# Patient Record
Sex: Female | Born: 1935 | Race: White | Hispanic: No | Smoking: Former smoker
Health system: Southern US, Community
[De-identification: ages and names within clinical notes are randomized; demographics above are authoritative.]

## PROBLEM LIST (undated history)

## (undated) DIAGNOSIS — I82402 Acute embolism and thrombosis of unspecified deep veins of left lower extremity: Secondary | ICD-10-CM

## (undated) DIAGNOSIS — R590 Localized enlarged lymph nodes: Secondary | ICD-10-CM

## (undated) DIAGNOSIS — K219 Gastro-esophageal reflux disease without esophagitis: Secondary | ICD-10-CM

## (undated) DIAGNOSIS — H9319 Tinnitus, unspecified ear: Secondary | ICD-10-CM

## (undated) DIAGNOSIS — M8580 Other specified disorders of bone density and structure, unspecified site: Secondary | ICD-10-CM

## (undated) DIAGNOSIS — E785 Hyperlipidemia, unspecified: Secondary | ICD-10-CM

## (undated) DIAGNOSIS — F329 Major depressive disorder, single episode, unspecified: Secondary | ICD-10-CM

## (undated) DIAGNOSIS — Z0000 Encounter for general adult medical examination without abnormal findings: Secondary | ICD-10-CM

## (undated) DIAGNOSIS — R32 Unspecified urinary incontinence: Secondary | ICD-10-CM

## (undated) DIAGNOSIS — R030 Elevated blood-pressure reading, without diagnosis of hypertension: Secondary | ICD-10-CM

## (undated) DIAGNOSIS — I10 Essential (primary) hypertension: Secondary | ICD-10-CM

## (undated) DIAGNOSIS — G5600 Carpal tunnel syndrome, unspecified upper limb: Secondary | ICD-10-CM

## (undated) DIAGNOSIS — L929 Granulomatous disorder of the skin and subcutaneous tissue, unspecified: Secondary | ICD-10-CM

## (undated) DIAGNOSIS — K5790 Diverticulosis of intestine, part unspecified, without perforation or abscess without bleeding: Secondary | ICD-10-CM

## (undated) DIAGNOSIS — M5126 Other intervertebral disc displacement, lumbar region: Secondary | ICD-10-CM

## (undated) DIAGNOSIS — I2699 Other pulmonary embolism without acute cor pulmonale: Secondary | ICD-10-CM

## (undated) DIAGNOSIS — R351 Nocturia: Secondary | ICD-10-CM

## (undated) DIAGNOSIS — M1990 Unspecified osteoarthritis, unspecified site: Secondary | ICD-10-CM

## (undated) DIAGNOSIS — K5730 Diverticulosis of large intestine without perforation or abscess without bleeding: Secondary | ICD-10-CM

## (undated) DIAGNOSIS — Z8619 Personal history of other infectious and parasitic diseases: Secondary | ICD-10-CM

## (undated) DIAGNOSIS — I700 Atherosclerosis of aorta: Secondary | ICD-10-CM

## (undated) DIAGNOSIS — E039 Hypothyroidism, unspecified: Secondary | ICD-10-CM

## (undated) HISTORY — PX: BREAST SURGERY: SHX581

## (undated) HISTORY — DX: Other specified disorders of bone density and structure, unspecified site: M85.80

## (undated) HISTORY — DX: Major depressive disorder, single episode, unspecified: F32.9

## (undated) HISTORY — PX: APPENDECTOMY: SHX54

## (undated) HISTORY — DX: Essential (primary) hypertension: I10

## (undated) HISTORY — DX: Nocturia: R35.1

## (undated) HISTORY — DX: Personal history of other infectious and parasitic diseases: Z86.19

## (undated) HISTORY — DX: Carpal tunnel syndrome, unspecified upper limb: G56.00

## (undated) HISTORY — DX: Diverticulosis of large intestine without perforation or abscess without bleeding: K57.30

## (undated) HISTORY — DX: Unspecified osteoarthritis, unspecified site: M19.90

## (undated) HISTORY — DX: Gastro-esophageal reflux disease without esophagitis: K21.9

## (undated) HISTORY — DX: Diverticulosis of intestine, part unspecified, without perforation or abscess without bleeding: K57.90

## (undated) HISTORY — DX: Hypothyroidism, unspecified: E03.9

## (undated) HISTORY — DX: Tinnitus, unspecified ear: H93.19

## (undated) HISTORY — DX: Other intervertebral disc displacement, lumbar region: M51.26

## (undated) HISTORY — DX: Hyperlipidemia, unspecified: E78.5

## (undated) HISTORY — DX: Unspecified urinary incontinence: R32

## (undated) HISTORY — DX: Granulomatous disorder of the skin and subcutaneous tissue, unspecified: L92.9

## (undated) HISTORY — PX: DILATION AND CURETTAGE OF UTERUS: SHX78

## (undated) HISTORY — DX: Encounter for general adult medical examination without abnormal findings: Z00.00

## (undated) HISTORY — DX: Elevated blood-pressure reading, without diagnosis of hypertension: R03.0

---
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
## 1978-04-22 HISTORY — PX: ABDOMINAL HYSTERECTOMY: SHX81

## 1998-11-03 ENCOUNTER — Other Ambulatory Visit: Admission: RE | Admit: 1998-11-03 | Discharge: 1998-11-03 | Payer: Self-pay | Admitting: Geriatric Medicine

## 1999-05-03 ENCOUNTER — Encounter: Admission: RE | Admit: 1999-05-03 | Discharge: 1999-05-03 | Payer: Self-pay | Admitting: Geriatric Medicine

## 1999-05-03 ENCOUNTER — Encounter: Payer: Self-pay | Admitting: Geriatric Medicine

## 2000-05-21 ENCOUNTER — Encounter: Payer: Self-pay | Admitting: Geriatric Medicine

## 2000-05-21 ENCOUNTER — Encounter: Admission: RE | Admit: 2000-05-21 | Discharge: 2000-05-21 | Payer: Self-pay | Admitting: Geriatric Medicine

## 2000-12-10 ENCOUNTER — Encounter: Payer: Self-pay | Admitting: Geriatric Medicine

## 2000-12-10 ENCOUNTER — Encounter: Admission: RE | Admit: 2000-12-10 | Discharge: 2000-12-10 | Payer: Self-pay | Admitting: Geriatric Medicine

## 2001-04-09 ENCOUNTER — Ambulatory Visit (HOSPITAL_COMMUNITY): Admission: RE | Admit: 2001-04-09 | Discharge: 2001-04-09 | Payer: Self-pay | Admitting: Gastroenterology

## 2001-05-28 ENCOUNTER — Encounter: Payer: Self-pay | Admitting: Geriatric Medicine

## 2001-05-28 ENCOUNTER — Encounter: Admission: RE | Admit: 2001-05-28 | Discharge: 2001-05-28 | Payer: Self-pay | Admitting: Geriatric Medicine

## 2001-07-21 DIAGNOSIS — F32A Depression, unspecified: Secondary | ICD-10-CM

## 2001-07-21 HISTORY — DX: Depression, unspecified: F32.A

## 2001-07-22 ENCOUNTER — Other Ambulatory Visit: Admission: RE | Admit: 2001-07-22 | Discharge: 2001-07-22 | Payer: Self-pay | Admitting: Geriatric Medicine

## 2001-07-28 ENCOUNTER — Encounter: Payer: Self-pay | Admitting: Geriatric Medicine

## 2001-07-28 ENCOUNTER — Encounter: Admission: RE | Admit: 2001-07-28 | Discharge: 2001-07-28 | Payer: Self-pay | Admitting: Geriatric Medicine

## 2001-11-09 ENCOUNTER — Encounter: Admission: RE | Admit: 2001-11-09 | Discharge: 2001-11-26 | Payer: Self-pay | Admitting: Orthopedic Surgery

## 2002-04-22 HISTORY — PX: KNEE SURGERY: SHX244

## 2002-05-31 ENCOUNTER — Encounter: Payer: Self-pay | Admitting: Geriatric Medicine

## 2002-05-31 ENCOUNTER — Encounter: Admission: RE | Admit: 2002-05-31 | Discharge: 2002-05-31 | Payer: Self-pay | Admitting: Geriatric Medicine

## 2003-06-08 ENCOUNTER — Ambulatory Visit (HOSPITAL_COMMUNITY): Admission: RE | Admit: 2003-06-08 | Discharge: 2003-06-08 | Payer: Self-pay | Admitting: Geriatric Medicine

## 2004-05-09 ENCOUNTER — Encounter: Admission: RE | Admit: 2004-05-09 | Discharge: 2004-05-09 | Payer: Self-pay | Admitting: Geriatric Medicine

## 2004-05-17 ENCOUNTER — Encounter: Admission: RE | Admit: 2004-05-17 | Discharge: 2004-05-17 | Payer: Self-pay | Admitting: Geriatric Medicine

## 2005-05-24 ENCOUNTER — Encounter: Admission: RE | Admit: 2005-05-24 | Discharge: 2005-05-24 | Payer: Self-pay | Admitting: Geriatric Medicine

## 2005-06-13 ENCOUNTER — Encounter: Admission: RE | Admit: 2005-06-13 | Discharge: 2005-06-13 | Payer: Self-pay | Admitting: Geriatric Medicine

## 2006-05-27 ENCOUNTER — Encounter: Admission: RE | Admit: 2006-05-27 | Discharge: 2006-05-27 | Payer: Self-pay | Admitting: Geriatric Medicine

## 2006-12-09 ENCOUNTER — Encounter
Admission: RE | Admit: 2006-12-09 | Discharge: 2007-03-09 | Payer: Self-pay | Admitting: Physical Medicine and Rehabilitation

## 2007-05-29 ENCOUNTER — Encounter: Admission: RE | Admit: 2007-05-29 | Discharge: 2007-05-29 | Payer: Self-pay | Admitting: Geriatric Medicine

## 2008-04-22 HISTORY — PX: CATARACT EXTRACTION: SUR2

## 2008-05-30 ENCOUNTER — Encounter: Admission: RE | Admit: 2008-05-30 | Discharge: 2008-05-30 | Payer: Self-pay | Admitting: Geriatric Medicine

## 2009-05-31 ENCOUNTER — Encounter: Admission: RE | Admit: 2009-05-31 | Discharge: 2009-05-31 | Payer: Self-pay | Admitting: Geriatric Medicine

## 2010-05-12 ENCOUNTER — Encounter: Payer: Self-pay | Admitting: Geriatric Medicine

## 2010-05-12 ENCOUNTER — Other Ambulatory Visit: Payer: Self-pay | Admitting: Geriatric Medicine

## 2010-05-12 DIAGNOSIS — Z9289 Personal history of other medical treatment: Secondary | ICD-10-CM

## 2010-05-12 DIAGNOSIS — Z1231 Encounter for screening mammogram for malignant neoplasm of breast: Secondary | ICD-10-CM

## 2010-06-01 ENCOUNTER — Ambulatory Visit
Admission: RE | Admit: 2010-06-01 | Discharge: 2010-06-01 | Disposition: A | Discharge status: Home / Self Care | Payer: Medicare Other | Source: Ambulatory Visit | Attending: Geriatric Medicine | Admitting: Geriatric Medicine

## 2010-06-01 DIAGNOSIS — Z1231 Encounter for screening mammogram for malignant neoplasm of breast: Secondary | ICD-10-CM

## 2010-07-17 ENCOUNTER — Ambulatory Visit: Payer: Medicare Other | Admitting: Internal Medicine

## 2010-07-17 ENCOUNTER — Ambulatory Visit (HOSPITAL_BASED_OUTPATIENT_CLINIC_OR_DEPARTMENT_OTHER)
Admission: RE | Admit: 2010-07-17 | Discharge: 2010-07-17 | Disposition: A | Discharge status: Home / Self Care | Payer: Medicare Other | Source: Ambulatory Visit | Attending: Internal Medicine | Admitting: Internal Medicine

## 2010-07-17 ENCOUNTER — Other Ambulatory Visit: Payer: Self-pay | Admitting: Internal Medicine

## 2010-07-17 DIAGNOSIS — M899 Disorder of bone, unspecified: Secondary | ICD-10-CM

## 2010-07-17 DIAGNOSIS — F849 Pervasive developmental disorder, unspecified: Secondary | ICD-10-CM

## 2010-07-17 DIAGNOSIS — E041 Nontoxic single thyroid nodule: Secondary | ICD-10-CM

## 2010-07-17 DIAGNOSIS — M949 Disorder of cartilage, unspecified: Secondary | ICD-10-CM

## 2010-07-17 DIAGNOSIS — E785 Hyperlipidemia, unspecified: Secondary | ICD-10-CM

## 2010-07-26 ENCOUNTER — Ambulatory Visit: Payer: Medicare Other | Admitting: Internal Medicine

## 2010-07-26 ENCOUNTER — Other Ambulatory Visit: Payer: Self-pay | Admitting: Internal Medicine

## 2010-07-26 DIAGNOSIS — K210 Gastro-esophageal reflux disease with esophagitis: Secondary | ICD-10-CM

## 2010-07-26 DIAGNOSIS — M899 Disorder of bone, unspecified: Secondary | ICD-10-CM

## 2010-07-26 DIAGNOSIS — E039 Hypothyroidism, unspecified: Secondary | ICD-10-CM

## 2010-07-26 DIAGNOSIS — Z23 Encounter for immunization: Secondary | ICD-10-CM

## 2010-07-26 DIAGNOSIS — M949 Disorder of cartilage, unspecified: Secondary | ICD-10-CM

## 2010-07-26 DIAGNOSIS — E785 Hyperlipidemia, unspecified: Secondary | ICD-10-CM

## 2010-07-31 ENCOUNTER — Ambulatory Visit
Admission: RE | Admit: 2010-07-31 | Discharge: 2010-07-31 | Disposition: A | Discharge status: Home / Self Care | Payer: Medicare Other | Source: Ambulatory Visit | Attending: Internal Medicine | Admitting: Internal Medicine

## 2010-07-31 DIAGNOSIS — M8580 Other specified disorders of bone density and structure, unspecified site: Secondary | ICD-10-CM

## 2010-08-14 ENCOUNTER — Ambulatory Visit: Payer: Medicare Other | Attending: Internal Medicine | Admitting: Physical Therapy

## 2010-08-14 DIAGNOSIS — M6281 Muscle weakness (generalized): Secondary | ICD-10-CM | POA: Insufficient documentation

## 2010-08-14 DIAGNOSIS — R293 Abnormal posture: Secondary | ICD-10-CM | POA: Insufficient documentation

## 2010-08-14 DIAGNOSIS — R262 Difficulty in walking, not elsewhere classified: Secondary | ICD-10-CM | POA: Insufficient documentation

## 2010-08-14 DIAGNOSIS — IMO0001 Reserved for inherently not codable concepts without codable children: Secondary | ICD-10-CM | POA: Insufficient documentation

## 2010-08-20 ENCOUNTER — Ambulatory Visit: Payer: Medicare Other | Admitting: Internal Medicine

## 2010-08-22 ENCOUNTER — Ambulatory Visit: Payer: Medicare Other | Attending: Internal Medicine | Admitting: Physical Therapy

## 2010-08-22 DIAGNOSIS — R262 Difficulty in walking, not elsewhere classified: Secondary | ICD-10-CM | POA: Insufficient documentation

## 2010-08-22 DIAGNOSIS — R293 Abnormal posture: Secondary | ICD-10-CM | POA: Insufficient documentation

## 2010-08-22 DIAGNOSIS — M6281 Muscle weakness (generalized): Secondary | ICD-10-CM | POA: Insufficient documentation

## 2010-08-22 DIAGNOSIS — IMO0001 Reserved for inherently not codable concepts without codable children: Secondary | ICD-10-CM | POA: Insufficient documentation

## 2010-08-27 ENCOUNTER — Ambulatory Visit (INDEPENDENT_AMBULATORY_CARE_PROVIDER_SITE_OTHER): Payer: Medicare Other | Admitting: Internal Medicine

## 2010-08-27 DIAGNOSIS — R32 Unspecified urinary incontinence: Secondary | ICD-10-CM

## 2010-08-27 DIAGNOSIS — M899 Disorder of bone, unspecified: Secondary | ICD-10-CM

## 2010-08-27 DIAGNOSIS — E041 Nontoxic single thyroid nodule: Secondary | ICD-10-CM

## 2010-08-29 ENCOUNTER — Ambulatory Visit: Payer: Medicare Other | Admitting: Physical Therapy

## 2010-09-05 ENCOUNTER — Ambulatory Visit: Payer: Medicare Other | Admitting: Physical Therapy

## 2010-09-10 ENCOUNTER — Ambulatory Visit: Payer: Medicare Other | Admitting: Physical Therapy

## 2010-09-10 ENCOUNTER — Ambulatory Visit: Payer: Medicare Other | Attending: Internal Medicine | Admitting: Physical Therapy

## 2010-09-10 DIAGNOSIS — M629 Disorder of muscle, unspecified: Secondary | ICD-10-CM | POA: Insufficient documentation

## 2010-09-10 DIAGNOSIS — M2420 Disorder of ligament, unspecified site: Secondary | ICD-10-CM | POA: Insufficient documentation

## 2010-09-10 DIAGNOSIS — IMO0001 Reserved for inherently not codable concepts without codable children: Secondary | ICD-10-CM | POA: Insufficient documentation

## 2010-09-12 ENCOUNTER — Encounter: Payer: Medicare Other | Admitting: Physical Therapy

## 2010-09-21 ENCOUNTER — Ambulatory Visit: Payer: Medicare Other | Attending: Internal Medicine | Admitting: Physical Therapy

## 2010-09-21 DIAGNOSIS — M2420 Disorder of ligament, unspecified site: Secondary | ICD-10-CM | POA: Insufficient documentation

## 2010-09-21 DIAGNOSIS — IMO0001 Reserved for inherently not codable concepts without codable children: Secondary | ICD-10-CM | POA: Insufficient documentation

## 2010-09-21 DIAGNOSIS — M629 Disorder of muscle, unspecified: Secondary | ICD-10-CM | POA: Insufficient documentation

## 2010-09-24 ENCOUNTER — Ambulatory Visit: Payer: Medicare Other | Admitting: Physical Therapy

## 2010-10-01 ENCOUNTER — Ambulatory Visit: Payer: Medicare Other | Admitting: Physical Therapy

## 2010-10-08 ENCOUNTER — Ambulatory Visit: Payer: Medicare Other | Admitting: Physical Therapy

## 2010-10-15 ENCOUNTER — Ambulatory Visit: Payer: Medicare Other | Admitting: Physical Therapy

## 2010-10-22 ENCOUNTER — Ambulatory Visit: Payer: Medicare Other | Attending: Internal Medicine | Admitting: Physical Therapy

## 2010-10-22 DIAGNOSIS — IMO0001 Reserved for inherently not codable concepts without codable children: Secondary | ICD-10-CM | POA: Insufficient documentation

## 2010-10-22 DIAGNOSIS — M629 Disorder of muscle, unspecified: Secondary | ICD-10-CM | POA: Insufficient documentation

## 2010-10-22 DIAGNOSIS — M2420 Disorder of ligament, unspecified site: Secondary | ICD-10-CM | POA: Insufficient documentation

## 2010-10-29 ENCOUNTER — Ambulatory Visit: Payer: Medicare Other | Admitting: Physical Therapy

## 2010-11-07 ENCOUNTER — Ambulatory Visit: Payer: Medicare Other | Admitting: Physical Therapy

## 2011-01-01 ENCOUNTER — Encounter: Payer: Self-pay | Admitting: Emergency Medicine

## 2011-01-29 ENCOUNTER — Telehealth: Payer: Self-pay | Admitting: Emergency Medicine

## 2011-01-29 MED ORDER — LEVOTHYROXINE SODIUM 88 MCG PO TABS: 88.0000 ug | ORAL_TABLET | Freq: Every day | ORAL | Status: DC

## 2011-01-29 NOTE — Telephone Encounter (Signed)
Kylie Walters called, requesting refill of levothyroxine.  She states her pharmacy was supposed to send refill request.  I advised her we had not received, but I would refill.  She is out of medication, the pharmacy gave her 2 tabs until medication refilled.  Meds refilled, escribed to [facility]

## 2011-02-26 ENCOUNTER — Other Ambulatory Visit: Payer: Self-pay | Admitting: Emergency Medicine

## 2011-02-26 DIAGNOSIS — E039 Hypothyroidism, unspecified: Secondary | ICD-10-CM

## 2011-02-26 MED ORDER — LEVOTHYROXINE SODIUM 88 MCG PO TABS: 88.0000 ug | ORAL_TABLET | Freq: Every day | ORAL | Status: DC

## 2011-02-26 NOTE — Telephone Encounter (Signed)
Kylie Walters would like to get refills on her levothyroxine.  She would like to take advantage of [facility]'s $15 generic program where she can get a 120 day supply of her medication for $15.  Requests her levothyroxine be refilled for 120 days

## 2011-05-21 ENCOUNTER — Other Ambulatory Visit: Payer: Self-pay | Admitting: Geriatric Medicine

## 2011-05-21 ENCOUNTER — Other Ambulatory Visit: Payer: Self-pay | Admitting: Internal Medicine

## 2011-05-21 DIAGNOSIS — Z1231 Encounter for screening mammogram for malignant neoplasm of breast: Secondary | ICD-10-CM

## 2011-06-18 ENCOUNTER — Ambulatory Visit
Admission: RE | Admit: 2011-06-18 | Discharge: 2011-06-18 | Disposition: A | Discharge status: Home / Self Care | Payer: Medicare Other | Source: Ambulatory Visit | Attending: Internal Medicine | Admitting: Internal Medicine

## 2011-06-18 DIAGNOSIS — Z1231 Encounter for screening mammogram for malignant neoplasm of breast: Secondary | ICD-10-CM

## 2011-10-07 ENCOUNTER — Ambulatory Visit (INDEPENDENT_AMBULATORY_CARE_PROVIDER_SITE_OTHER): Payer: Medicare Other | Admitting: Internal Medicine

## 2011-10-07 ENCOUNTER — Encounter: Payer: Self-pay | Admitting: Internal Medicine

## 2011-10-07 ENCOUNTER — Ambulatory Visit (HOSPITAL_BASED_OUTPATIENT_CLINIC_OR_DEPARTMENT_OTHER)
Admission: RE | Admit: 2011-10-07 | Discharge: 2011-10-07 | Disposition: A | Discharge status: Home / Self Care | Payer: Medicare Other | Source: Ambulatory Visit | Attending: Internal Medicine | Admitting: Internal Medicine

## 2011-10-07 VITALS — BP 130/68 | HR 60 | Temp 97.7°F | Resp 16 | Ht 64.5 in | Wt 159.0 lb

## 2011-10-07 DIAGNOSIS — E041 Nontoxic single thyroid nodule: Secondary | ICD-10-CM | POA: Insufficient documentation

## 2011-10-07 DIAGNOSIS — E559 Vitamin D deficiency, unspecified: Secondary | ICD-10-CM | POA: Diagnosis not present

## 2011-10-07 DIAGNOSIS — K5730 Diverticulosis of large intestine without perforation or abscess without bleeding: Secondary | ICD-10-CM

## 2011-10-07 DIAGNOSIS — M899 Disorder of bone, unspecified: Secondary | ICD-10-CM

## 2011-10-07 DIAGNOSIS — K5792 Diverticulitis of intestine, part unspecified, without perforation or abscess without bleeding: Secondary | ICD-10-CM

## 2011-10-07 DIAGNOSIS — M8580 Other specified disorders of bone density and structure, unspecified site: Secondary | ICD-10-CM | POA: Insufficient documentation

## 2011-10-07 DIAGNOSIS — K5732 Diverticulitis of large intestine without perforation or abscess without bleeding: Secondary | ICD-10-CM

## 2011-10-07 DIAGNOSIS — E785 Hyperlipidemia, unspecified: Secondary | ICD-10-CM | POA: Insufficient documentation

## 2011-10-07 DIAGNOSIS — J8410 Pulmonary fibrosis, unspecified: Secondary | ICD-10-CM

## 2011-10-07 DIAGNOSIS — M1990 Unspecified osteoarthritis, unspecified site: Secondary | ICD-10-CM | POA: Insufficient documentation

## 2011-10-07 DIAGNOSIS — IMO0002 Reserved for concepts with insufficient information to code with codable children: Secondary | ICD-10-CM | POA: Insufficient documentation

## 2011-10-07 DIAGNOSIS — Z803 Family history of malignant neoplasm of breast: Secondary | ICD-10-CM

## 2011-10-07 DIAGNOSIS — K219 Gastro-esophageal reflux disease without esophagitis: Secondary | ICD-10-CM | POA: Insufficient documentation

## 2011-10-07 DIAGNOSIS — E049 Nontoxic goiter, unspecified: Secondary | ICD-10-CM | POA: Diagnosis not present

## 2011-10-07 DIAGNOSIS — E039 Hypothyroidism, unspecified: Secondary | ICD-10-CM | POA: Insufficient documentation

## 2011-10-07 DIAGNOSIS — Z90710 Acquired absence of both cervix and uterus: Secondary | ICD-10-CM

## 2011-10-07 DIAGNOSIS — H9190 Unspecified hearing loss, unspecified ear: Secondary | ICD-10-CM | POA: Insufficient documentation

## 2011-10-07 DIAGNOSIS — Z8659 Personal history of other mental and behavioral disorders: Secondary | ICD-10-CM | POA: Insufficient documentation

## 2011-10-07 DIAGNOSIS — M949 Disorder of cartilage, unspecified: Secondary | ICD-10-CM | POA: Diagnosis not present

## 2011-10-07 HISTORY — DX: Diverticulosis of large intestine without perforation or abscess without bleeding: K57.30

## 2011-10-07 LAB — POCT URINALYSIS DIPSTICK
Glucose, UA: NEGATIVE
Leukocytes, UA: NEGATIVE
Spec Grav, UA: 1.01
Urobilinogen, UA: >=8

## 2011-10-07 MED ORDER — SULFAMETHOXAZOLE-TRIMETHOPRIM 800-160 MG PO TABS: 1.0000 | ORAL_TABLET | Freq: Two times a day (BID) | ORAL | Status: AC

## 2011-10-07 NOTE — Patient Instructions (Addendum)
Labs will be mailed to you 

## 2011-10-07 NOTE — Progress Notes (Signed)
Subjective:    Patient ID: Kylie Walters, female    DOB: Mar 23, 1925, 78 y.o.   MRN: [id number]  HPI Kylie Walters is here for comprehensive eval.  Overall doing well but blowing yellow mucous out of her nose.  Occasional cough white mucous.  She is not using flonase regularly.  No fever no chest pain no SOB. She does have itchy eyes but they are better now.    She repeatedly declines colonoscopy and states that she would not want any treatment for colon cancer if it was found.    She is UTD with mammogram.  She reports she is not taking ASA daily  Allergies  Allergen Reactions  . Lipitor (Atorvastatin Calcium) Other (See Comments)    Myalgia    Past Medical History  Diagnosis Date  . Hypothyroidism   . Osteopenia   . Diverticulosis   . Granuloma of skin     RLL  . Vitamin d deficiency   . GERD (gastroesophageal reflux disease)   . Hyperlipidemia   . Hearing loss   . Arthritis   . Depression 4/03  . Lumbar herniated disc    Past Surgical History  Procedure Date  . Tonsillectomy and adenoidectomy 1939  . Dilation and curettage of uterus 1954, 1978, 1979  . Abdominal hysterectomy 1980  . Knee surgery 2004    left knee  . Cataract extraction 2010  . Breast surgery     L breast cyst   History   Social History  . Marital Status: Widowed    Spouse Name: N/A    Number of Children: N/A  . Years of Education: N/A   Occupational History  . Not on file.   Social History Main Topics  . Smoking status: Former Smoker    Quit date: 04/23/1979  . Smokeless tobacco: Not on file  . Alcohol Use: Yes     socially  . Drug Use: No  . Sexually Active: Not Currently   Other Topics Concern  . Not on file   Social History Narrative  . No narrative on file   Family History  Problem Relation Age of Onset  . Breast cancer Mother     metastatic disease  . Kidney disease Father     Bright's disease  . Cancer Maternal Grandfather     prostate   There is no problem list on file  for this patient.  Current Outpatient Prescriptions on File Prior to Visit  Medication Sig Dispense Refill  . fluticasone (FLONASE) 50 MCG/ACT nasal spray Place 1 spray into the nose daily as needed.        Marland Kitchen ibuprofen (ADVIL,MOTRIN) 200 MG tablet Take 400 mg by mouth every 6 (six) hours as needed.        Marland Kitchen levothyroxine (SYNTHROID, LEVOTHROID) 88 MCG tablet Take 1 tablet (88 mcg total) by mouth daily.  120 tablet  2  . aspirin 81 MG tablet Take 81 mg by mouth daily.        . Calcium Carbonate-Vitamin D (CALCIUM 500 + D PO) Take 1 tablet by mouth daily.        . Cholecalciferol (VITAMIN D3) 2000 UNITS TABS Take 1 tablet by mouth daily.        Marland Kitchen CRANBERRY EXTRACT PO Take 1 tablet by mouth daily.        . Multiple Vitamin (MULTIVITAMIN) tablet Take 1 tablet by mouth daily.        Marland Kitchen nystatin-triamcinolone (MYCOLOG II) cream Apply 1 application topically  2 (two) times daily as needed.              Review of Systems See HPI    Objective:   Physical Exam Physical Exam  Nursing note and vitals reviewed.  Constitutional: She is oriented to person, place, and time. She appears well-developed and well-nourished.  HENT:  Head: Normocephalic and atraumatic.  Right Ear: Tympanic membrane and ear canal normal. No drainage. Tympanic membrane is not injected and not erythematous.  Left Ear: Tympanic membrane and ear canal normal. No drainage. Tympanic membrane is not injected and not erythematous.  Nose: Nose normal. Right sinus exhibits no maxillary sinus tenderness and no frontal sinus tenderness. Left sinus exhibits no maxillary sinus tenderness and no frontal sinus tenderness.  Mouth/Throat: Oropharynx is clear and moist. No oral lesions. No oropharyngeal exudate.  Eyes: Conjunctivae and EOM are normal. Pupils are equal, round, and reactive to light.  Neck: Normal range of motion. Neck supple. No JVD present. Carotid bruit is not present. No mass and no thyromegaly present.  Cardiovascular:  Normal rate, regular rhythm, S1 normal, S2 normal and intact distal pulses. Exam reveals no gallop and no friction rub.  No murmur heard.  Pulses:  Carotid pulses are 2+ on the right side, and 2+ on the left side.  Dorsalis pedis pulses are 2+ on the right side, and 2+ on the left side.  No carotid bruit. No LE edema  Pulmonary/Chest: Breath sounds normal. She has no wheezes. She has no rales. She exhibits no tenderness.   Breast  No discrete masses no nipple discharge no axillary adenopathy bilaterally. Abdominal: Soft. Bowel sounds are normal. She exhibits no distension and no mass. There is no hepatosplenomegaly. There is no tenderness. There is no CVA tenderness.   Rectal no mass guaiac neg. Musculoskeletal: Normal range of motion.  No active synovitis to joints.  Lymphadenopathy:  She has no cervical adenopathy.  She has no axillary adenopathy.  Right: No inguinal and no supraclavicular adenopathy present.  Left: No inguinal and no supraclavicular adenopathy present.  Neurological: She is alert and oriented to person, place, and time. She has normal strength and normal reflexes. She displays no tremor. No cranial nerve deficit or sensory deficit. Coordination and gait normal.  Skin: Skin is warm and dry. No rash noted. No cyanosis. Nails show no clubbing.  Psychiatric: She has a normal mood and affect. Her speech is normal and behavior is normal. Cognition and memory are normal.        Assessment & Plan:  Hyppothyroidism:  Will check TSH today   Thyroid cyst  L side:  Will get repeat ultrasound today  Sinusitis/ allegic rhinitis  Will give Bactrim DS i tab bid 7 days, advised to use Flonase  Hyperlipdemia  Recheck today.  Advised to take 81 mg ASA daily or every other day.  Osteopenia  Recheck  DEXA  next year  Advised Calcium and vitamin D  Diverticulosis  Vitamin D deficinecy will check today  GERD    DJD  Allergic rhinitis  Pt repeatedly declines colonoscopy despite  counseld and Decline Zoster vaccine.  UTD with mammogram    I spent 45 minutes with this pt

## 2011-10-08 ENCOUNTER — Telehealth: Payer: Self-pay | Admitting: *Deleted

## 2011-10-08 LAB — LIPID PANEL
Cholesterol: 205 mg/dL — ABNORMAL HIGH (ref 0–200)
Total CHOL/HDL Ratio: 4.3 Ratio
Triglycerides: 96 mg/dL (ref <150)
VLDL: 19 mg/dL (ref 0–40)

## 2011-10-08 LAB — COMPREHENSIVE METABOLIC PANEL
CO2: 25 mEq/L (ref 19–32)
Calcium: 9.4 mg/dL (ref 8.4–10.5)
Chloride: 107 mEq/L (ref 96–112)
Creat: 0.59 mg/dL (ref 0.50–1.10)
Glucose, Bld: 86 mg/dL (ref 70–99)
Total Bilirubin: 0.5 mg/dL (ref 0.3–1.2)
Total Protein: 6.1 g/dL (ref 6.0–8.3)

## 2011-10-08 LAB — CBC WITH DIFFERENTIAL/PLATELET
Eosinophils Absolute: 0.1 10*3/uL (ref 0.0–0.7)
Eosinophils Relative: 2 % (ref 0–5)
HCT: 41.8 % (ref 36.0–46.0)
Hemoglobin: 13.6 g/dL (ref 12.0–15.0)
Lymphocytes Relative: 42 % (ref 12–46)
Lymphs Abs: 2.5 10*3/uL (ref 0.7–4.0)
MCH: 29.3 pg (ref 26.0–34.0)
MCV: 90.1 fL (ref 78.0–100.0)
Monocytes Relative: 9 % (ref 3–12)
RBC: 4.64 MIL/uL (ref 3.87–5.11)

## 2011-10-08 LAB — VITAMIN D 25 HYDROXY (VIT D DEFICIENCY, FRACTURES): Vit D, 25-Hydroxy: 28 ng/mL — ABNORMAL LOW (ref 30–89)

## 2011-10-08 NOTE — Telephone Encounter (Signed)
Pt notified of thyroid scan being stable and she does want a referral to an endocrinologist.

## 2011-10-09 ENCOUNTER — Telehealth: Payer: Self-pay | Admitting: Internal Medicine

## 2011-10-09 ENCOUNTER — Telehealth: Payer: Self-pay | Admitting: *Deleted

## 2011-10-09 DIAGNOSIS — E039 Hypothyroidism, unspecified: Secondary | ICD-10-CM

## 2011-10-09 NOTE — Telephone Encounter (Signed)
Copy of labs along with a copy of [facility] diet mailed to pt's home address.

## 2011-11-04 ENCOUNTER — Encounter: Payer: Self-pay | Admitting: *Deleted

## 2011-11-13 NOTE — Telephone Encounter (Signed)
error 

## 2011-12-02 DIAGNOSIS — E039 Hypothyroidism, unspecified: Secondary | ICD-10-CM | POA: Diagnosis not present

## 2011-12-02 DIAGNOSIS — E041 Nontoxic single thyroid nodule: Secondary | ICD-10-CM | POA: Diagnosis not present

## 2012-01-05 ENCOUNTER — Encounter: Payer: Self-pay | Admitting: Internal Medicine

## 2012-01-08 ENCOUNTER — Other Ambulatory Visit: Payer: Self-pay | Admitting: Internal Medicine

## 2012-01-08 NOTE — Telephone Encounter (Signed)
Pt needs refill on Fluticasone Propionate (Suspension) FLONASE 50 MCG/ACT Place 1 spray into the nose daily as needed... The one she has already expired on 08/ 2013...   [facility] to [facility]... Pt stopped by on 12/29/11 @ 305 pm...   She will back on 01/13/2012 to get her flu shot... Ad

## 2012-01-09 ENCOUNTER — Other Ambulatory Visit: Payer: Self-pay | Admitting: Internal Medicine

## 2012-01-09 MED ORDER — FLUTICASONE PROPIONATE 50 MCG/ACT NA SUSP: 1.0000 | Freq: Every day | NASAL | Status: DC | PRN

## 2012-01-09 NOTE — Telephone Encounter (Signed)
Pt requesting refill of nasal spray

## 2012-01-13 ENCOUNTER — Ambulatory Visit (INDEPENDENT_AMBULATORY_CARE_PROVIDER_SITE_OTHER): Payer: Medicare Other | Admitting: Internal Medicine

## 2012-01-13 ENCOUNTER — Ambulatory Visit: Payer: Medicare Other

## 2012-01-13 VITALS — Temp 97.0°F

## 2012-01-13 DIAGNOSIS — Z23 Encounter for immunization: Secondary | ICD-10-CM

## 2012-02-28 DIAGNOSIS — H43399 Other vitreous opacities, unspecified eye: Secondary | ICD-10-CM | POA: Diagnosis not present

## 2012-03-02 ENCOUNTER — Other Ambulatory Visit: Payer: Self-pay | Admitting: *Deleted

## 2012-03-02 ENCOUNTER — Ambulatory Visit (INDEPENDENT_AMBULATORY_CARE_PROVIDER_SITE_OTHER): Payer: Medicare Other | Admitting: Internal Medicine

## 2012-03-02 ENCOUNTER — Encounter: Payer: Self-pay | Admitting: Internal Medicine

## 2012-03-02 VITALS — BP 168/74 | HR 59 | Temp 97.0°F | Resp 18 | Wt 159.0 lb

## 2012-03-02 DIAGNOSIS — H6590 Unspecified nonsuppurative otitis media, unspecified ear: Secondary | ICD-10-CM

## 2012-03-02 DIAGNOSIS — J029 Acute pharyngitis, unspecified: Secondary | ICD-10-CM | POA: Diagnosis not present

## 2012-03-02 MED ORDER — AZITHROMYCIN 250 MG PO TABS: ORAL_TABLET | ORAL | Status: DC

## 2012-03-02 NOTE — Progress Notes (Signed)
Subjective:    Patient ID: Kylie Walters, female    DOB: [birth year], 78 y.o.   MRN: [id number]  HPI Kylie Walters is here for acute visit.  She has had a sore throat for several days.  No fever no chest pain occasional dry cough  Allergies  Allergen Reactions  . Lipitor (Atorvastatin Calcium) Other (See Comments)    Myalgia    Past Medical History  Diagnosis Date  . Hypothyroidism   . Osteopenia   . Diverticulosis   . Granuloma of skin     RLL  . Vitamin D deficiency   . GERD (gastroesophageal reflux disease)   . Hyperlipidemia   . Hearing loss   . Arthritis   . Depression 4/03  . Lumbar herniated disc    Past Surgical History  Procedure Date  . Tonsillectomy and adenoidectomy 1939  . Dilation and curettage of uterus 1954, 1978, 1979  . Abdominal hysterectomy 1980  . Knee surgery 2004    left knee  . Cataract extraction 2010  . Breast surgery     L breast cyst   History   Social History  . Marital Status: Widowed    Spouse Name: N/A    Number of Children: N/A  . Years of Education: N/A   Occupational History  . Not on file.   Social History Main Topics  . Smoking status: Former Smoker    Quit date: 04/23/1979  . Smokeless tobacco: Not on file  . Alcohol Use: Yes     Comment: socially  . Drug Use: No  . Sexually Active: Not Currently   Other Topics Concern  . Not on file   Social History Narrative  . No narrative on file   Family History  Problem Relation Age of Onset  . Breast cancer Mother     metastatic disease  . Kidney disease Father     Bright's disease  . Cancer Maternal Grandfather     prostate   Patient Active Problem List  Diagnosis  . Unspecified hypothyroidism  . Osteopenia  . Diverticulitis  . Lung granuloma  . Vitamin d deficiency  . GERD (gastroesophageal reflux disease)  . Other and unspecified hyperlipidemia  . Hearing loss  . DJD (degenerative joint disease)  . History of depression  . Disc herniation  . H/O hysterectomy  with oophorectomy  . Family history of breast cancer in first degree relative  . Thyroid cyst   Current Outpatient Prescriptions on File Prior to Visit  Medication Sig Dispense Refill  . Calcium Carbonate-Vitamin D (CALCIUM 500 + D PO) Take 1 tablet by mouth daily.        . Cholecalciferol (VITAMIN D3) 2000 UNITS TABS Take 1 tablet by mouth daily.        . fluticasone (FLONASE) 50 MCG/ACT nasal spray Place 1 spray into the nose daily as needed.  16 g  1  . ibuprofen (ADVIL,MOTRIN) 200 MG tablet Take 400 mg by mouth every 6 (six) hours as needed.        Marland Kitchen levothyroxine (SYNTHROID, LEVOTHROID) 88 MCG tablet Take 1 tablet (88 mcg total) by mouth daily.  120 tablet  2  . Multiple Vitamin (MULTIVITAMIN) tablet Take 1 tablet by mouth daily.        Marland Kitchen aspirin 81 MG tablet Take 81 mg by mouth daily.        Marland Kitchen CRANBERRY EXTRACT PO Take 1 tablet by mouth daily.        Marland Kitchen nystatin-triamcinolone (MYCOLOG II)  cream Apply 1 application topically 2 (two) times daily as needed.             Review of Systems See HPI    Objective:   Physical Exam Physical Exam  Constitutional: She is oriented to person, place, and time. She appears well-developed and well-nourished. She is cooperative.  HENT:  Head: Normocephalic and atraumatic.  Right Ear: A middle ear effusion is present.  Left Ear: A middle ear effusion is present.  Nose: Mucosal edema present.  Mouth/Throat: Oropharyngeal exudate and posterior oropharyngeal erythema present.  Serous effusion bilaterally  Eyes: Conjunctivae and EOM are normal. Pupils are equal, round, and reactive to light.  Neck: Neck supple. Carotid bruit is not present. No mass present.  Cardiovascular: Regular rhythm, normal heart sounds, intact distal pulses and normal pulses. Exam reveals no gallop and no friction rub.  No murmur heard.  Pulmonary/Chest: Breath sounds normal. She has no wheezes. She has no rhonchi. She has no rales.  Lymphadenopathy:  She has cervical  adenopathy.  Neurological: She is alert and oriented to person, place, and time.  Skin: Skin is warm and dry. No abrasion, no bruising, no ecchymosis and no rash noted. No cyanosis. Nails show no clubbing.  Psychiatric: She has a normal mood and affect. Her speech is normal and behavior is normal.            Assessment & Plan:  Pharyngitis  Will give Zpak   Congestion  Ok to take Mucinex otc  I adcised to do CXR but pt declines due to expense  Serous effusion  Continue flonase  Pt counseled if no improvement will need ENT eval.  She voices understanding and is to call office if not better

## 2012-03-02 NOTE — Patient Instructions (Addendum)
See me as needed 

## 2012-03-09 ENCOUNTER — Telehealth: Payer: Self-pay | Admitting: Internal Medicine

## 2012-03-09 NOTE — Telephone Encounter (Signed)
Pt was calling to inform Dr. Reece Levy and Karen Kitchens that she has finished taking her antibiotic.Marland Kitchen She does not feel bad, but everything still the same... For example her throat still has the yellowish stuff inside... Please call pt at [phone].Marland KitchenMarland Kitchen

## 2012-03-09 NOTE — Telephone Encounter (Signed)
Called pt regarding her sx pt is scheduled to see MD

## 2012-03-10 ENCOUNTER — Encounter: Payer: Self-pay | Admitting: Internal Medicine

## 2012-03-10 ENCOUNTER — Ambulatory Visit (INDEPENDENT_AMBULATORY_CARE_PROVIDER_SITE_OTHER): Payer: Medicare Other | Admitting: Internal Medicine

## 2012-03-10 VITALS — BP 128/70 | HR 66 | Temp 97.1°F | Resp 18 | Wt 157.0 lb

## 2012-03-10 DIAGNOSIS — S0300XA Dislocation of jaw, unspecified side, initial encounter: Secondary | ICD-10-CM

## 2012-03-10 DIAGNOSIS — M26629 Arthralgia of temporomandibular joint, unspecified side: Secondary | ICD-10-CM | POA: Diagnosis not present

## 2012-03-10 DIAGNOSIS — R6884 Jaw pain: Secondary | ICD-10-CM

## 2012-03-10 DIAGNOSIS — H9209 Otalgia, unspecified ear: Secondary | ICD-10-CM | POA: Diagnosis not present

## 2012-03-10 NOTE — Progress Notes (Signed)
Subjective:    Patient ID: Kylie Walters, female    DOB: 04-15-1925, 78 y.o.   MRN: [id number]  HPI Kylie Walters is here for follow up  She states she has been having pain in L TMJ area and still has nasal  Congestion.  She is feeling somewhat better but would like to have an ENT evaluation.  No fever or headache  She has finished her Zpak  Taking an occasional mucinex D  Allergies  Allergen Reactions  . Lipitor (Atorvastatin Calcium) Other (See Comments)    Myalgia    Past Medical History  Diagnosis Date  . Hypothyroidism   . Osteopenia   . Diverticulosis   . Granuloma of skin     RLL  . Vitamin D deficiency   . GERD (gastroesophageal reflux disease)   . Hyperlipidemia   . Hearing loss   . Arthritis   . Depression 4/03  . Lumbar herniated disc    Past Surgical History  Procedure Date  . Tonsillectomy and adenoidectomy 1939  . Dilation and curettage of uterus 1954, 1978, 1979  . Abdominal hysterectomy 1980  . Knee surgery 2004    left knee  . Cataract extraction 2010  . Breast surgery     L breast cyst   History   Social History  . Marital Status: Widowed    Spouse Name: N/A    Number of Children: N/A  . Years of Education: N/A   Occupational History  . Not on file.   Social History Main Topics  . Smoking status: Former Smoker    Quit date: 04/23/1979  . Smokeless tobacco: Not on file  . Alcohol Use: Yes     Comment: socially  . Drug Use: No  . Sexually Active: Not Currently   Other Topics Concern  . Not on file   Social History Narrative  . No narrative on file   Family History  Problem Relation Age of Onset  . Breast cancer Mother     metastatic disease  . Kidney disease Father     Bright's disease  . Cancer Maternal Grandfather     prostate   Patient Active Problem List  Diagnosis  . Unspecified hypothyroidism  . Osteopenia  . Diverticulitis  . Lung granuloma  . Vitamin d deficiency  . GERD (gastroesophageal reflux disease)  . Other and  unspecified hyperlipidemia  . Hearing loss  . DJD (degenerative joint disease)  . History of depression  . Disc herniation  . H/O hysterectomy with oophorectomy  . Family history of breast cancer in first degree relative  . Thyroid cyst  . TMJ arthralgia   Current Outpatient Prescriptions on File Prior to Visit  Medication Sig Dispense Refill  . aspirin 81 MG tablet Take 81 mg by mouth daily.        . Calcium Carbonate-Vitamin D (CALCIUM 500 + D PO) Take 1 tablet by mouth daily.        . Cholecalciferol (VITAMIN D3) 2000 UNITS TABS Take 1 tablet by mouth daily.        Marland Kitchen CRANBERRY EXTRACT PO Take 1 tablet by mouth daily.        . fluticasone (FLONASE) 50 MCG/ACT nasal spray Place 1 spray into the nose daily as needed.  16 g  1  . ibuprofen (ADVIL,MOTRIN) 200 MG tablet Take 400 mg by mouth every 6 (six) hours as needed.        Marland Kitchen levothyroxine (SYNTHROID, LEVOTHROID) 88 MCG tablet Take 1 tablet (  88 mcg total) by mouth daily.  120 tablet  2  . Multiple Vitamin (MULTIVITAMIN) tablet Take 1 tablet by mouth daily.        Marland Kitchen nystatin-triamcinolone (MYCOLOG II) cream Apply 1 application topically 2 (two) times daily as needed.             Review of Systems    see HPI Objective:   Physical Exam Physical Exam  Constitutional: She is oriented to person, place, and time. She appears well-developed and well-nourished. She is cooperative.  HENT:  Head: Normocephalic and atraumatic.  Right Ear: A middle ear effusion is present.  Left Ear: A middle ear effusion is present.  Nose: Mucosal edema present. Right sinus exhibits maxillary sinus tenderness. Left sinus exhibits maxillary sinus tenderness.  Mouth/Throat: Posterior oropharyngeal erythema present.  Serous effusion bilaterally  Slight TMJ clicking with movement Eyes: Conjunctivae and EOM are normal. Pupils are equal, round, and reactive to light.  Neck: Neck supple. Carotid bruit is not present. No mass present.  Cardiovascular: Regular  rhythm, normal heart sounds, intact distal pulses and normal pulses. Exam reveals no gallop and no friction rub.  No murmur heard.  Pulmonary/Chest: Breath sounds normal. She has no wheezes. She has no rhonchi. She has no rales.  Neurological: She is alert and oriented to person, place, and time.  Skin: Skin is warm and dry. No abrasion, no bruising, no ecchymosis and no rash noted. No cyanosis. Nails show no clubbing.  Psychiatric: She has a normal mood and affect. Her speech is normal and behavior is normal.             Assessment & Plan:  Eustachian tube dysfunction  Continue Mucinex D  OK for ENT eval  Possible TMJ

## 2012-03-13 DIAGNOSIS — K219 Gastro-esophageal reflux disease without esophagitis: Secondary | ICD-10-CM | POA: Diagnosis not present

## 2012-03-13 DIAGNOSIS — R0982 Postnasal drip: Secondary | ICD-10-CM | POA: Diagnosis not present

## 2012-04-02 ENCOUNTER — Other Ambulatory Visit: Payer: Self-pay | Admitting: *Deleted

## 2012-04-02 DIAGNOSIS — E039 Hypothyroidism, unspecified: Secondary | ICD-10-CM

## 2012-04-02 MED ORDER — LEVOTHYROXINE SODIUM 88 MCG PO TABS: 88.0000 ug | ORAL_TABLET | Freq: Every day | ORAL | Status: DC

## 2012-04-02 NOTE — Telephone Encounter (Signed)
Refill request

## 2012-05-04 ENCOUNTER — Telehealth: Payer: Self-pay | Admitting: Internal Medicine

## 2012-05-04 NOTE — Telephone Encounter (Signed)
Pt requesting #[phone] because it is much cheaper it is to be sent to [facility]

## 2012-05-04 NOTE — Telephone Encounter (Signed)
Pt request a call from the nurse to talk about a prescription.. She did not say what prescription (sorry).. Please call pt at [phone]

## 2012-05-08 ENCOUNTER — Other Ambulatory Visit: Payer: Self-pay | Admitting: Otolaryngology

## 2012-05-08 DIAGNOSIS — R0982 Postnasal drip: Secondary | ICD-10-CM

## 2012-05-11 ENCOUNTER — Ambulatory Visit
Admission: RE | Admit: 2012-05-11 | Discharge: 2012-05-11 | Disposition: A | Discharge status: Home / Self Care | Payer: Medicare Other | Source: Ambulatory Visit | Attending: Otolaryngology | Admitting: Otolaryngology

## 2012-05-11 DIAGNOSIS — J3489 Other specified disorders of nose and nasal sinuses: Secondary | ICD-10-CM | POA: Diagnosis not present

## 2012-05-11 DIAGNOSIS — R0982 Postnasal drip: Secondary | ICD-10-CM

## 2012-05-13 ENCOUNTER — Other Ambulatory Visit: Payer: Self-pay | Admitting: Internal Medicine

## 2012-05-13 DIAGNOSIS — Z1231 Encounter for screening mammogram for malignant neoplasm of breast: Secondary | ICD-10-CM

## 2012-06-18 ENCOUNTER — Ambulatory Visit (HOSPITAL_BASED_OUTPATIENT_CLINIC_OR_DEPARTMENT_OTHER)
Admission: RE | Admit: 2012-06-18 | Discharge: 2012-06-18 | Disposition: A | Discharge status: Home / Self Care | Payer: Medicare Other | Source: Ambulatory Visit | Attending: Internal Medicine | Admitting: Internal Medicine

## 2012-06-18 DIAGNOSIS — Z1231 Encounter for screening mammogram for malignant neoplasm of breast: Secondary | ICD-10-CM | POA: Diagnosis not present

## 2012-10-08 ENCOUNTER — Telehealth: Payer: Self-pay | Admitting: *Deleted

## 2012-10-08 DIAGNOSIS — M25549 Pain in joints of unspecified hand: Secondary | ICD-10-CM | POA: Diagnosis not present

## 2012-10-22 DIAGNOSIS — G5600 Carpal tunnel syndrome, unspecified upper limb: Secondary | ICD-10-CM | POA: Diagnosis not present

## 2012-11-05 DIAGNOSIS — G5600 Carpal tunnel syndrome, unspecified upper limb: Secondary | ICD-10-CM | POA: Diagnosis not present

## 2012-11-16 ENCOUNTER — Telehealth: Payer: Self-pay | Admitting: *Deleted

## 2012-11-16 ENCOUNTER — Ambulatory Visit (HOSPITAL_BASED_OUTPATIENT_CLINIC_OR_DEPARTMENT_OTHER)
Admission: RE | Admit: 2012-11-16 | Discharge: 2012-11-16 | Disposition: A | Discharge status: Home / Self Care | Payer: Medicare Other | Source: Ambulatory Visit | Attending: Internal Medicine | Admitting: Internal Medicine

## 2012-11-16 ENCOUNTER — Other Ambulatory Visit: Payer: Self-pay | Admitting: Internal Medicine

## 2012-11-16 ENCOUNTER — Ambulatory Visit (INDEPENDENT_AMBULATORY_CARE_PROVIDER_SITE_OTHER): Payer: Medicare Other | Admitting: Internal Medicine

## 2012-11-16 ENCOUNTER — Encounter: Payer: Self-pay | Admitting: Internal Medicine

## 2012-11-16 VITALS — BP 128/74 | HR 57 | Temp 97.8°F | Resp 16 | Wt 141.0 lb

## 2012-11-16 DIAGNOSIS — I700 Atherosclerosis of aorta: Secondary | ICD-10-CM | POA: Diagnosis not present

## 2012-11-16 DIAGNOSIS — M8580 Other specified disorders of bone density and structure, unspecified site: Secondary | ICD-10-CM

## 2012-11-16 DIAGNOSIS — M949 Disorder of cartilage, unspecified: Secondary | ICD-10-CM | POA: Diagnosis not present

## 2012-11-16 DIAGNOSIS — I2510 Atherosclerotic heart disease of native coronary artery without angina pectoris: Secondary | ICD-10-CM | POA: Insufficient documentation

## 2012-11-16 DIAGNOSIS — R001 Bradycardia, unspecified: Secondary | ICD-10-CM | POA: Insufficient documentation

## 2012-11-16 DIAGNOSIS — R918 Other nonspecific abnormal finding of lung field: Secondary | ICD-10-CM | POA: Diagnosis not present

## 2012-11-16 DIAGNOSIS — E785 Hyperlipidemia, unspecified: Secondary | ICD-10-CM

## 2012-11-16 DIAGNOSIS — M899 Disorder of bone, unspecified: Secondary | ICD-10-CM

## 2012-11-16 DIAGNOSIS — I498 Other specified cardiac arrhythmias: Secondary | ICD-10-CM

## 2012-11-16 DIAGNOSIS — F17200 Nicotine dependence, unspecified, uncomplicated: Secondary | ICD-10-CM | POA: Insufficient documentation

## 2012-11-16 DIAGNOSIS — N6009 Solitary cyst of unspecified breast: Secondary | ICD-10-CM | POA: Diagnosis not present

## 2012-11-16 DIAGNOSIS — Z139 Encounter for screening, unspecified: Secondary | ICD-10-CM

## 2012-11-16 DIAGNOSIS — M1990 Unspecified osteoarthritis, unspecified site: Secondary | ICD-10-CM | POA: Diagnosis not present

## 2012-11-16 DIAGNOSIS — Z720 Tobacco use: Secondary | ICD-10-CM

## 2012-11-16 DIAGNOSIS — J984 Other disorders of lung: Secondary | ICD-10-CM | POA: Diagnosis not present

## 2012-11-16 DIAGNOSIS — E559 Vitamin D deficiency, unspecified: Secondary | ICD-10-CM

## 2012-11-16 DIAGNOSIS — Z0000 Encounter for general adult medical examination without abnormal findings: Secondary | ICD-10-CM

## 2012-11-16 DIAGNOSIS — R9389 Abnormal findings on diagnostic imaging of other specified body structures: Secondary | ICD-10-CM

## 2012-11-16 DIAGNOSIS — IMO0002 Reserved for concepts with insufficient information to code with codable children: Secondary | ICD-10-CM

## 2012-11-16 DIAGNOSIS — I517 Cardiomegaly: Secondary | ICD-10-CM | POA: Insufficient documentation

## 2012-11-16 DIAGNOSIS — E039 Hypothyroidism, unspecified: Secondary | ICD-10-CM

## 2012-11-16 DIAGNOSIS — J8410 Pulmonary fibrosis, unspecified: Secondary | ICD-10-CM

## 2012-11-16 LAB — CBC WITH DIFFERENTIAL/PLATELET
Eosinophils Relative: 2 % (ref 0–5)
Lymphocytes Relative: 34 % (ref 12–46)
Lymphs Abs: 2.3 10*3/uL (ref 0.7–4.0)
MCV: 86.3 fL (ref 78.0–100.0)
Neutro Abs: 3.5 10*3/uL (ref 1.7–7.7)
Neutrophils Relative %: 52 % (ref 43–77)
Platelets: 368 10*3/uL (ref 150–400)
RBC: 4.32 MIL/uL (ref 3.87–5.11)
WBC: 6.8 10*3/uL (ref 4.0–10.5)

## 2012-11-16 LAB — COMPREHENSIVE METABOLIC PANEL
ALT: 10 U/L (ref 0–35)
AST: 14 U/L (ref 0–37)
CO2: 28 mEq/L (ref 19–32)
Calcium: 9.9 mg/dL (ref 8.4–10.5)
Chloride: 104 mEq/L (ref 96–112)
Potassium: 4.3 mEq/L (ref 3.5–5.3)
Sodium: 139 mEq/L (ref 135–145)
Total Protein: 6.4 g/dL (ref 6.0–8.3)

## 2012-11-16 MED ORDER — DICLOFENAC SODIUM 1 % TD GEL: TRANSDERMAL | Status: DC

## 2012-11-16 NOTE — Progress Notes (Signed)
Subjective:    Patient ID: Kylie Walters, female    DOB: Feb 16, 1925, 79 y.o.   MRN: [id number]  HPI Kylie Walters is here for CPE  L thumb DJD/Carpal tunnel.  She had seen Dr. Madelon Lips who recommended surgical repair for L carpal tunnel.  She is hesitant to do this now.  She does have swelling and pain at L MCP joint with know DJD.   She has been using Alleve bid-tid.  No GI symptoms  She quite smoking at age 31 began at 42.  See Chest Ct of 2006  She has calcified granuloma in RLL  She repeatedly declines screening colonoscopy despite my counsel that she may miss and early Colon cancer.    Osteopenia  She take calcium and vitamin D  Allergies  Allergen Reactions  . Lipitor (Atorvastatin Calcium) Other (See Comments)    Myalgia    Past Medical History  Diagnosis Date  . Hypothyroidism   . Osteopenia   . Diverticulosis   . Granuloma of skin     RLL  . Vitamin D deficiency   . GERD (gastroesophageal reflux disease)   . Hyperlipidemia   . Hearing loss   . Arthritis   . Depression 4/03  . Lumbar herniated disc    Past Surgical History  Procedure Laterality Date  . Tonsillectomy and adenoidectomy  1939  . Dilation and curettage of uterus  1954, 1978, 1979  . Abdominal hysterectomy  1980  . Knee surgery  2004    left knee  . Cataract extraction  2010  . Breast surgery      L breast cyst   History   Social History  . Marital Status: Widowed    Spouse Name: N/A    Number of Children: N/A  . Years of Education: N/A   Occupational History  . Not on file.   Social History Main Topics  . Smoking status: Former Smoker    Quit date: 04/23/1979  . Smokeless tobacco: Not on file  . Alcohol Use: Yes     Comment: socially  . Drug Use: No  . Sexually Active: Not Currently   Other Topics Concern  . Not on file   Social History Narrative  . No narrative on file   Family History  Problem Relation Age of Onset  . Breast cancer Mother     metastatic disease  . Kidney  disease Father     Bright's disease  . Cancer Maternal Grandfather     prostate   Patient Active Problem List   Diagnosis Date Noted  . Tobacco use 11/16/2012  . Calcified granuloma of lung 11/16/2012  . Sinus bradycardia on ECG 11/16/2012  . TMJ arthralgia 03/10/2012  . Unspecified hypothyroidism 10/07/2011  . Osteopenia 10/07/2011  . Diverticulitis 10/07/2011  . Lung granuloma 10/07/2011  . Vitamin d deficiency 10/07/2011  . GERD (gastroesophageal reflux disease) 10/07/2011  . Other and unspecified hyperlipidemia 10/07/2011  . Hearing loss 10/07/2011  . DJD (degenerative joint disease) 10/07/2011  . History of depression 10/07/2011  . Disc herniation 10/07/2011  . H/O hysterectomy with oophorectomy 10/07/2011  . Family history of breast cancer in first degree relative 10/07/2011  . Thyroid cyst 10/07/2011   Current Outpatient Prescriptions on File Prior to Visit  Medication Sig Dispense Refill  . Calcium Carbonate-Vitamin D (CALCIUM 500 + D PO) Take 1 tablet by mouth daily.        . Cholecalciferol (VITAMIN D3) 2000 UNITS TABS Take 1 tablet by mouth daily.        Marland Kitchen  ibuprofen (ADVIL,MOTRIN) 200 MG tablet Take 400 mg by mouth every 6 (six) hours as needed.        Marland Kitchen levothyroxine (SYNTHROID, LEVOTHROID) 88 MCG tablet Take 1 tablet (88 mcg total) by mouth daily.  90 tablet  2  . Multiple Vitamin (MULTIVITAMIN) tablet Take 1 tablet by mouth daily.        Marland Kitchen aspirin 81 MG tablet Take 81 mg by mouth daily.        . fluticasone (FLONASE) 50 MCG/ACT nasal spray Place 1 spray into the nose daily as needed.  16 g  1  . nystatin-triamcinolone (MYCOLOG II) cream Apply 1 application topically 2 (two) times daily as needed.         No current facility-administered medications on file prior to visit.       Review of Systems See HPI    Objective:   Physical Exam Physical Exam  Nursing note and vitals reviewed.  Constitutional: She is oriented to person, place, and time. She appears  well-developed and well-nourished.  HENT:  Head: Normocephalic and atraumatic.  Right Ear: Tympanic membrane and ear canal normal. No drainage. Tympanic membrane is not injected and not erythematous.  Left Ear: Tympanic membrane and ear canal normal. No drainage. Tympanic membrane is not injected and not erythematous.  Nose: Nose normal. Right sinus exhibits no maxillary sinus tenderness and no frontal sinus tenderness. Left sinus exhibits no maxillary sinus tenderness and no frontal sinus tenderness.  Mouth/Throat: Oropharynx is clear and moist. No oral lesions. No oropharyngeal exudate.  Eyes: Conjunctivae and EOM are normal. Pupils are equal, round, and reactive to light.  Neck: Normal range of motion. Neck supple. No JVD present. Carotid bruit is not present. No mass and no thyromegaly present.  Cardiovascular: Normal rate, regular rhythm, S1 normal, S2 normal and intact distal pulses. Exam reveals no gallop and no friction rub.  No murmur heard.  Pulses:  Carotid pulses are 2+ on the right side, and 2+ on the left side.  Dorsalis pedis pulses are 2+ on the right side, and 2+ on the left side.  No carotid bruit. No LE edema  Pulmonary/Chest: Breath sounds normal. She has no wheezes. She has no rales. She exhibits no tenderness.  Breast  No discrete masses no nipple discharge no axillary adenopathy bilaterally Abdominal: Soft. Bowel sounds are normal. She exhibits no distension and no mass. There is no hepatosplenomegaly. There is no tenderness. There is no CVA tenderness.  Rectal guaiac neg Musculoskeletal: Normal range of motion.  No active synovitis to joints.   She does have swelling but  No redness of L MCP joint Lymphadenopathy:  She has no cervical adenopathy.  She has no axillary adenopathy.  Right: No inguinal and no supraclavicular adenopathy present.  Left: No inguinal and no supraclavicular adenopathy present.  Neurological: She is alert and oriented to person, place, and  time. She has normal strength and normal reflexes. She displays no tremor. No cranial nerve deficit or sensory deficit. Coordination and gait normal.  Skin: Skin is warm and dry. No rash noted. No cyanosis. Nails show no clubbing.  Psychiatric: She has a normal mood and affect. Her speech is normal and behavior is normal. Cognition and memory are normal.          Assessment & Plan:  Health maintenance  Declines Zostavax.  Declines colonoscopy despite my repeated counsel that this is necessary and may miss an early colon cancer or polyp.   DJD/L carpal tunnel.  Willl try Voltaren 1% gell to use to thumb BID.  She has not GI symptoms but I advised her to stop Alleve and she can have 2 tylenol BID if needed.  See me in 6 weeks.  If not better may need steroid injection versus surgery for her Carpal tunnel  Long term tobacco use.  Will schedule screening CT.    Hypothyroidism will check today  Osteopenia  Continue calcium/vitamin D  Will schedule DEXA  Later this year  RLL lung granuloma    Diveticulosis  GERD  Advised to stop oral NSAIDS   Hyperlipidemia will check today  History of depression  On meds now . Mood stable   See me in 6 weeks

## 2012-11-16 NOTE — Telephone Encounter (Signed)
Message copied by Mathews Robinsons on Mon Nov 16, 2012  3:54 PM ------      Message from: Raechel Chute D      Created: Mon Nov 16, 2012  1:51 PM       Karen Kitchens            Call pt and let her know that her Chest CT is stable no new findings ------

## 2012-11-16 NOTE — Telephone Encounter (Signed)
Pt notified of normal CT 

## 2012-11-16 NOTE — Patient Instructions (Addendum)
Voltaren gel  Apply 2 grams to thumb joint bid  See me in 6 weeks

## 2012-11-17 ENCOUNTER — Encounter: Payer: Self-pay | Admitting: *Deleted

## 2012-11-17 LAB — TSH: TSH: 0.446 u[IU]/mL (ref 0.350–4.500)

## 2012-11-18 ENCOUNTER — Encounter: Payer: Self-pay | Admitting: *Deleted

## 2012-11-19 ENCOUNTER — Encounter: Payer: Self-pay | Admitting: *Deleted

## 2012-11-24 ENCOUNTER — Telehealth: Payer: Self-pay | Admitting: *Deleted

## 2012-11-24 NOTE — Telephone Encounter (Signed)
Dr Constance Goltz,  What are your recommendations for this patient's complaint?

## 2012-11-24 NOTE — Telephone Encounter (Signed)
She needs 30 min OV.  

## 2012-11-24 NOTE — Telephone Encounter (Signed)
Pt needs appointment per Dr Constance Goltz

## 2012-11-24 NOTE — Telephone Encounter (Signed)
Kylie Walters called today to let us know that the medicine she was given for her pain in thumb and wrist is worse.  She is also having pain in her right elbow and neck.  She is wanting to know if all of this is arthritis instead of carpel tunnel.  She has an appt on 12/28/12, but she does not think she can take pain that long.

## 2012-11-25 ENCOUNTER — Encounter: Payer: Self-pay | Admitting: Internal Medicine

## 2012-11-25 ENCOUNTER — Ambulatory Visit (INDEPENDENT_AMBULATORY_CARE_PROVIDER_SITE_OTHER): Payer: Medicare Other | Admitting: Internal Medicine

## 2012-11-25 VITALS — BP 114/65 | HR 62 | Resp 14 | Ht 65.0 in | Wt 141.0 lb

## 2012-11-25 DIAGNOSIS — M1990 Unspecified osteoarthritis, unspecified site: Secondary | ICD-10-CM | POA: Diagnosis not present

## 2012-11-25 DIAGNOSIS — M2550 Pain in unspecified joint: Secondary | ICD-10-CM | POA: Diagnosis not present

## 2012-11-25 NOTE — Progress Notes (Addendum)
Subjective:    Patient ID: Kylie Walters, female    DOB: [birth year], 79 y.o.   MRN: [id number]  HPI  Kylie Walters is here for follow up.  She states her Voltaren gel is helping a little but she continues to have multiple arthralgias.  She as some intermittant numbness in her L arm and intermittant pain in knees.    Allergies  Allergen Reactions  . Lipitor (Atorvastatin Calcium) Other (See Comments)    Myalgia    Past Medical History  Diagnosis Date  . Hypothyroidism   . Osteopenia   . Diverticulosis   . Granuloma of skin     RLL  . Vitamin D deficiency   . GERD (gastroesophageal reflux disease)   . Hyperlipidemia   . Hearing loss   . Arthritis   . Depression 4/03  . Lumbar herniated disc    Past Surgical History  Procedure Laterality Date  . Tonsillectomy and adenoidectomy  1939  . Dilation and curettage of uterus  1954, 1978, 1979  . Abdominal hysterectomy  1980  . Knee surgery  2004    left knee  . Cataract extraction  2010  . Breast surgery      L breast cyst   History   Social History  . Marital Status: Widowed    Spouse Name: N/A    Number of Children: N/A  . Years of Education: N/A   Occupational History  . Not on file.   Social History Main Topics  . Smoking status: Former Smoker    Quit date: 04/23/1979  . Smokeless tobacco: Not on file  . Alcohol Use: Yes     Comment: socially  . Drug Use: No  . Sexually Active: Not Currently   Other Topics Concern  . Not on file   Social History Narrative  . No narrative on file   Family History  Problem Relation Age of Onset  . Breast cancer Mother     metastatic disease  . Kidney disease Father     Bright's disease  . Cancer Maternal Grandfather     prostate   Patient Active Problem List   Diagnosis Date Noted  . Tobacco use 11/16/2012  . Calcified granuloma of lung 11/16/2012  . Sinus bradycardia on ECG 11/16/2012  . TMJ arthralgia 03/10/2012  . Unspecified hypothyroidism 10/07/2011  . Osteopenia  10/07/2011  . Diverticulitis 10/07/2011  . Lung granuloma 10/07/2011  . Vitamin d deficiency 10/07/2011  . GERD (gastroesophageal reflux disease) 10/07/2011  . Other and unspecified hyperlipidemia 10/07/2011  . Hearing loss 10/07/2011  . DJD (degenerative joint disease) 10/07/2011  . History of depression 10/07/2011  . Disc herniation 10/07/2011  . H/O hysterectomy with oophorectomy 10/07/2011  . Family history of breast cancer in first degree relative 10/07/2011  . Thyroid cyst 10/07/2011   Current Outpatient Prescriptions on File Prior to Visit  Medication Sig Dispense Refill  . aspirin 81 MG tablet Take 81 mg by mouth daily.        . Calcium Carbonate-Vitamin D (CALCIUM 500 + D PO) Take 1 tablet by mouth daily.        . Cholecalciferol (VITAMIN D3) 2000 UNITS TABS Take 1 tablet by mouth daily.        . diclofenac sodium (VOLTAREN) 1 % GEL Apply 2 grams to L thumb joint  bid  1 Tube  0  . fluticasone (FLONASE) 50 MCG/ACT nasal spray Place 1 spray into the nose daily as needed.  16 g  1  .  ibuprofen (ADVIL,MOTRIN) 200 MG tablet Take 400 mg by mouth every 6 (six) hours as needed.        Marland Kitchen levothyroxine (SYNTHROID, LEVOTHROID) 88 MCG tablet Take 1 tablet (88 mcg total) by mouth daily.  90 tablet  2  . Multiple Vitamin (MULTIVITAMIN) tablet Take 1 tablet by mouth daily.        Marland Kitchen nystatin-triamcinolone (MYCOLOG II) cream Apply 1 application topically 2 (two) times daily as needed.         No current facility-administered medications on file prior to visit.     Review of Systems See HPI    Objective:   Physical Exam Physical Exam  Nursing note and vitals reviewed.  Constitutional: She is oriented to person, place, and time. She appears well-developed and well-nourished.  HENT:  Head: Normocephalic and atraumatic.  Cardiovascular: Normal rate and regular rhythm. Exam reveals no gallop and no friction rub.  No murmur heard.  Pulmonary/Chest: Breath sounds normal. She has no  wheezes. She has no rales.  Neurological: She is alert and oriented to person, place, and time.  Skin: Skin is warm and dry.  M/S  She does have DJD changes of both hands.  No pain with neck ROM.  No active synovitis  Psychiatric: She has a normal mood and affect. Her behavior is normal.             Assessment & Plan:  Arthralgias with paresthesias of arm L>R.   I explained to pt I was concerned about degenerative process in her neck that may be affecting the nerve root to her arms.  She repeatedly declines xray imaging .  I offered her referral to rheumatologist to which she is agreeable.  No changes to her medicine.  She can use tylenol bid and voltaren gell  Addendum  05/29/2013  See Dr. Reginia Forts note of 05/25/2013.  She has monoclonal IGG protein.  She was referred to hematology by Dr. Corliss Skains

## 2012-11-25 NOTE — Patient Instructions (Addendum)
Will refer to rheumatology.

## 2012-12-08 NOTE — Telephone Encounter (Signed)
error 

## 2012-12-10 ENCOUNTER — Other Ambulatory Visit: Payer: Self-pay | Admitting: *Deleted

## 2012-12-10 MED ORDER — DICLOFENAC SODIUM 1 % TD GEL: TRANSDERMAL | Status: DC

## 2012-12-10 NOTE — Telephone Encounter (Signed)
Pt needs refill unable to get an appt with specialist until 09/19

## 2012-12-28 ENCOUNTER — Ambulatory Visit: Payer: Medicare Other | Admitting: Internal Medicine

## 2013-01-08 DIAGNOSIS — Z111 Encounter for screening for respiratory tuberculosis: Secondary | ICD-10-CM | POA: Diagnosis not present

## 2013-01-08 DIAGNOSIS — M25549 Pain in joints of unspecified hand: Secondary | ICD-10-CM | POA: Diagnosis not present

## 2013-01-08 DIAGNOSIS — M25559 Pain in unspecified hip: Secondary | ICD-10-CM | POA: Diagnosis not present

## 2013-01-08 DIAGNOSIS — M2550 Pain in unspecified joint: Secondary | ICD-10-CM | POA: Diagnosis not present

## 2013-01-08 DIAGNOSIS — M25519 Pain in unspecified shoulder: Secondary | ICD-10-CM | POA: Diagnosis not present

## 2013-01-08 DIAGNOSIS — M19049 Primary osteoarthritis, unspecified hand: Secondary | ICD-10-CM | POA: Diagnosis not present

## 2013-01-08 DIAGNOSIS — R5383 Other fatigue: Secondary | ICD-10-CM | POA: Diagnosis not present

## 2013-01-08 DIAGNOSIS — R5381 Other malaise: Secondary | ICD-10-CM | POA: Diagnosis not present

## 2013-01-26 DIAGNOSIS — Z23 Encounter for immunization: Secondary | ICD-10-CM | POA: Diagnosis not present

## 2013-02-03 DIAGNOSIS — M25519 Pain in unspecified shoulder: Secondary | ICD-10-CM | POA: Diagnosis not present

## 2013-02-03 DIAGNOSIS — M25559 Pain in unspecified hip: Secondary | ICD-10-CM | POA: Diagnosis not present

## 2013-02-03 DIAGNOSIS — M25549 Pain in joints of unspecified hand: Secondary | ICD-10-CM | POA: Diagnosis not present

## 2013-02-03 DIAGNOSIS — M5137 Other intervertebral disc degeneration, lumbosacral region: Secondary | ICD-10-CM | POA: Diagnosis not present

## 2013-02-14 ENCOUNTER — Telehealth: Payer: Self-pay | Admitting: Internal Medicine

## 2013-02-14 NOTE — Telephone Encounter (Signed)
Left message on pt home  Number to call office on Monday regarding lab results

## 2013-02-16 ENCOUNTER — Telehealth: Payer: Self-pay | Admitting: Internal Medicine

## 2013-02-16 DIAGNOSIS — E039 Hypothyroidism, unspecified: Secondary | ICD-10-CM

## 2013-02-16 MED ORDER — LEVOTHYROXINE SODIUM 88 MCG PO TABS: ORAL_TABLET | ORAL | Status: DC

## 2013-02-16 NOTE — Telephone Encounter (Signed)
Spoke with pt .  TSH at Dr. Corliss Skains office was 0.22  ADvised to take her synthroid  88 mcg 1 tab M-F and 1/2 tab on Sat and Sunday  She voices understanding  See scanned labs

## 2013-02-18 ENCOUNTER — Encounter: Payer: Self-pay | Admitting: *Deleted

## 2013-04-20 DIAGNOSIS — H04129 Dry eye syndrome of unspecified lacrimal gland: Secondary | ICD-10-CM | POA: Diagnosis not present

## 2013-05-24 ENCOUNTER — Telehealth: Payer: Self-pay | Admitting: *Deleted

## 2013-05-24 NOTE — Telephone Encounter (Signed)
Pt wants a referral to audiologist. States that she was seen by someone who sent a flyer in the mail and was not happy

## 2013-05-25 ENCOUNTER — Other Ambulatory Visit: Payer: Self-pay | Admitting: Internal Medicine

## 2013-05-25 DIAGNOSIS — Z1231 Encounter for screening mammogram for malignant neoplasm of breast: Secondary | ICD-10-CM

## 2013-05-25 DIAGNOSIS — M25519 Pain in unspecified shoulder: Secondary | ICD-10-CM | POA: Diagnosis not present

## 2013-05-25 DIAGNOSIS — M5137 Other intervertebral disc degeneration, lumbosacral region: Secondary | ICD-10-CM | POA: Diagnosis not present

## 2013-05-25 DIAGNOSIS — M19049 Primary osteoarthritis, unspecified hand: Secondary | ICD-10-CM | POA: Diagnosis not present

## 2013-05-25 NOTE — Telephone Encounter (Signed)
Give her the number to [facility] and she can call and make appt with the audiologist that they use.  I do not think she will need a referral from [place]

## 2013-05-29 ENCOUNTER — Encounter: Payer: Self-pay | Admitting: Internal Medicine

## 2013-05-29 DIAGNOSIS — M1990 Unspecified osteoarthritis, unspecified site: Secondary | ICD-10-CM | POA: Insufficient documentation

## 2013-05-29 DIAGNOSIS — D472 Monoclonal gammopathy: Secondary | ICD-10-CM | POA: Insufficient documentation

## 2013-06-07 ENCOUNTER — Telehealth: Payer: Self-pay | Admitting: Hematology & Oncology

## 2013-06-07 NOTE — Telephone Encounter (Signed)
Left vm w NEW PATIENT today to remind them of their appointment with Dr. Ennever. Also, advised them to bring all meds and insurance information. ° °

## 2013-06-09 ENCOUNTER — Encounter: Payer: Self-pay | Admitting: Hematology & Oncology

## 2013-06-09 ENCOUNTER — Ambulatory Visit: Payer: Medicare Other

## 2013-06-09 ENCOUNTER — Ambulatory Visit (HOSPITAL_BASED_OUTPATIENT_CLINIC_OR_DEPARTMENT_OTHER): Payer: Medicare Other | Admitting: Lab

## 2013-06-09 ENCOUNTER — Ambulatory Visit (HOSPITAL_BASED_OUTPATIENT_CLINIC_OR_DEPARTMENT_OTHER): Payer: Medicare Other | Admitting: Hematology & Oncology

## 2013-06-09 VITALS — BP 147/48 | HR 57 | Temp 97.5°F | Resp 14 | Ht 64.0 in | Wt 144.0 lb

## 2013-06-09 DIAGNOSIS — D472 Monoclonal gammopathy: Secondary | ICD-10-CM

## 2013-06-09 LAB — CBC WITH DIFFERENTIAL (CANCER CENTER ONLY)
BASO#: 0 10*3/uL (ref 0.0–0.2)
BASO%: 0.1 % (ref 0.0–2.0)
EOS%: 2.5 % (ref 0.0–7.0)
Eosinophils Absolute: 0.2 10*3/uL (ref 0.0–0.5)
HEMATOCRIT: 41.8 % (ref 34.8–46.6)
HEMOGLOBIN: 13.4 g/dL (ref 11.6–15.9)
LYMPH#: 2.5 10*3/uL (ref 0.9–3.3)
LYMPH%: 37.4 % (ref 14.0–48.0)
MCH: 28.6 pg (ref 26.0–34.0)
MCHC: 32.1 g/dL (ref 32.0–36.0)
MCV: 89 fL (ref 81–101)
MONO#: 0.7 10*3/uL (ref 0.1–0.9)
MONO%: 10.7 % (ref 0.0–13.0)
NEUT#: 3.3 10*3/uL (ref 1.5–6.5)
NEUT%: 49.3 % (ref 39.6–80.0)
Platelets: 229 10*3/uL (ref 145–400)
RBC: 4.69 10*6/uL (ref 3.70–5.32)
RDW: 14 % (ref 11.1–15.7)
WBC: 6.8 10*3/uL (ref 3.9–10.0)

## 2013-06-09 LAB — CHCC SATELLITE - SMEAR

## 2013-06-09 NOTE — Progress Notes (Signed)
Referral MD  Reason for Referral: monoclonal gammopathy  Chief Complaint  Patient presents with  . New Patient  : my doctor told me Ihad to come here for my blood  HPI: Kylie Walters is a very nice 80 year old white female. She's been in good health. She is seen by Dr. Horris Latino  And was referred to rheumatology. I suppose this was because of arthritis. At the rheumatologist, labs were done and not surprising, she had a small monoclonal spike of 0.25 g/dL. 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 As such, she was referred to hematology to make sure she did not have myeloma.  Kylie Walters feels great. She is originally from [place] had no problems..she's been down here for over 50 years. There's been some surgery but no complications.   She has had no infections.there's been no bleeding. He's had no swelling. There's been no weight loss or weight gain. She eats well. She has not had any rashes. She had a typical battery of rheumatology test without they were abnormal.  Kylie Walters, herself, did not think she had a problem but out of courtesy to her doctor gave her visit.   Past Medical History  Diagnosis Date  . Hypothyroidism   . Osteopenia   . Diverticulosis   . Granuloma of skin     RLL  . Vitamin D deficiency   . GERD (gastroesophageal reflux disease)   . Hyperlipidemia   . Hearing loss   . Arthritis   . Depression 4/03  . Lumbar herniated disc   :  Past Surgical History  Procedure Laterality Date  . Tonsillectomy and adenoidectomy  1939  . Dilation and curettage of uterus  1954, 1978, 1979  . Abdominal hysterectomy  1980  . Knee surgery  2004    left knee  . Cataract extraction  2010  . Breast surgery      L breast cyst  :  Current outpatient prescriptions:acetaminophen (TYLENOL) 500 MG tablet, Take 1,000 mg by mouth 3 (three) times daily as needed for pain (for pain)., Disp: , Rfl: ;  aspirin 81 MG tablet, Take 81 mg by mouth daily.  , Disp: , Rfl: ;  Calcium  Carbonate-Vitamin D (CALCIUM 500 + D PO), Take 1 tablet by mouth daily.  , Disp: , Rfl: ;  Cholecalciferol (VITAMIN D3) 2000 UNITS TABS, Take 1 tablet by mouth daily.  , Disp: , Rfl:  ibuprofen (ADVIL,MOTRIN) 200 MG tablet, Take 400 mg by mouth every 6 (six) hours as needed.  , Disp: , Rfl: ;  levothyroxine (SYNTHROID, LEVOTHROID) 88 MCG tablet, 88 mcg. Monday - Friday 88 MCG  Saturday AND Sunday 1/2 TAB OR 44 MCG., Disp: , Rfl: ;  Multiple Vitamin (MULTIVITAMIN) tablet, Take 1 tablet by mouth daily.  , Disp: , Rfl: :  :  Allergies  Allergen Reactions  . Lipitor [Atorvastatin Calcium] Other (See Comments)    Myalgia   :  Family History  Problem Relation Age of Onset  . Breast cancer Mother     metastatic disease  . Kidney disease Father     Bright's disease  . Cancer Maternal Grandfather     prostate  :  History   Social History  . Marital Status: Widowed    Spouse Name: N/A    Number of Children: N/A  . Years of Education: N/A   Occupational History  . Not on file.   Social History Main Topics  . Smoking  status: Former Smoker -- 0.50 packs/day for 40 years    Types: Cigarettes    Start date: 06/09/1940    Quit date: 04/23/1979  . Smokeless tobacco: Never Used     Comment: quit smoking 40 years ago  . Alcohol Use: Yes     Comment: socially  . Drug Use: No  . Sexual Activity: Not Currently   Other Topics Concern  . Not on file   Social History Narrative  . No narrative on file  :  Pertinent items are noted in HPI.  Exam: @[facility] @ Elderly white female who is thin. Vital signs show a temperature of 97.5. Pulse 57. Pulse 57. Blood pressure 147/48. Weight 144. Head and neck exam shows no ocular or oral lesions. She does no adenopathy. No scleral icterus is noted. Some temporal muscle wasting is appreciated. Thyroid nonpalpable. Lungs are clear. Cardiac exam regular in rhythm. No murmurs. Abdomen soft. No fluid wave. No palpable hepato- splenomegaly.  Back exam  some kyphosis. No tenderness. Extremities age related changes. Skin exam no rashes.   Recent Labs  06/09/13 1243  WBC 6.8  HGB 13.4  HCT 41.8  PLT 229   No results found for this basename: NA, K, CL, CO2, GLUCOSE, BUN, CREATININE, CALCIUM,  in the last 72 hours  Blood smear review: normochromic normocytic probably separate blood cells. No nucleated red cells are noted. No rouleau formation. White cells appear normal. No immature myeloid cells. No blasts.  Pathology:none    Assessment and Plan: Kylie Walters is an 80 year old white female. She is a minimal monoclonal spike. His to 0.25 g/dL. He was found to be an IgG kappa protein.  At her age, I would expect her to have an MGUS. It is not a common to see this in elderly patients.  I do not see anything anything that suggest myeloma. She has normal immunoglobulin levels.  I willwe'll not put her through a bunch of tests. She does not any x-ray test.she had a CAT scan of the chest last year. No bony abnormalities were noted.  I really don't think we have to see Kylie Walters back. I don't believe that she ever will have a problem with his MGUS. Again, at her age, is not surprising.  I don't see need for a bone marrow test.  I spent a good 45 minutes with her. It was nice talking to her about doing when. She probably will bethe healthiest person I see all day.

## 2013-06-11 LAB — PROTEIN ELECTROPHORESIS, SERUM, WITH REFLEX
Albumin ELP: 63.2 % (ref 55.8–66.1)
Alpha-1-Globulin: 3.8 % (ref 2.9–4.9)
Alpha-2-Globulin: 10.2 % (ref 7.1–11.8)
Beta 2: 3.9 % (ref 3.2–6.5)
Beta Globulin: 6.7 % (ref 4.7–7.2)
GAMMA GLOBULIN: 12.2 % (ref 11.1–18.8)
TOTAL PROTEIN, SERUM ELECTROPHOR: 6.3 g/dL (ref 6.0–8.3)

## 2013-06-11 LAB — COMPREHENSIVE METABOLIC PANEL
ALT: 10 U/L (ref 0–35)
AST: 16 U/L (ref 0–37)
Albumin: 4.3 g/dL (ref 3.5–5.2)
Alkaline Phosphatase: 59 U/L (ref 39–117)
BILIRUBIN TOTAL: 0.6 mg/dL (ref 0.2–1.2)
BUN: 14 mg/dL (ref 6–23)
CO2: 25 meq/L (ref 19–32)
Calcium: 9.6 mg/dL (ref 8.4–10.5)
Chloride: 107 mEq/L (ref 96–112)
Creatinine, Ser: 0.56 mg/dL (ref 0.50–1.10)
Glucose, Bld: 89 mg/dL (ref 70–99)
POTASSIUM: 4.1 meq/L (ref 3.5–5.3)
SODIUM: 141 meq/L (ref 135–145)
TOTAL PROTEIN: 6.3 g/dL (ref 6.0–8.3)

## 2013-06-11 LAB — IFE INTERPRETATION

## 2013-06-11 LAB — IGG, IGA, IGM
IgA: 150 mg/dL (ref 69–380)
IgG (Immunoglobin G), Serum: 802 mg/dL (ref 690–1700)
IgM, Serum: 97 mg/dL (ref 52–322)

## 2013-06-11 LAB — LACTATE DEHYDROGENASE: LDH: 154 U/L (ref 94–250)

## 2013-06-11 LAB — KAPPA/LAMBDA LIGHT CHAINS
KAPPA LAMBDA RATIO: 0.86 (ref 0.26–1.65)
Kappa free light chain: 1.21 mg/dL (ref 0.33–1.94)
Lambda Free Lght Chn: 1.4 mg/dL (ref 0.57–2.63)

## 2013-06-14 DIAGNOSIS — H903 Sensorineural hearing loss, bilateral: Secondary | ICD-10-CM | POA: Diagnosis not present

## 2013-06-14 DIAGNOSIS — H9319 Tinnitus, unspecified ear: Secondary | ICD-10-CM | POA: Diagnosis not present

## 2013-06-23 ENCOUNTER — Ambulatory Visit (HOSPITAL_BASED_OUTPATIENT_CLINIC_OR_DEPARTMENT_OTHER)
Admission: RE | Admit: 2013-06-23 | Discharge: 2013-06-23 | Disposition: A | Discharge status: Home / Self Care | Payer: Medicare Other | Source: Ambulatory Visit | Attending: Internal Medicine | Admitting: Internal Medicine

## 2013-06-23 DIAGNOSIS — Z1231 Encounter for screening mammogram for malignant neoplasm of breast: Secondary | ICD-10-CM | POA: Diagnosis not present

## 2013-09-14 DIAGNOSIS — H04129 Dry eye syndrome of unspecified lacrimal gland: Secondary | ICD-10-CM | POA: Diagnosis not present

## 2014-01-17 ENCOUNTER — Ambulatory Visit (INDEPENDENT_AMBULATORY_CARE_PROVIDER_SITE_OTHER): Payer: Medicare Other | Admitting: *Deleted

## 2014-01-17 DIAGNOSIS — Z23 Encounter for immunization: Secondary | ICD-10-CM

## 2014-02-21 ENCOUNTER — Encounter: Payer: Self-pay | Admitting: Hematology & Oncology

## 2014-03-22 ENCOUNTER — Other Ambulatory Visit: Payer: Self-pay | Admitting: *Deleted

## 2014-03-22 MED ORDER — LEVOTHYROXINE SODIUM 88 MCG PO TABS: 88.0000 ug | ORAL_TABLET | Freq: Every day | ORAL | Status: DC

## 2014-03-22 NOTE — Telephone Encounter (Signed)
Refill request

## 2014-05-10 ENCOUNTER — Other Ambulatory Visit: Payer: Self-pay | Admitting: *Deleted

## 2014-05-10 DIAGNOSIS — R5382 Chronic fatigue, unspecified: Secondary | ICD-10-CM

## 2014-05-10 DIAGNOSIS — Z0000 Encounter for general adult medical examination without abnormal findings: Secondary | ICD-10-CM

## 2014-05-10 DIAGNOSIS — E559 Vitamin D deficiency, unspecified: Secondary | ICD-10-CM

## 2014-05-11 ENCOUNTER — Other Ambulatory Visit: Payer: Self-pay | Admitting: Internal Medicine

## 2014-05-11 ENCOUNTER — Ambulatory Visit (HOSPITAL_BASED_OUTPATIENT_CLINIC_OR_DEPARTMENT_OTHER)
Admission: RE | Admit: 2014-05-11 | Discharge: 2014-05-11 | Disposition: A | Discharge status: Home / Self Care | Payer: Medicare Other | Source: Ambulatory Visit | Attending: Internal Medicine | Admitting: Internal Medicine

## 2014-05-11 ENCOUNTER — Encounter: Payer: Self-pay | Admitting: *Deleted

## 2014-05-11 ENCOUNTER — Ambulatory Visit (INDEPENDENT_AMBULATORY_CARE_PROVIDER_SITE_OTHER): Payer: Medicare Other | Admitting: Internal Medicine

## 2014-05-11 ENCOUNTER — Encounter: Payer: Self-pay | Admitting: Internal Medicine

## 2014-05-11 VITALS — BP 142/70 | HR 56 | Resp 15 | Ht 64.5 in | Wt 149.0 lb

## 2014-05-11 DIAGNOSIS — Z0000 Encounter for general adult medical examination without abnormal findings: Secondary | ICD-10-CM | POA: Diagnosis not present

## 2014-05-11 DIAGNOSIS — I2510 Atherosclerotic heart disease of native coronary artery without angina pectoris: Secondary | ICD-10-CM | POA: Diagnosis not present

## 2014-05-11 DIAGNOSIS — Z01812 Encounter for preprocedural laboratory examination: Secondary | ICD-10-CM

## 2014-05-11 DIAGNOSIS — Z1211 Encounter for screening for malignant neoplasm of colon: Secondary | ICD-10-CM

## 2014-05-11 DIAGNOSIS — R5382 Chronic fatigue, unspecified: Secondary | ICD-10-CM | POA: Diagnosis not present

## 2014-05-11 DIAGNOSIS — Z87891 Personal history of nicotine dependence: Secondary | ICD-10-CM | POA: Insufficient documentation

## 2014-05-11 DIAGNOSIS — Z122 Encounter for screening for malignant neoplasm of respiratory organs: Secondary | ICD-10-CM | POA: Insufficient documentation

## 2014-05-11 DIAGNOSIS — Z720 Tobacco use: Secondary | ICD-10-CM | POA: Diagnosis not present

## 2014-05-11 DIAGNOSIS — Q676 Pectus excavatum: Secondary | ICD-10-CM | POA: Diagnosis not present

## 2014-05-11 DIAGNOSIS — M8580 Other specified disorders of bone density and structure, unspecified site: Secondary | ICD-10-CM | POA: Diagnosis not present

## 2014-05-11 DIAGNOSIS — R918 Other nonspecific abnormal finding of lung field: Secondary | ICD-10-CM | POA: Diagnosis not present

## 2014-05-11 DIAGNOSIS — R938 Abnormal findings on diagnostic imaging of other specified body structures: Secondary | ICD-10-CM

## 2014-05-11 DIAGNOSIS — Z136 Encounter for screening for cardiovascular disorders: Secondary | ICD-10-CM | POA: Diagnosis not present

## 2014-05-11 DIAGNOSIS — E038 Other specified hypothyroidism: Secondary | ICD-10-CM

## 2014-05-11 DIAGNOSIS — E559 Vitamin D deficiency, unspecified: Secondary | ICD-10-CM | POA: Diagnosis not present

## 2014-05-11 DIAGNOSIS — E039 Hypothyroidism, unspecified: Secondary | ICD-10-CM | POA: Diagnosis not present

## 2014-05-11 DIAGNOSIS — Z0189 Encounter for other specified special examinations: Secondary | ICD-10-CM | POA: Diagnosis not present

## 2014-05-11 LAB — LIPID PANEL
Cholesterol: 216 mg/dL — ABNORMAL HIGH (ref 0–200)
HDL: 50 mg/dL (ref >39)
LDL CALC: 146 mg/dL — AB (ref 0–99)
Total CHOL/HDL Ratio: 4.3 Ratio
Triglycerides: 98 mg/dL (ref <150)
VLDL: 20 mg/dL (ref 0–40)

## 2014-05-11 LAB — COMPLETE METABOLIC PANEL WITH GFR
ALBUMIN: 4 g/dL (ref 3.5–5.2)
ALT: 11 U/L (ref 0–35)
AST: 15 U/L (ref 0–37)
Alkaline Phosphatase: 63 U/L (ref 39–117)
BILIRUBIN TOTAL: 0.4 mg/dL (ref 0.2–1.2)
BUN: 13 mg/dL (ref 6–23)
CO2: 27 mEq/L (ref 19–32)
Calcium: 9.7 mg/dL (ref 8.4–10.5)
Chloride: 106 mEq/L (ref 96–112)
Creat: 0.59 mg/dL (ref 0.50–1.10)
GFR, EST NON AFRICAN AMERICAN: 83 mL/min
GLUCOSE: 82 mg/dL (ref 70–99)
Potassium: 4.5 mEq/L (ref 3.5–5.3)
Sodium: 141 mEq/L (ref 135–145)
TOTAL PROTEIN: 6.2 g/dL (ref 6.0–8.3)

## 2014-05-11 LAB — CBC WITH DIFFERENTIAL/PLATELET
Basophils Absolute: 0 10*3/uL (ref 0.0–0.1)
Basophils Relative: 0 % (ref 0–1)
EOS PCT: 3 % (ref 0–5)
Eosinophils Absolute: 0.2 10*3/uL (ref 0.0–0.7)
HCT: 42.2 % (ref 36.0–46.0)
Hemoglobin: 13.9 g/dL (ref 12.0–15.0)
LYMPHS PCT: 42 % (ref 12–46)
Lymphs Abs: 2.2 10*3/uL (ref 0.7–4.0)
MCH: 29.8 pg (ref 26.0–34.0)
MCHC: 32.9 g/dL (ref 30.0–36.0)
MCV: 90.4 fL (ref 78.0–100.0)
MONO ABS: 0.6 10*3/uL (ref 0.1–1.0)
MPV: 10.1 fL (ref 8.6–12.4)
Monocytes Relative: 12 % (ref 3–12)
Neutro Abs: 2.2 10*3/uL (ref 1.7–7.7)
Neutrophils Relative %: 43 % (ref 43–77)
Platelets: 251 10*3/uL (ref 150–400)
RBC: 4.67 MIL/uL (ref 3.87–5.11)
RDW: 14.1 % (ref 11.5–15.5)
WBC: 5.2 10*3/uL (ref 4.0–10.5)

## 2014-05-11 LAB — POCT URINALYSIS DIPSTICK
BILIRUBIN UA: NEGATIVE
Glucose, UA: NEGATIVE
Ketones, UA: NEGATIVE
Leukocytes, UA: NEGATIVE
NITRITE UA: NEGATIVE
PROTEIN UA: NEGATIVE
RBC UA: NEGATIVE
Spec Grav, UA: 1.02
UROBILINOGEN UA: NEGATIVE
pH, UA: 6.5

## 2014-05-11 LAB — HEMOCCULT GUIAC POC 1CARD (OFFICE)
Fecal Occult Blood, POC: NEGATIVE
OCCULT BLOOD DATE: 1202016

## 2014-05-11 LAB — TSH: TSH: 5.602 u[IU]/mL — AB (ref 0.350–4.500)

## 2014-05-11 NOTE — Progress Notes (Signed)
Subjective:    Patient ID: Kylie Walters, female    DOB: June 05, 1924, 81 y.o.   MRN: [id number]  HPI  05/2013 hematology note Assessment and Plan: Kylie Walters is an 81 year old white female. She is a minimal monoclonal spike. His to 0.25 g/dL. He was found to be an IgG kappa protein.  At her age, I would expect her to have an MGUS. It is not a common to see this in elderly patients.  I do not see anything anything that suggest myeloma. She has normal immunoglobulin levels.  I willwe'll not put her through a bunch of tests. She does not any x-ray test.she had a CAT scan of the chest last year. No bony abnormalities were noted.  I really don't think we have to see Kylie Walters back. I don't believe that she ever will have a problem with his MGUS. Again, at her age, is not surprising.  I don't see need for a bone marrow test.  I spent a good 45 minutes with her. It was nice talking to her about doing when. She probably will bethe healthiest person I see all day.  10/2012 CPE Health maintenance Declines Zostavax. Declines colonoscopy despite my repeated counsel that this is necessary and may miss an early colon cancer or polyp.   DJD/L carpal tunnel. Willl try Voltaren 1% gell to use to thumb BID. She has not GI symptoms but I advised her to stop Alleve and she can have 2 tylenol BID if needed. See me in 6 weeks. If not better may need steroid injection versus surgery for her Carpal tunnel  Long term tobacco use. Will schedule screening CT.   Hypothyroidism will check today  Osteopenia Continue calcium/vitamin D Will schedule DEXA Later this year  RLL lung granuloma   Diveticulosis  GERD Advised to stop oral NSAIDS   Hyperlipidemia will check today  History of depression On meds now . Mood stable   See me in 6 weeks  TODAY: Kylie Walters is here for CPE  HM:   Kylie Walters continue to decline colonoscopy and vaccine.  MM neg 06/2013  She has a 60 pack year smoking history.  CT  chest neg malignancy last year  She declines DEXA  Osteopenia she is taking 'calcium and vitamin D   Declines DEXA  Hypothyroidism  She is taking meds  Elevated BP  See BP  Repeat on exam much better  LOng term smoker  She is due for screening CT   MGUS  See hematology note above     Allergies  Allergen Reactions  . Lipitor [Atorvastatin Calcium] Other (See Comments)    Myalgia    Past Medical History  Diagnosis Date  . Hypothyroidism   . Osteopenia   . Diverticulosis   . Granuloma of skin     RLL  . Vitamin D deficiency   . GERD (gastroesophageal reflux disease)   . Hyperlipidemia   . Hearing loss   . Arthritis   . Depression 4/03  . Lumbar herniated disc    Past Surgical History  Procedure Laterality Date  . Tonsillectomy and adenoidectomy  1939  . Dilation and curettage of uterus  1954, 1978, 1979  . Abdominal hysterectomy  1980  . Knee surgery  2004    left knee  . Cataract extraction  2010  . Breast surgery      L breast cyst   History   Social History  . Marital Status: Widowed    Spouse Name: N/A  Number of Children: N/A  . Years of Education: N/A   Occupational History  . Not on file.   Social History Main Topics  . Smoking status: Former Smoker -- 0.50 packs/day for 40 years    Types: Cigarettes    Start date: 06/09/1940    Quit date: 04/23/1979  . Smokeless tobacco: Never Used     Comment: quit smoking 40 years ago  . Alcohol Use: Yes     Comment: socially  . Drug Use: No  . Sexual Activity: Not Currently   Other Topics Concern  . Not on file   Social History Narrative   Family History  Problem Relation Age of Onset  . Breast cancer Mother     metastatic disease  . Kidney disease Father     Bright's disease  . Cancer Maternal Grandfather     prostate   Patient Active Problem List   Diagnosis Date Noted  . Osteoarthritis 05/29/2013  . 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 Tobacco use 11/16/2012  . Calcified  granuloma of lung 11/16/2012  . Sinus bradycardia on ECG 11/16/2012  . TMJ arthralgia 03/10/2012  . Unspecified hypothyroidism 10/07/2011  . Osteopenia 10/07/2011  . Diverticulitis 10/07/2011  . Lung granuloma 10/07/2011  . Vitamin d deficiency 10/07/2011  . GERD (gastroesophageal reflux disease) 10/07/2011  . Other and unspecified hyperlipidemia 10/07/2011  . Hearing loss 10/07/2011  . DJD (degenerative joint disease) 10/07/2011  . History of depression 10/07/2011  . Disc herniation 10/07/2011  . H/O hysterectomy with oophorectomy 10/07/2011  . Family history of breast cancer in first degree relative 10/07/2011  . Thyroid cyst 10/07/2011   Current Outpatient Prescriptions on File Prior to Visit  Medication Sig Dispense Refill  . acetaminophen (TYLENOL) 500 MG tablet Take 1,000 mg by mouth 3 (three) times daily as needed for pain (for pain).    Marland Kitchen aspirin 81 MG tablet Take 81 mg by mouth daily.      . Calcium Carbonate-Vitamin D (CALCIUM 500 + D PO) Take 1 tablet by mouth daily.      . Cholecalciferol (VITAMIN D3) 2000 UNITS TABS Take 1 tablet by mouth daily.      Marland Kitchen ibuprofen (ADVIL,MOTRIN) 200 MG tablet Take 400 mg by mouth every 6 (six) hours as needed.      Marland Kitchen levothyroxine (SYNTHROID, LEVOTHROID) 88 MCG tablet Take 1 tablet (88 mcg total) by mouth daily before breakfast. Monday - Friday 88 MCG  Saturday AND Sunday 1/2 TAB OR 44 MCG. 90 tablet 1  . Multiple Vitamin (MULTIVITAMIN) tablet Take 1 tablet by mouth daily.       No current facility-administered medications on file prior to visit.      Review of Systems  Respiratory: Negative for cough, chest tightness, shortness of breath and wheezing.   Cardiovascular: Negative for chest pain, palpitations and leg swelling.  All other systems reviewed and are negative.      Objective:   Physical Exam  Physical Exam  Nursing note and vitals reviewed.  Constitutional: She is oriented to person, place, and time. She appears  well-developed and well-nourished.  HENT:  Head: Normocephalic and atraumatic.  Right Ear: Tympanic membrane and ear canal normal. No drainage. Tympanic membrane is not injected and not erythematous.  Left Ear: Tympanic membrane and ear canal normal. No drainage. Tympanic membrane is not injected and not erythematous.  Nose: Nose normal. Right sinus exhibits no maxillary sinus tenderness and no frontal sinus tenderness. Left sinus exhibits no maxillary sinus  tenderness and no frontal sinus tenderness.  Mouth/Throat: Oropharynx is clear and moist. No oral lesions. No oropharyngeal exudate.  Eyes: Conjunctivae and EOM are normal. Pupils are equal, round, and reactive to light.  Neck: Normal range of motion. Neck supple. No JVD present. Carotid bruit is not present. No mass and no thyromegaly present.  Cardiovascular: Normal rate, regular rhythm, S1 normal, S2 normal and intact distal pulses. Exam reveals no gallop and no friction rub.  No murmur heard.  Pulses:  Carotid pulses are 2+ on the right side, and 2+ on the left side.  Dorsalis pedis pulses are 2+ on the right side, and 2+ on the left side.  No carotid bruit. No LE edema  Pulmonary/Chest: Breath sounds normal. She has no wheezes. She has no rales. She exhibits no tenderness. Breast no discrete mass no nipple discharge no axillary adenopathy bilaterally Abdominal: Soft. Bowel sounds are normal. She exhibits no distension and no mass. There is no hepatosplenomegaly. There is no tenderness. There is no CVA tenderness.  Rectal no mass guaiac neg  Musculoskeletal: Normal range of motion.  No active synovitis to joints.  Lymphadenopathy:  She has no cervical adenopathy.  She has no axillary adenopathy.  Right: No inguinal and no supraclavicular adenopathy present.  Left: No inguinal and no supraclavicular adenopathy present.  Neurological: She is alert and oriented to person, place, and time. She has normal strength and normal reflexes. She  displays no tremor. No cranial nerve deficit or sensory deficit. Coordination and gait normal.  Skin: Skin is warm and dry. No rash noted. No cyanosis. Nails show no clubbing.  Psychiatric: She has a normal mood and affect. Her speech is normal and behavior is normal. Cognition and memory are normal.         Assessment & Plan:  HM  Will schedule mm and screening Chest CT  Declines colonoscopy and DEXA   S/p hysterectomy  Will order Prevnar 13  Declines Zostavax  Hypothyroid  Will check TSH today   MGUS see above  Osteopenia    Calcium vitamin D   She declines DEXA   Elevated BP  Recheck is improved.  Will moniter

## 2014-05-12 ENCOUNTER — Encounter: Payer: Self-pay | Admitting: *Deleted

## 2014-05-12 LAB — VITAMIN D 25 HYDROXY (VIT D DEFICIENCY, FRACTURES): Vit D, 25-Hydroxy: 22 ng/mL — ABNORMAL LOW (ref 30–100)

## 2014-05-15 ENCOUNTER — Encounter: Payer: Self-pay | Admitting: Internal Medicine

## 2014-05-16 ENCOUNTER — Other Ambulatory Visit (HOSPITAL_BASED_OUTPATIENT_CLINIC_OR_DEPARTMENT_OTHER): Payer: Medicare Other

## 2014-05-16 ENCOUNTER — Ambulatory Visit (HOSPITAL_BASED_OUTPATIENT_CLINIC_OR_DEPARTMENT_OTHER): Payer: Medicare Other

## 2014-05-16 ENCOUNTER — Telehealth: Payer: Self-pay | Admitting: *Deleted

## 2014-05-16 LAB — T3, FREE: T3, Free: 2.6 pg/mL (ref 2.3–4.2)

## 2014-05-16 LAB — T4, FREE: Free T4: 1.21 ng/dL (ref 0.80–1.80)

## 2014-05-16 NOTE — Telephone Encounter (Signed)
-----   Message from Lanice Shirts, MD sent at 05/15/2014  8:13 AM EST ----- Kylie Walters  Call pt and let her know that her CT scan shows no increase in growth of very tiny lung nodules.  No need to have any further CT scan of lung for lung cancer screening.

## 2014-05-16 NOTE — Telephone Encounter (Signed)
-----   Message from Lanice Shirts, MD sent at 05/15/2014  8:37 AM EST ----- [name]   Add a free T3 and a free T4 to these labs.  Call pt and let her know her thyroid blood test is abnormal and I need to see her in office to discuss.  Give her a 30 min appt to see me .   Route back with appt date

## 2014-05-16 NOTE — Telephone Encounter (Signed)
I spoke with Kylie Walters about her lab results and made her a follow up appointment-eh

## 2014-05-16 NOTE — Telephone Encounter (Signed)
I spoke with Kylie Walters about her CT scan -eh

## 2014-05-18 ENCOUNTER — Ambulatory Visit (INDEPENDENT_AMBULATORY_CARE_PROVIDER_SITE_OTHER): Payer: Medicare Other | Admitting: *Deleted

## 2014-05-18 DIAGNOSIS — Z23 Encounter for immunization: Secondary | ICD-10-CM | POA: Diagnosis not present

## 2014-05-24 ENCOUNTER — Other Ambulatory Visit: Payer: Self-pay | Admitting: Internal Medicine

## 2014-05-24 ENCOUNTER — Ambulatory Visit (INDEPENDENT_AMBULATORY_CARE_PROVIDER_SITE_OTHER): Payer: Medicare Other | Admitting: Internal Medicine

## 2014-05-24 ENCOUNTER — Encounter: Payer: Self-pay | Admitting: Internal Medicine

## 2014-05-24 VITALS — BP 148/59 | HR 59 | Resp 16 | Ht 64.5 in | Wt 152.0 lb

## 2014-05-24 DIAGNOSIS — E038 Other specified hypothyroidism: Secondary | ICD-10-CM

## 2014-05-24 DIAGNOSIS — E785 Hyperlipidemia, unspecified: Secondary | ICD-10-CM

## 2014-05-24 DIAGNOSIS — M8580 Other specified disorders of bone density and structure, unspecified site: Secondary | ICD-10-CM

## 2014-05-24 DIAGNOSIS — Z780 Asymptomatic menopausal state: Secondary | ICD-10-CM

## 2014-05-24 DIAGNOSIS — E782 Mixed hyperlipidemia: Secondary | ICD-10-CM | POA: Insufficient documentation

## 2014-05-24 NOTE — Progress Notes (Signed)
Subjective:    Patient ID: Kylie Walters, female    DOB: 1924-11-22, 81 y.o.   MRN: [id number]  HPI  Devine is here to follow on labs   See TSH   She has been skipping her Sunday dose    See lipids  She does not want RX meds   " I am eating too much cheese"  Allergies  Allergen Reactions  . Lipitor [Atorvastatin Calcium] Other (See Comments)    Myalgia    Past Medical History  Diagnosis Date  . Hypothyroidism   . Osteopenia   . Diverticulosis   . Granuloma of skin     RLL  . Vitamin D deficiency   . GERD (gastroesophageal reflux disease)   . Hyperlipidemia   . Hearing loss   . Arthritis   . Depression 4/03  . Lumbar herniated disc    Past Surgical History  Procedure Laterality Date  . Tonsillectomy and adenoidectomy  1939  . Dilation and curettage of uterus  1954, 1978, 1979  . Abdominal hysterectomy  1980  . Knee surgery  2004    left knee  . Cataract extraction  2010  . Breast surgery      L breast cyst   History   Social History  . Marital Status: Widowed    Spouse Name: N/A    Number of Children: N/A  . Years of Education: N/A   Occupational History  . Not on file.   Social History Main Topics  . Smoking status: Former Smoker -- 0.50 packs/day for 40 years    Types: Cigarettes    Start date: 06/09/1940    Quit date: 04/23/1979  . Smokeless tobacco: Never Used     Comment: quit smoking 40 years ago  . Alcohol Use: Yes     Comment: socially  . Drug Use: No  . Sexual Activity: Not Currently    Birth Control/ Protection: Post-menopausal   Other Topics Concern  . Not on file   Social History Narrative   Family History  Problem Relation Age of Onset  . Breast cancer Mother     metastatic disease  . Kidney disease Father     Bright's disease  . Cancer Maternal Grandfather     prostate   Patient Active Problem List   Diagnosis Date Noted  . Osteoarthritis 05/29/2013  . 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 Tobacco use disorder   Quit smoking 35 years ago  11/16/2012  . Calcified granuloma of lung 11/16/2012  . Sinus bradycardia on ECG 11/16/2012  . TMJ arthralgia 03/10/2012  . Unspecified hypothyroidism 10/07/2011  . Osteopenia 10/07/2011  . Diverticulitis 10/07/2011  . Lung granuloma 10/07/2011  . Vitamin d deficiency 10/07/2011  . GERD (gastroesophageal reflux disease) 10/07/2011  . Other and unspecified hyperlipidemia 10/07/2011  . Hearing loss 10/07/2011  . DJD (degenerative joint disease) 10/07/2011  . History of depression 10/07/2011  . Disc herniation 10/07/2011  . H/O hysterectomy with oophorectomy 10/07/2011  . Family history of breast cancer in first degree relative 10/07/2011  . Thyroid cyst 10/07/2011   Current Outpatient Prescriptions on File Prior to Visit  Medication Sig Dispense Refill  . acetaminophen (TYLENOL) 500 MG tablet Take 1,000 mg by mouth 3 (three) times daily as needed for pain (for pain).    Marland Kitchen ibuprofen (ADVIL,MOTRIN) 200 MG tablet Take 400 mg by mouth every 6 (six) hours as needed.      Marland Kitchen levothyroxine (SYNTHROID, LEVOTHROID) 88 MCG tablet Take 1  tablet (88 mcg total) by mouth daily before breakfast. Monday - Friday 88 MCG  Saturday AND Sunday 1/2 TAB OR 44 MCG. 90 tablet 1  . Multiple Vitamin (MULTIVITAMIN) tablet Take 1 tablet by mouth daily.      . psyllium (METAMUCIL) 58.6 % powder Take 1 packet by mouth 3 (three) times daily.     No current facility-administered medications on file prior to visit.      Review of Systems See HPI    Objective:   Physical Exam Physical Exam  Nursing note and vitals reviewed.  Constitutional: She is oriented to person, place, and time. She appears well-developed and well-nourished.  HENT:  Head: Normocephalic and atraumatic.  Cardiovascular: Normal rate and regular rhythm. Exam reveals no gallop and no friction rub.  No murmur heard.  Pulmonary/Chest: Breath sounds normal. She has no wheezes. She has no rales.  Neurological: She is  alert and oriented to person, place, and time.  Skin: Skin is warm and dry.  Psychiatric: She has a normal mood and affect. Her behavior is normal.       Assessment & Plan:  Hypothyroidism:   Advised to take one pill daily MOn-sat and 1/2 pill on Sunday   Hyperlipidemia   DASH diet given.    She declines any RX meds.    See me as needed

## 2014-05-25 ENCOUNTER — Other Ambulatory Visit: Payer: Self-pay | Admitting: Internal Medicine

## 2014-05-25 DIAGNOSIS — M8580 Other specified disorders of bone density and structure, unspecified site: Secondary | ICD-10-CM

## 2014-05-25 DIAGNOSIS — E2839 Other primary ovarian failure: Secondary | ICD-10-CM

## 2014-05-27 ENCOUNTER — Ambulatory Visit
Admission: RE | Admit: 2014-05-27 | Discharge: 2014-05-27 | Disposition: A | Discharge status: Home / Self Care | Payer: Medicare Other | Source: Ambulatory Visit | Attending: Internal Medicine | Admitting: Internal Medicine

## 2014-05-27 DIAGNOSIS — E2839 Other primary ovarian failure: Secondary | ICD-10-CM

## 2014-05-27 DIAGNOSIS — M85852 Other specified disorders of bone density and structure, left thigh: Secondary | ICD-10-CM | POA: Diagnosis not present

## 2014-05-27 DIAGNOSIS — M8580 Other specified disorders of bone density and structure, unspecified site: Secondary | ICD-10-CM

## 2014-05-27 DIAGNOSIS — Z780 Asymptomatic menopausal state: Secondary | ICD-10-CM | POA: Diagnosis not present

## 2014-05-30 ENCOUNTER — Telehealth: Payer: Self-pay | Admitting: *Deleted

## 2014-05-30 NOTE — Telephone Encounter (Signed)
Nalda is aware of her DEXA scan and the recommendations of calcuim and vitamin D

## 2014-05-30 NOTE — Telephone Encounter (Signed)
-----   Message from Lanice Shirts, MD sent at 05/30/2014  7:29 AM EST ----- Let pt know she has early bone thinning but not osteoporosis.  Calcium 1200-1500 mg and vitamin D [phone] units daily

## 2014-06-03 DIAGNOSIS — H18413 Arcus senilis, bilateral: Secondary | ICD-10-CM | POA: Diagnosis not present

## 2014-06-03 DIAGNOSIS — H524 Presbyopia: Secondary | ICD-10-CM | POA: Diagnosis not present

## 2014-06-03 DIAGNOSIS — H52223 Regular astigmatism, bilateral: Secondary | ICD-10-CM | POA: Diagnosis not present

## 2014-06-03 DIAGNOSIS — Z961 Presence of intraocular lens: Secondary | ICD-10-CM | POA: Diagnosis not present

## 2014-06-03 DIAGNOSIS — H5203 Hypermetropia, bilateral: Secondary | ICD-10-CM | POA: Diagnosis not present

## 2014-06-21 ENCOUNTER — Telehealth: Payer: Self-pay | Admitting: Internal Medicine

## 2014-06-21 NOTE — Telephone Encounter (Signed)
Call pt if not already done   and let her know that her bone density test is normal  No osteoporosis

## 2014-06-22 NOTE — Telephone Encounter (Signed)
I spoke to pt and she is aware of result.

## 2014-06-27 ENCOUNTER — Ambulatory Visit (HOSPITAL_BASED_OUTPATIENT_CLINIC_OR_DEPARTMENT_OTHER)
Admission: RE | Admit: 2014-06-27 | Discharge: 2014-06-27 | Disposition: A | Discharge status: Home / Self Care | Payer: Medicare Other | Source: Ambulatory Visit | Attending: Internal Medicine | Admitting: Internal Medicine

## 2014-06-27 DIAGNOSIS — Z1231 Encounter for screening mammogram for malignant neoplasm of breast: Secondary | ICD-10-CM | POA: Diagnosis not present

## 2014-08-08 ENCOUNTER — Ambulatory Visit (INDEPENDENT_AMBULATORY_CARE_PROVIDER_SITE_OTHER): Payer: Medicare Other | Admitting: Internal Medicine

## 2014-08-08 ENCOUNTER — Encounter: Payer: Self-pay | Admitting: Internal Medicine

## 2014-08-08 VITALS — BP 136/64 | HR 55 | Resp 16 | Ht 64.5 in | Wt 149.0 lb

## 2014-08-08 DIAGNOSIS — E039 Hypothyroidism, unspecified: Secondary | ICD-10-CM | POA: Diagnosis not present

## 2014-08-08 LAB — T4, FREE: FREE T4: 1.15 ng/dL (ref 0.80–1.80)

## 2014-08-08 LAB — TSH: TSH: 1.838 u[IU]/mL (ref 0.350–4.500)

## 2014-08-08 LAB — T3, FREE: T3, Free: 2.7 pg/mL (ref 2.3–4.2)

## 2014-08-08 NOTE — Progress Notes (Signed)
Subjective:    Patient ID: Kylie Walters, female    DOB: 10/04/1924, 81 y.o.   MRN: [id number]  HPI 05/24/2014 note Assessment & Plan:  Hypothyroidism: Advised to take one pill daily MOn-sat and 1/2 pill on Sunday   Hyperlipidemia DASH diet given. She declines any RX meds.   See me as needed       TODAY  Shantele is here to recheck the TSH   She is taking one whole pil Sun through Friday and 1/2 pill on Saturday.  She is feeling fine  Allergies  Allergen Reactions  . Lipitor [Atorvastatin Calcium] Other (See Comments)    Myalgia    Past Medical History  Diagnosis Date  . Hypothyroidism   . Osteopenia   . Diverticulosis   . Granuloma of skin     RLL  . Vitamin D deficiency   . GERD (gastroesophageal reflux disease)   . Hyperlipidemia   . Hearing loss   . Arthritis   . Depression 4/03  . Lumbar herniated disc    Past Surgical History  Procedure Laterality Date  . Tonsillectomy and adenoidectomy  1939  . Dilation and curettage of uterus  1954, 1978, 1979  . Abdominal hysterectomy  1980  . Knee surgery  2004    left knee  . Cataract extraction  2010  . Breast surgery      L breast cyst   History   Social History  . Marital Status: Widowed    Spouse Name: N/A  . Number of Children: N/A  . Years of Education: N/A   Occupational History  . Not on file.   Social History Main Topics  . Smoking status: Former Smoker -- 0.50 packs/day for 40 years    Types: Cigarettes    Start date: 06/09/1940    Quit date: 04/23/1979  . Smokeless tobacco: Never Used     Comment: quit smoking 40 years ago  . Alcohol Use: Yes     Comment: socially  . Drug Use: No  . Sexual Activity: Not Currently    Birth Control/ Protection: Post-menopausal   Other Topics Concern  . Not on file   Social History Narrative   Family History  Problem Relation Age of Onset  . Breast cancer Mother     metastatic disease  . Kidney disease Father     Bright's disease  .  Cancer Maternal Grandfather     prostate   Patient Active Problem List   Diagnosis Date Noted  . Hyperlipidemia 05/24/2014  . Osteoarthritis 05/29/2013  . 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 Tobacco use disorder  Quit smoking 35 years ago  11/16/2012  . Calcified granuloma of lung 11/16/2012  . Sinus bradycardia on ECG 11/16/2012  . TMJ arthralgia 03/10/2012  . Unspecified hypothyroidism 10/07/2011  . Osteopenia  Dexa done 05/27/2014 normal  10/07/2011  . Diverticulitis 10/07/2011  . Lung granuloma 10/07/2011  . Vitamin D deficiency 10/07/2011  . GERD (gastroesophageal reflux disease) 10/07/2011  . Other and unspecified hyperlipidemia 10/07/2011  . Hearing loss 10/07/2011  . DJD (degenerative joint disease) 10/07/2011  . History of depression 10/07/2011  . Disc herniation 10/07/2011  . H/O hysterectomy with oophorectomy 10/07/2011  . Family history of breast cancer in first degree relative 10/07/2011  . Thyroid cyst 10/07/2011   Current Outpatient Prescriptions on File Prior to Visit  Medication Sig Dispense Refill  . acetaminophen (TYLENOL) 500 MG tablet Take 1,000 mg by mouth 3 (three) times daily as  needed for pain (for pain).    Marland Kitchen ibuprofen (ADVIL,MOTRIN) 200 MG tablet Take 400 mg by mouth every 6 (six) hours as needed.      Marland Kitchen levothyroxine (SYNTHROID, LEVOTHROID) 88 MCG tablet Take 1 tablet (88 mcg total) by mouth daily before breakfast. Monday - Friday 88 MCG  Saturday AND Sunday 1/2 TAB OR 44 MCG. 90 tablet 1  . Multiple Vitamin (MULTIVITAMIN) tablet Take 1 tablet by mouth daily.      . psyllium (METAMUCIL) 58.6 % powder Take 1 packet by mouth 3 (three) times daily.     No current facility-administered medications on file prior to visit.       Review of Systems See HPI    Objective:   Physical Exam Physical Exam  Nursing note and vitals reviewed.  Constitutional: She is oriented to person, place, and time. She appears well-developed and well-nourished.    HENT:  Head: Normocephalic and atraumatic.  Cardiovascular: Normal rate and regular rhythm. Exam reveals no gallop and no friction rub.  No murmur heard.  Pulmonary/Chest: Breath sounds normal. She has no wheezes. She has no rales.  Neurological: She is alert and oriented to person, place, and time.  Skin: Skin is warm and dry.  Psychiatric: She has a normal mood and affect. Her behavior is normal.        Assessment & Plan:  Hypothyroidism  Will rehceck today  Pt received letter of my departure and advised to call and make appt with PCP of choice

## 2014-08-09 ENCOUNTER — Telehealth: Payer: Self-pay | Admitting: *Deleted

## 2014-08-09 ENCOUNTER — Other Ambulatory Visit: Payer: Self-pay | Admitting: *Deleted

## 2014-08-09 NOTE — Telephone Encounter (Signed)
Refill request

## 2014-08-09 NOTE — Telephone Encounter (Signed)
-----   Message from Lanice Shirts, MD sent at 08/09/2014  7:29 AM EDT ----- Call Izora Gala and let her know that her thyroid blood test is now perfect and she should continue exact same dosing of her thyroid medication  Ok to mail to her

## 2014-08-09 NOTE — Telephone Encounter (Signed)
Pt is aware of her thyroid results

## 2014-08-10 MED ORDER — LEVOTHYROXINE SODIUM 88 MCG PO TABS: 88.0000 ug | ORAL_TABLET | Freq: Every day | ORAL | Status: DC

## 2014-11-15 ENCOUNTER — Encounter: Payer: Self-pay | Admitting: Family Medicine

## 2014-11-15 ENCOUNTER — Ambulatory Visit (INDEPENDENT_AMBULATORY_CARE_PROVIDER_SITE_OTHER): Payer: Medicare Other | Admitting: Family Medicine

## 2014-11-15 VITALS — BP 174/82 | HR 67 | Ht 65.5 in | Wt 148.0 lb

## 2014-11-15 DIAGNOSIS — E039 Hypothyroidism, unspecified: Secondary | ICD-10-CM | POA: Diagnosis not present

## 2014-11-15 DIAGNOSIS — Z111 Encounter for screening for respiratory tuberculosis: Secondary | ICD-10-CM | POA: Diagnosis not present

## 2014-11-15 DIAGNOSIS — Z139 Encounter for screening, unspecified: Secondary | ICD-10-CM | POA: Diagnosis not present

## 2014-11-15 LAB — CBC
HCT: 43.5 % (ref 36.0–46.0)
Hemoglobin: 14.5 g/dL (ref 12.0–15.0)
MCH: 29.8 pg (ref 26.0–34.0)
MCHC: 33.3 g/dL (ref 30.0–36.0)
MCV: 89.5 fL (ref 78.0–100.0)
MPV: 10.7 fL (ref 8.6–12.4)
Platelets: 245 10*3/uL (ref 150–400)
RBC: 4.86 MIL/uL (ref 3.87–5.11)
RDW: 14.2 % (ref 11.5–15.5)
WBC: 4.8 10*3/uL (ref 4.0–10.5)

## 2014-11-15 LAB — POCT URINALYSIS DIPSTICK
BILIRUBIN UA: NEGATIVE
Glucose, UA: NEGATIVE
Ketones, UA: NEGATIVE
Leukocytes, UA: NEGATIVE
Nitrite, UA: NEGATIVE
PH UA: 8
RBC UA: NEGATIVE
Spec Grav, UA: 1.02
UROBILINOGEN UA: 0.2

## 2014-11-15 LAB — TSH: TSH: 0.877 u[IU]/mL (ref 0.350–4.500)

## 2014-11-15 NOTE — Progress Notes (Signed)
Kylie Walters is a 81 y.o. female who presents to [facility]  today for form filled out. Patient is currently in between primary care providers. She is here today solely to fill out a form for her retirement community. She does not intend this location to be her primary care provider. She has an appointment scheduled in a few months with her actual primary care provider.  She notes her only active medical problem is hypothyroidism. She wakes levothyroxine. She has a history of white coat hypertension with no chest pains palpitations or shortness of breath.  Past Medical History  Diagnosis Date  . Hypothyroidism   . Osteopenia   . Diverticulosis   . Granuloma of skin     RLL  . Vitamin D deficiency   . GERD (gastroesophageal reflux disease)   . Hyperlipidemia   . Hearing loss   . Arthritis   . Depression 4/03  . Lumbar herniated disc    Past Surgical History  Procedure Laterality Date  . Tonsillectomy and adenoidectomy  1939  . Dilation and curettage of uterus  1954, 1978, 1979  . Abdominal hysterectomy  1980  . Knee surgery  2004    left knee  . Cataract extraction  2010  . Breast surgery      L breast cyst   History  Substance Use Topics  . Smoking status: Former Smoker -- 0.50 packs/day for 40 years    Types: Cigarettes    Start date: 06/09/1940    Quit date: 04/23/1979  . Smokeless tobacco: Never Used     Comment: quit smoking 40 years ago  . Alcohol Use: Yes     Comment: socially   ROS as above Medications: Current Outpatient Prescriptions  Medication Sig Dispense Refill  . calcium-vitamin D (OSCAL WITH D) 500-200 MG-UNIT per tablet Take 1 tablet by mouth.    . levothyroxine (SYNTHROID, LEVOTHROID) 88 MCG tablet Take 1 tablet (88 mcg total) by mouth daily before breakfast. Monday - Friday 88 MCG  Saturday AND Sunday 1/2 TAB OR 44 MCG. 90 tablet 1  . psyllium (METAMUCIL) 58.6 % packet Take 1 packet by mouth daily.     No current  facility-administered medications for this visit.   Allergies  Allergen Reactions  . Lipitor [Atorvastatin Calcium] Other (See Comments)    Myalgia      Exam:  BP 174/82 mmHg  Pulse 67  Ht 5' 5.5" (1.664 m)  Wt 148 lb (67.132 kg)  BMI 24.25 kg/m2 Gen: Well NAD HEENT: EOMI,  MMM PERRL. no thyroid nodule normal tympanic membranes and posterior pharynx Lungs: Normal work of breathing. CTABL Heart: RRR no MRG Abd: NABS, Soft. Nondistended, Nontender Exts: Brisk capillary refill, warm and well perfused.  Rectum: Normal-appearing external anus  Visual Acuity:  With correction Right Eye Distance:   20/20 Left Eye Distance:   20/20 Bilateral Distance:   20/20   No results found for this or any previous visit (from the past 24 hour(s)). No results found.   Please see individual assessment and plan sections.

## 2014-11-15 NOTE — Assessment & Plan Note (Signed)
Doing well. Check TSH and CBC and metabolic panel today. Follow-up with actual PCP at some point in the near future.

## 2014-11-15 NOTE — Patient Instructions (Signed)
Thank you for coming in today. Follow up with Dr. Randel Pigg.  Return as needed.  I will let you know if any labs are significantly abnormal.  I will send in the form when completed.   Call or go to the emergency room if you get worse, have trouble breathing, have chest pains, or palpitations.

## 2014-11-17 LAB — TB SKIN TEST: TB Skin Test: NEGATIVE

## 2015-01-13 DIAGNOSIS — Z23 Encounter for immunization: Secondary | ICD-10-CM | POA: Diagnosis not present

## 2015-02-06 ENCOUNTER — Ambulatory Visit (INDEPENDENT_AMBULATORY_CARE_PROVIDER_SITE_OTHER): Payer: Medicare Other | Admitting: Family Medicine

## 2015-02-06 ENCOUNTER — Encounter: Payer: Self-pay | Admitting: Family Medicine

## 2015-02-06 VITALS — BP 142/88 | HR 65 | Temp 97.9°F | Ht 65.0 in | Wt 152.2 lb

## 2015-02-06 DIAGNOSIS — E785 Hyperlipidemia, unspecified: Secondary | ICD-10-CM

## 2015-02-06 DIAGNOSIS — E039 Hypothyroidism, unspecified: Secondary | ICD-10-CM

## 2015-02-06 DIAGNOSIS — M8580 Other specified disorders of bone density and structure, unspecified site: Secondary | ICD-10-CM

## 2015-02-06 DIAGNOSIS — E559 Vitamin D deficiency, unspecified: Secondary | ICD-10-CM | POA: Diagnosis not present

## 2015-02-06 DIAGNOSIS — E782 Mixed hyperlipidemia: Secondary | ICD-10-CM

## 2015-02-06 DIAGNOSIS — R32 Unspecified urinary incontinence: Secondary | ICD-10-CM

## 2015-02-06 DIAGNOSIS — K219 Gastro-esophageal reflux disease without esophagitis: Secondary | ICD-10-CM

## 2015-02-06 DIAGNOSIS — N393 Stress incontinence (female) (male): Secondary | ICD-10-CM

## 2015-02-06 DIAGNOSIS — Z8619 Personal history of other infectious and parasitic diseases: Secondary | ICD-10-CM | POA: Insufficient documentation

## 2015-02-06 HISTORY — DX: Unspecified urinary incontinence: R32

## 2015-02-06 LAB — COMPREHENSIVE METABOLIC PANEL
ALT: 12 U/L (ref 0–35)
AST: 17 U/L (ref 0–37)
Albumin: 4 g/dL (ref 3.5–5.2)
Alkaline Phosphatase: 63 U/L (ref 39–117)
BUN: 13 mg/dL (ref 6–23)
CALCIUM: 9.8 mg/dL (ref 8.4–10.5)
CO2: 30 mEq/L (ref 19–32)
CREATININE: 0.59 mg/dL (ref 0.40–1.20)
Chloride: 107 mEq/L (ref 96–112)
GFR: 102.11 mL/min (ref >60.00)
GLUCOSE: 87 mg/dL (ref 70–99)
Potassium: 3.9 mEq/L (ref 3.5–5.1)
Sodium: 143 mEq/L (ref 135–145)
TOTAL PROTEIN: 6.7 g/dL (ref 6.0–8.3)
Total Bilirubin: 0.7 mg/dL (ref 0.2–1.2)

## 2015-02-06 LAB — LIPID PANEL
Cholesterol: 221 mg/dL — ABNORMAL HIGH (ref 0–200)
HDL: 59.1 mg/dL (ref >39.00)
LDL Cholesterol: 137 mg/dL — ABNORMAL HIGH (ref 0–99)
NonHDL: 161.75
Total CHOL/HDL Ratio: 4
Triglycerides: 124 mg/dL (ref 0.0–149.0)
VLDL: 24.8 mg/dL (ref 0.0–40.0)

## 2015-02-06 LAB — VITAMIN D 25 HYDROXY (VIT D DEFICIENCY, FRACTURES): VITD: 36.97 ng/mL (ref 30.00–100.00)

## 2015-02-06 NOTE — Assessment & Plan Note (Signed)
On Levothyroxine, continue to monitor 

## 2015-02-06 NOTE — Progress Notes (Signed)
Pre visit review using our clinic review tool, if applicable. No additional management support is needed unless otherwise documented below in the visit note. 

## 2015-02-06 NOTE — Assessment & Plan Note (Signed)
Happens daily but not constatnly, has done Pelvic floor therapy in past, declines meds

## 2015-02-06 NOTE — Assessment & Plan Note (Signed)
Encouraged heart healthy diet, increase exercise, avoid trans fats, consider a krill oil cap daily 

## 2015-02-06 NOTE — Patient Instructions (Signed)
Urinary Incontinence Urinary incontinence is the involuntary loss of urine from your bladder. CAUSES  There are many causes of urinary incontinence. They include:  Medicines.  Infections.  Prostatic enlargement, leading to overflow of urine from your bladder.  Surgery.  Neurological diseases.  Emotional factors. SIGNS AND SYMPTOMS Urinary Incontinence can be divided into four types: 1. Urge incontinence. Urge incontinence is the involuntary loss of urine before you have the opportunity to go to the bathroom. There is a sudden urge to void but not enough time to reach a bathroom. 2. Stress incontinence. Stress incontinence is the sudden loss of urine with any activity that forces urine to pass. It is commonly caused by anatomical changes to the pelvis and sphincter areas of your body. 3. Overflow incontinence. Overflow incontinence is the loss of urine from an obstructed opening to your bladder. This results in a backup of urine and a resultant buildup of pressure within the bladder. When the pressure within the bladder exceeds the closing pressure of the sphincter, the urine overflows, which causes incontinence, similar to water overflowing a dam. 4. Total incontinence. Total incontinence is the loss of urine as a result of the inability to store urine within your bladder. DIAGNOSIS  Evaluating the cause of incontinence may require:  A thorough and complete medical and obstetric history.  A complete physical exam.  Laboratory tests such as a urine culture and sensitivities. When additional tests are indicated, they can include:  An ultrasound exam.  Kidney and bladder X-rays.  Cystoscopy. This is an exam of the bladder using a narrow scope.  Urodynamic testing to test the nerve function to the bladder and sphincter areas. TREATMENT  Treatment for urinary incontinence depends on the cause:  For urge incontinence caused by a bacterial infection, antibiotics will be prescribed.  If the urge incontinence is related to medicines you take, your health care provider may have you change the medicine.  For stress incontinence, surgery to re-establish anatomical support to the bladder or sphincter, or both, will often correct the condition.  For overflow incontinence caused by an enlarged prostate, an operation to open the channel through the enlarged prostate will allow the flow of urine out of the bladder. In women with fibroids, a hysterectomy may be recommended.  For total incontinence, surgery on your urinary sphincter may help. An artificial urinary sphincter (an inflatable cuff placed around the urethra) may be required. In women who have developed a hole-like passage between their bladder and vagina (vesicovaginal fistula), surgery to close the fistula often is required. HOME CARE INSTRUCTIONS  Normal daily hygiene and the use of pads or adult diapers that are changed regularly will help prevent odors and skin damage.  Avoid caffeine. It can overstimulate your bladder.  Use the bathroom regularly. Try about every 2-3 hours to go to the bathroom, even if you do not feel the need to do so. Take time to empty your bladder completely. After urinating, wait a minute. Then try to urinate again.  For causes involving nerve dysfunction, keep a log of the medicines you take and a journal of the times you go to the bathroom. SEEK MEDICAL CARE IF:  You experience worsening of pain instead of improvement in pain after your procedure.  Your incontinence becomes worse instead of better. SEE IMMEDIATE MEDICAL CARE IF:  You experience fever or shaking chills.  You are unable to pass your urine.  You have redness spreading into your groin or down into your thighs. MAKE SURE   YOU:   Understand these instructions.   Will watch your condition.  Will get help right away if you are not doing well or get worse.   This information is not intended to replace advice given to you  by your health care provider. Make sure you discuss any questions you have with your health care provider.   Document Released: 05/16/2004 Document Revised: 04/29/2014 Document Reviewed: 09/15/2012 Elsevier Interactive Patient Education 2016 Elsevier Inc.  

## 2015-02-06 NOTE — Assessment & Plan Note (Signed)
Recheck today. 

## 2015-02-06 NOTE — Assessment & Plan Note (Signed)
Avoid offending foods, start probiotics. Do not eat large meals in late evening and consider raising head of bed.  

## 2015-02-12 NOTE — Progress Notes (Signed)
Subjective:    Patient ID: Kylie Walters, female    DOB: Apr 19, 1925, 81 y.o.   MRN: [id number]  Chief Complaint  Patient presents with  . Establish Care    HPI Patient is in today for new patient appointment. She has been in the process of moving independently burn. She feels well today. No recent illness. Denies any recent hospitalizations or acute illness. Is a retired Corporate treasurer. Notes some mild decreased hearing.  Tries to maintain a heart health diet. Denies CP/palp/SOB/HA/congestion/fevers/GI or GU c/o. Taking meds as prescribed  Past Medical History  Diagnosis Date  . Hypothyroidism   . Osteopenia   . Diverticulosis   . Granuloma of skin     RLL  . Vitamin D deficiency   . GERD (gastroesophageal reflux disease)   . Hyperlipidemia   . Hearing loss   . Arthritis   . Depression 4/03  . Lumbar herniated disc   . Hyperlipidemia, mixed 05/24/2014  . Diverticulosis of colon without hemorrhage 10/07/2011  . History of chicken pox   . H/O measles   . H/O mumps   . Incontinence in female 02/06/2015    Past Surgical History  Procedure Laterality Date  . Tonsillectomy and adenoidectomy  1939  . Dilation and curettage of uterus  1954, 1978, 1979  . Abdominal hysterectomy  1980  . Knee surgery  2004    left knee  . Cataract extraction  2010  . Breast surgery      L breast cyst  . Appendectomy      Family History  Problem Relation Age of Onset  . Breast cancer Mother     metastatic disease  . Kidney disease Father     Bright's disease  . Cancer Maternal Grandfather     prostate  . Obesity Daughter   . Thyroid disease Son   . Hyperlipidemia Son     Social History   Social History  . Marital Status: Widowed    Spouse Name: N/A  . Number of Children: N/A  . Years of Education: N/A   Occupational History  . Not on file.   Social History Main Topics  . Smoking status: Former Smoker -- 0.50 packs/day for 40 years    Types: Cigarettes    Start date: 06/09/1940   Quit date: 04/23/1979  . Smokeless tobacco: Never Used     Comment: quit smoking 40 years ago  . Alcohol Use: Yes     Comment: socially  . Drug Use: No  . Sexual Activity: Not Currently    Birth Control/ Protection: Post-menopausal     Comment: moved to [place], widowed. no dietary restrictions   Other Topics Concern  . Not on file   Social History Narrative    Outpatient Prescriptions Prior to Visit  Medication Sig Dispense Refill  . calcium-vitamin D (OSCAL WITH D) 500-200 MG-UNIT per tablet Take 1 tablet by mouth.    . levothyroxine (SYNTHROID, LEVOTHROID) 88 MCG tablet Take 1 tablet (88 mcg total) by mouth daily before breakfast. Monday - Friday 88 MCG  Saturday AND Sunday 1/2 TAB OR 44 MCG. 90 tablet 1  . psyllium (METAMUCIL) 58.6 % packet Take 1 packet by mouth daily.     No facility-administered medications prior to visit.    Allergies  Allergen Reactions  . Lipitor [Atorvastatin Calcium] Other (See Comments)    Myalgia     Review of Systems  Constitutional: Negative for fever, chills and malaise/fatigue.  HENT: Negative for congestion  and hearing loss.   Eyes: Negative for discharge.  Respiratory: Negative for cough, sputum production and shortness of breath.   Cardiovascular: Negative for chest pain, palpitations and leg swelling.  Gastrointestinal: Negative for heartburn, nausea, vomiting, abdominal pain, diarrhea, constipation and blood in stool.  Genitourinary: Positive for urgency and frequency. Negative for dysuria and hematuria.  Musculoskeletal: Negative for myalgias, back pain and falls.  Skin: Negative for rash.  Neurological: Negative for dizziness, sensory change, loss of consciousness, weakness and headaches.  Endo/Heme/Allergies: Negative for environmental allergies. Does not bruise/bleed easily.  Psychiatric/Behavioral: Negative for depression and suicidal ideas. The patient is not nervous/anxious and does not have insomnia.        Objective:     Physical Exam  Constitutional: She is oriented to person, place, and time. She appears well-developed and well-nourished. No distress.  HENT:  Head: Normocephalic and atraumatic.  Eyes: Conjunctivae are normal.  Neck: Neck supple. No thyromegaly present.  Cardiovascular: Normal rate, regular rhythm and normal heart sounds.   No murmur heard. Pulmonary/Chest: Effort normal and breath sounds normal. No respiratory distress.  Abdominal: Soft. Bowel sounds are normal. She exhibits no distension and no mass. There is no tenderness.  Musculoskeletal: She exhibits no edema.  Lymphadenopathy:    She has no cervical adenopathy.  Neurological: She is alert and oriented to person, place, and time.  Skin: Skin is warm and dry.  Psychiatric: She has a normal mood and affect. Her behavior is normal.    BP 142/88 mmHg  Pulse 65  Temp(Src) 97.9 F (36.6 C) (Oral)  Ht '5\' 5"'$  (1.651 m)  Wt 152 lb 4 oz (69.06 kg)  BMI 25.34 kg/m2  SpO2 96% Wt Readings from Last 3 Encounters:  02/06/15 152 lb 4 oz (69.06 kg)  11/15/14 148 lb (67.132 kg)  08/08/14 149 lb (67.586 kg)     Lab Results  Component Value Date   WBC 4.8 11/15/2014   HGB 14.5 11/15/2014   HCT 43.5 11/15/2014   PLT 245 11/15/2014   GLUCOSE 87 02/06/2015   CHOL 221* 02/06/2015   TRIG 124.0 02/06/2015   HDL 59.10 02/06/2015   LDLCALC 137* 02/06/2015   ALT 12 02/06/2015   AST 17 02/06/2015   NA 143 02/06/2015   K 3.9 02/06/2015   CL 107 02/06/2015   CREATININE 0.59 02/06/2015   BUN 13 02/06/2015   CO2 30 02/06/2015   TSH 0.877 11/15/2014    Lab Results  Component Value Date   TSH 0.877 11/15/2014   Lab Results  Component Value Date   WBC 4.8 11/15/2014   HGB 14.5 11/15/2014   HCT 43.5 11/15/2014   MCV 89.5 11/15/2014   PLT 245 11/15/2014   Lab Results  Component Value Date   NA 143 02/06/2015   K 3.9 02/06/2015   CO2 30 02/06/2015   GLUCOSE 87 02/06/2015   BUN 13 02/06/2015   CREATININE 0.59 02/06/2015    BILITOT 0.7 02/06/2015   ALKPHOS 63 02/06/2015   AST 17 02/06/2015   ALT 12 02/06/2015   PROT 6.7 02/06/2015   ALBUMIN 4.0 02/06/2015   CALCIUM 9.8 02/06/2015   GFR 102.11 02/06/2015   Lab Results  Component Value Date   CHOL 221* 02/06/2015   Lab Results  Component Value Date   HDL 59.10 02/06/2015   Lab Results  Component Value Date   LDLCALC 137* 02/06/2015   Lab Results  Component Value Date   TRIG 124.0 02/06/2015   Lab Results  Component Value Date   CHOLHDL 4 02/06/2015   No results found for: HGBA1C     Assessment & Plan:   Problem List Items Addressed This Visit    Vitamin D deficiency - Primary    Recheck today      Relevant Orders   Vit D  25 hydroxy (rtn osteoporosis monitoring) (Completed)   Lipid panel (Completed)   Comprehensive metabolic panel (Completed)   Osteopenia  Dexa done 05/27/2014 normal     Stay active, calcium 1500 mg daily, vitamin d supplements daily      Incontinence in female    Happens daily but not constatnly, has done Pelvic floor therapy in past, declines meds       Relevant Orders   Ambulatory referral to Physical Therapy   Hypothyroidism    On Levothyroxine, continue to monitor      Relevant Orders   Vit D  25 hydroxy (rtn osteoporosis monitoring) (Completed)   Lipid panel (Completed)   Comprehensive metabolic panel (Completed)   Hyperlipidemia, mixed    Encouraged heart healthy diet, increase exercise, avoid trans fats, consider a krill oil cap daily      GERD (gastroesophageal reflux disease)    Avoid offending foods, start probiotics. Do not eat large meals in late evening and consider raising head of bed.          I am having Ms. Hawker maintain her levothyroxine, calcium-vitamin D, and psyllium.  No orders of the defined types were placed in this encounter.     Penni Homans, MD

## 2015-02-12 NOTE — Assessment & Plan Note (Signed)
Stay active, calcium 1500 mg daily, vitamin d supplements daily

## 2015-02-15 ENCOUNTER — Ambulatory Visit: Payer: Medicare Other | Admitting: Physical Therapy

## 2015-02-20 ENCOUNTER — Ambulatory Visit: Payer: Medicare Other | Attending: Family Medicine | Admitting: Physical Therapy

## 2015-02-20 ENCOUNTER — Encounter: Payer: Self-pay | Admitting: Physical Therapy

## 2015-02-20 DIAGNOSIS — N8184 Pelvic muscle wasting: Secondary | ICD-10-CM | POA: Diagnosis not present

## 2015-02-20 DIAGNOSIS — N907 Vulvar cyst: Secondary | ICD-10-CM | POA: Diagnosis not present

## 2015-02-20 DIAGNOSIS — N8189 Other female genital prolapse: Secondary | ICD-10-CM

## 2015-02-20 DIAGNOSIS — M6289 Other specified disorders of muscle: Secondary | ICD-10-CM

## 2015-02-20 NOTE — Therapy (Signed)
[facility] [address] [place], [place], [address] Phone: [phone]   Fax:  [phone]  Physical Therapy Evaluation  Patient Details  Name: Kylie Walters MRN: [id number] Date of Birth: Jan 05, 1925 Referring Provider: Dr. Penni Homans  Encounter Date: 02/20/2015      PT End of Session - 02/20/15 1515    Visit Number 1   Date for PT Re-Evaluation 04/17/15   PT Start Time 1525   PT Stop Time 1605   PT Time Calculation (min) 40 min   Activity Tolerance Patient limited by pain   Behavior During Therapy [facility] for tasks assessed/performed      Past Medical History  Diagnosis Date  . Hypothyroidism   . Osteopenia   . Diverticulosis   . Granuloma of skin     RLL  . Vitamin D deficiency   . GERD (gastroesophageal reflux disease)   . Hyperlipidemia   . Hearing loss   . Arthritis   . Depression 4/03  . Lumbar herniated disc   . Hyperlipidemia, mixed 05/24/2014  . Diverticulosis of colon without hemorrhage 10/07/2011  . History of chicken pox   . H/O measles   . H/O mumps   . Incontinence in female 02/06/2015    Past Surgical History  Procedure Laterality Date  . Tonsillectomy and adenoidectomy  1939  . Dilation and curettage of uterus  1954, 1978, 1979  . Abdominal hysterectomy  1980  . Knee surgery  2004    left knee  . Cataract extraction  2010  . Breast surgery      L breast cyst  . Appendectomy      There were no vitals filed for this visit.  Visit Diagnosis:  Pelvic floor dysfunction - Plan: PT plan of care cert/re-cert  Perineal floor weakness - Plan: PT plan of care cert/re-cert      Subjective Assessment - 02/20/15 1527    Subjective Patient reports she is having urinary leakage.  Patient uses 1 pad and depends. Uses 5 pads per day. 1 pad per night.  Patient gets u pevery couple of hours. Tomatos cooked aggravate my bladder.   Patient Stated Goals reduce leakage   Currently in Pain?  No/denies            [facility] PT Assessment - 02/20/15 0001    Assessment   Medical Diagnosis N39.3 Incontinence in female   Referring Provider Dr. Penni Homans   Prior Therapy yes, biofeedback   Precautions   Precautions None   Balance Screen   Has the patient fallen in the past 6 months No   Has the patient had a decrease in activity level because of a fear of falling?  No   Is the patient reluctant to leave their home because of a fear of falling?  No   Home Environment   Living Environment --  Independent living facility   Prior Function   Level of Independence Independent   Vocation Retired   Leisure pool exercise   Cognition   Overall Cognitive Status Within Functional Limits for tasks assessed  trouble hearing   Observation/Other Assessments   Focus on Therapeutic Outcomes (FOTO)  57% limittaion CK  goal is 30% limitation CJ   ROM / Strength   AROM / PROM / Strength Strength;AROM   AROM   Lumbar Extension decreased by 50% and started to tip backward   Strength   Overall Strength Comments bil. hip rotators 3+/5, bil. hip abd 4/5  Pelvic Floor Special Questions - 02/20/15 0001    Urinary Leakage Yes   Pad use 5  1 pad per night   Activities that cause leaking Other  no control of urine   Urinary urgency Yes   Caffeine beverages 2 decaf coffees   Pelvic Floor Internal Exam Patient confirmed identification and approved physical therapis to assess pelvic floor muscle strength and integrity   Exam Type Vaginal   Strength fair squeeze, definite lift   Strength # of reps 5   Strength # of seconds 10                  PT Education - 02/20/15 1558    Education provided Yes   Education Details pelvic floor strengthening in [employer]) Educated Patient   Methods Explanation;Demonstration;Verbal cues;Handout   Comprehension Verbalized understanding;Returned demonstration          PT Short Term Goals - 02/20/15 1604     PT SHORT TERM GOAL #1   Title understand what bladder irritants are and how they increase urinary leakage   Time 4   Period Weeks   Status New   PT SHORT TERM GOAL #2   Title pads are 25% dryer due to increased pelvic floor strength   Time 4   Period Weeks   Status New   PT SHORT TERM GOAL #3   Title understand what timed voiding and able to void every 2 hours   Time 4   Period Weeks   Status New           PT Long Term Goals - 02/20/15 1605    PT LONG TERM GOAL #1   Title Independent with HEP for pelvic floor strengthening   Time 8   Period Weeks   Status New   PT LONG TERM GOAL #2   Title pads are 50% dryer due to increased pelvic floor strength   Time 8   Period Weeks   Status New   PT LONG TERM GOAL #3   Title pelvic floor strength is 4/5 holding 10 sec.    Time 8   Period Weeks   Status New   PT LONG TERM GOAL #4   Title wear 1-2 pads per day due to increased pelvic floor strength   Time 8   Period Weeks   Status New               Plan - 02/20/15 1559    Clinical Impression Statement Patient is a 81 year old female with diagnosis of incontinence in female for the past 6 months.  Patient has had physical therapy in the past which helped reduce her urinary leakage.  FOTO score is 57% limitation.  Patient reports no pain.  Patient uses 5 pads per day and 1 pad per night.  Pelvic floor strength is 3/5 holding for 10 seconds  and able to contract the lateral sides of pelvic floor better with hip adduction.  Patient has vaginal dryness. Bil. hip abduction is 4/5 and hip rotators is 3+/5.  Lumbar extension is decreased by 50%. Patient will benefit from physical therapy to reduce urinary leakage and increased pelvic floor strength.    Pt will benefit from skilled therapeutic intervention in order to improve on the following deficits Decreased strength;Decreased endurance   Rehab Potential Good   Clinical Impairments Affecting Rehab Potential None   PT Frequency 1x  / week   PT Duration 8 weeks   PT Treatment/Interventions  ADLs/Self Care Home Management;Biofeedback;Electrical Stimulation;Therapeutic exercise;Therapeutic activities;Neuromuscular re-education;Patient/family education;Manual techniques   PT Next Visit Plan pelvic floor exercises, Pelvic floor EMG, bladder irritants, voiding schedule, hip exercises   PT Home Exercise Plan hip exercises, timed voiding   Recommended Other Services None   Consulted and Agree with Plan of Care Patient          G-Codes - 03-11-15 1520    Functional Assessment Tool Used FOTO limitation is 57% limitation   Goal is 30% limitation CJ   Functional Limitation Other PT primary   Other PT Primary Current Status (B7048) At least 40 percent but less than 60 percent impaired, limited or restricted   Other PT Primary Goal Status (G8916) At least 20 percent but less than 40 percent impaired, limited or restricted  30% limitation       Problem List Patient Active Problem List   Diagnosis Date Noted  . Incontinence in female 02/06/2015  . History of chicken pox   . Hyperlipidemia, mixed 05/24/2014  . Osteoarthritis 05/29/2013  . 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 Tobacco use disorder  Quit smoking 35 years ago  11/16/2012  . Calcified granuloma of lung ([place]) 11/16/2012  . Sinus bradycardia on ECG 11/16/2012  . TMJ arthralgia 03/10/2012  . Hypothyroidism 10/07/2011  . Osteopenia  Dexa done 05/27/2014 normal  10/07/2011  . Diverticulosis of colon without hemorrhage 10/07/2011  . Vitamin D deficiency 10/07/2011  . GERD (gastroesophageal reflux disease) 10/07/2011  . Hearing loss 10/07/2011  . DJD (degenerative joint disease) 10/07/2011  . History of depression 10/07/2011  . Disc herniation 10/07/2011  . H/O hysterectomy with oophorectomy 10/07/2011  . Family history of breast cancer in first degree relative 10/07/2011  . Thyroid cyst 10/07/2011    GRAY,CHERYL,PT March 11, 2015, 4:08 PM  [facility] [address], [place], [place], [address] Phone: [phone]   Fax:  [phone]  Name: Kylie Walters MRN: [id number] Date of Birth: 12/07/1924

## 2015-02-20 NOTE — Patient Instructions (Signed)
Quick Contraction: Gravity Eliminated (Hook-Lying)    Lie with hips and knees bent. Quickly squeeze then fully relax pelvic floor. Perform __1_ sets of _5__. Rest for _1__ seconds between sets. Do 3___ times a day.   Copyright  VHI. All rights reserved.  Adduction: Hip - Knees Together (Hook-Lying)    Lie with hips and knees bent, towel roll or pillow between knees. Push knees together. Hold for 10___ seconds. Rest for _5__ seconds. Repeat _10__ times. Do _3__ times a day.   Copyright  VHI. All rights reserved.  [facility] [address] [place], [place] [address] Phone # [phone] Fax [phone]

## 2015-03-08 ENCOUNTER — Ambulatory Visit: Payer: Medicare Other | Attending: Family Medicine | Admitting: Physical Therapy

## 2015-03-08 ENCOUNTER — Encounter: Payer: Self-pay | Admitting: Physical Therapy

## 2015-03-08 DIAGNOSIS — N907 Vulvar cyst: Secondary | ICD-10-CM | POA: Diagnosis not present

## 2015-03-08 DIAGNOSIS — M6289 Other specified disorders of muscle: Secondary | ICD-10-CM

## 2015-03-08 DIAGNOSIS — N8184 Pelvic muscle wasting: Secondary | ICD-10-CM | POA: Diagnosis not present

## 2015-03-08 DIAGNOSIS — N8189 Other female genital prolapse: Secondary | ICD-10-CM

## 2015-03-08 NOTE — Patient Instructions (Signed)
Certain foods and liquids will decrease the pH making the urine more acidic.  Urinary urgency increases when the urine has a low pH.  Most common irritants: alcohol, carbonated beverages and caffinated beverages.  Foods to avoid: apple juice, apples, ascorbic acid, canteloupes, chili, citrus fruits, coffee, cranberries, grapes, guava, peaches, pepper, pineapple, plums, strawberries, tea, tomatoes, and vinegar.  Drinking plenty of water may help to increase the pH and dilute out any of the effects of specific irritants.  Foods that are NOT irritating to the bladder include: Pears, papayas, sun-brewed teas, watermelons, non-citrus herbal teas, apricots, kava and low-acid instant drinks (Postum)  [facility] [address] [place],  [address] Phone # [phone] Fax [phone]

## 2015-03-08 NOTE — Therapy (Signed)
[facility] [address] [place], [place], [address] Phone: [phone]   Fax:  [phone]  Physical Therapy Treatment  Patient Details  Name: Kylie Walters MRN: [id number] Date of Birth: 1924/05/21 Referring Provider: Dr. Penni Homans  Encounter Date: 03/08/2015      PT End of Session - 03/08/15 1154    Visit Number 2   Number of Visits 10  Medicare   Date for PT Re-Evaluation 04/17/15   PT Start Time 1150   PT Stop Time 1229   PT Time Calculation (min) 39 min   Activity Tolerance Patient limited by pain   Behavior During Therapy [facility] for tasks assessed/performed      Past Medical History  Diagnosis Date  . Hypothyroidism   . Osteopenia   . Diverticulosis   . Granuloma of skin     RLL  . Vitamin D deficiency   . GERD (gastroesophageal reflux disease)   . Hyperlipidemia   . Hearing loss   . Arthritis   . Depression 4/03  . Lumbar herniated disc   . Hyperlipidemia, mixed 05/24/2014  . Diverticulosis of colon without hemorrhage 10/07/2011  . History of chicken pox   . H/O measles   . H/O mumps   . Incontinence in female 02/06/2015    Past Surgical History  Procedure Laterality Date  . Tonsillectomy and adenoidectomy  1939  . Dilation and curettage of uterus  1954, 1978, 1979  . Abdominal hysterectomy  1980  . Knee surgery  2004    left knee  . Cataract extraction  2010  . Breast surgery      L breast cyst  . Appendectomy      There were no vitals filed for this visit.  Visit Diagnosis:  Pelvic floor dysfunction  Perineal floor weakness      Subjective Assessment - 03/08/15 1154    Subjective Sometimes I only do the exercises 1-3 times per day. It bothers my back and I get a pull in my neck.    Patient Stated Goals reduce leakage   Currently in Pain? No/denies                      Pelvic Floor Special Questions - 03/08/15 0001    Biofeedback resting level  4.40 uv ; 3  quick flicks average 1.02 uv max 10.73 uv; 10 second contraction average 3.76 uv max 9.27 uv, 20 second 3.86 uv, rest  2.01 uv   Biofeedback sensor type Vaginal   Biofeedback Activity Other;10 second hold;Quick contraction  assessment; used ball with contraction                   PT Education - 03/08/15 1219    Education provided Yes   Education Details Diet with bladder irritants   Person(s) Educated Patient   Methods Explanation;Handout;Verbal cues   Comprehension Returned demonstration;Verbalized understanding          PT Short Term Goals - 03/08/15 1218    PT SHORT TERM GOAL #1   Title understand what bladder irritants are and how they increase urinary leakage   Time 4   Period Weeks   Status Achieved   PT SHORT TERM GOAL #2   Title pads are 25% dryer due to increased pelvic floor strength   Time 4   Period Weeks   Status On-going   PT SHORT TERM GOAL #3   Title understand what timed voiding and able  to void every 2 hours   Time 4   Period Weeks   Status New           PT Long Term Goals - 02/20/15 1605    PT LONG TERM GOAL #1   Title Independent with HEP for pelvic floor strengthening   Time 8   Period Weeks   Status New   PT LONG TERM GOAL #2   Title pads are 50% dryer due to increased pelvic floor strength   Time 8   Period Weeks   Status New   PT LONG TERM GOAL #3   Title pelvic floor strength is 4/5 holding 10 sec.    Time 8   Period Weeks   Status New   PT LONG TERM GOAL #4   Title wear 1-2 pads per day due to increased pelvic floor strength   Time 8   Period Weeks   Status New               Plan - 03/08/15 1219    Clinical Impression Statement Patient is a 81 year old female with diagnosis of incontinence in female.  Pelvic floor EMG assessment: resting level 6.2HU, 3 quick flicks average 7.65 uv, max 10.73 uv, 10 second contraction average 3.76 uv max 9.27 uv, 20 second contraction 3.86 uv.  Patient has difficulty  contracting for more than 2 second.  Patient wil lpush sensor out at times.  Patient will benefit from physical therapy to increase pelvic floor strength.    Pt will benefit from skilled therapeutic intervention in order to improve on the following deficits Decreased strength;Decreased endurance   Rehab Potential Good   Clinical Impairments Affecting Rehab Potential None   PT Frequency 1x / week   PT Duration 8 weeks   PT Treatment/Interventions ADLs/Self Care Home Management;Biofeedback;Electrical Stimulation;Therapeutic exercise;Therapeutic activities;Neuromuscular re-education;Patient/family education;Manual techniques   PT Next Visit Plan pelvic floor exercises, Pelvic floor EMG,, voiding schedule, hip exercises   Consulted and Agree with Plan of Care Patient        Problem List Patient Active Problem List   Diagnosis Date Noted  . Incontinence in female 02/06/2015  . History of chicken pox   . Hyperlipidemia, mixed 05/24/2014  . Osteoarthritis 05/29/2013  . 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 Tobacco use disorder  Quit smoking 35 years ago  11/16/2012  . Calcified granuloma of lung ([place]) 11/16/2012  . Sinus bradycardia on ECG 11/16/2012  . TMJ arthralgia 03/10/2012  . Hypothyroidism 10/07/2011  . Osteopenia  Dexa done 05/27/2014 normal  10/07/2011  . Diverticulosis of colon without hemorrhage 10/07/2011  . Vitamin D deficiency 10/07/2011  . GERD (gastroesophageal reflux disease) 10/07/2011  . Hearing loss 10/07/2011  . DJD (degenerative joint disease) 10/07/2011  . History of depression 10/07/2011  . Disc herniation 10/07/2011  . H/O hysterectomy with oophorectomy 10/07/2011  . Family history of breast cancer in first degree relative 10/07/2011  . Thyroid cyst 10/07/2011    Navie Lamoreaux,PT 03/08/2015, 12:31 PM  [facility] [address], [place], [place], [address] Phone: [phone]   Fax:   [phone]  Name: KAYLIEGH BOYERS MRN: [id number] Date of Birth: 01/19/1925

## 2015-03-22 ENCOUNTER — Encounter: Payer: Self-pay | Admitting: Physical Therapy

## 2015-03-22 ENCOUNTER — Ambulatory Visit: Payer: Medicare Other | Admitting: Physical Therapy

## 2015-03-22 DIAGNOSIS — M6289 Other specified disorders of muscle: Secondary | ICD-10-CM

## 2015-03-22 DIAGNOSIS — N8189 Other female genital prolapse: Secondary | ICD-10-CM

## 2015-03-22 DIAGNOSIS — N8184 Pelvic muscle wasting: Secondary | ICD-10-CM | POA: Diagnosis not present

## 2015-03-22 DIAGNOSIS — N907 Vulvar cyst: Secondary | ICD-10-CM | POA: Diagnosis not present

## 2015-03-22 NOTE — Therapy (Signed)
[facility] [address] [place], [place], [address] Phone: [phone]   Fax:  [phone]  Physical Therapy Treatment  Patient Details  Name: NDIDI NESBY MRN: [id number] Date of Birth: 1924-10-23 Referring Provider: Dr. Penni Homans  Encounter Date: 03/22/2015      PT End of Session - 03/22/15 1106    Visit Number 3   Number of Visits 10  Medicare   Date for PT Re-Evaluation 04/17/15   PT Start Time 1105   PT Stop Time 1145   PT Time Calculation (min) 40 min   Activity Tolerance Patient limited by pain   Behavior During Therapy [facility] for tasks assessed/performed      Past Medical History  Diagnosis Date  . Hypothyroidism   . Osteopenia   . Diverticulosis   . Granuloma of skin     RLL  . Vitamin D deficiency   . GERD (gastroesophageal reflux disease)   . Hyperlipidemia   . Hearing loss   . Arthritis   . Depression 4/03  . Lumbar herniated disc   . Hyperlipidemia, mixed 05/24/2014  . Diverticulosis of colon without hemorrhage 10/07/2011  . History of chicken pox   . H/O measles   . H/O mumps   . Incontinence in female 02/06/2015    Past Surgical History  Procedure Laterality Date  . Tonsillectomy and adenoidectomy  1939  . Dilation and curettage of uterus  1954, 1978, 1979  . Abdominal hysterectomy  1980  . Knee surgery  2004    left knee  . Cataract extraction  2010  . Breast surgery      L breast cyst  . Appendectomy      There were no vitals filed for this visit.  Visit Diagnosis:  Pelvic floor dysfunction  Perineal floor weakness      Subjective Assessment - 03/22/15 1107    Subjective I have more good times than not.  I limit my fluids. I go to the bathroom every 2 hours.    Patient Stated Goals reduce leakage   Currently in Pain? No/denies                         [facility] Adult PT Treatment/Exercise - 03/22/15 0001    Self-Care   Self-Care Other Self-Care  Comments   Other Self-Care Comments  timed voiding and where to get a timer                PT Education - 03/22/15 1137    Education provided Yes   Education Details pool exercises, pelvic floor exercises in sitting   Person(s) Educated Patient   Methods Explanation;Demonstration;Verbal cues;Handout   Comprehension Returned demonstration;Verbalized understanding          PT Short Term Goals - 03/22/15 1137    PT SHORT TERM GOAL #1   Title understand what bladder irritants are and how they increase urinary leakage   Time 4   Period Weeks   Status Achieved   PT SHORT TERM GOAL #2   Title pads are 25% dryer due to increased pelvic floor strength   Time 4   Period Weeks   Status Achieved   PT SHORT TERM GOAL #3   Title understand what timed voiding and able to void every 2 hours   Time 4   Period Weeks   Status Achieved           PT Long Term Goals -  02/20/15 1605    PT LONG TERM GOAL #1   Title Independent with HEP for pelvic floor strengthening   Time 8   Period Weeks   Status New   PT LONG TERM GOAL #2   Title pads are 50% dryer due to increased pelvic floor strength   Time 8   Period Weeks   Status New   PT LONG TERM GOAL #3   Title pelvic floor strength is 4/5 holding 10 sec.    Time 8   Period Weeks   Status New   PT LONG TERM GOAL #4   Title wear 1-2 pads per day due to increased pelvic floor strength   Time 8   Period Weeks   Status New               Plan - 03/22/15 1141    Clinical Impression Statement Patient is a 81 year old female with diagnosis of incontinence in female.  Patient has met her STG's. Patient reports her urinary incontinence is 25% better.  She is not changing her pad as often.  Patient was instructed on timed voiding for every 2 hours.  Patient is learning more pelvic floor exercises and hip exercises.    Pt will benefit from skilled therapeutic intervention in order to improve on the following deficits Decreased  strength;Decreased endurance   Rehab Potential Good   Clinical Impairments Affecting Rehab Potential None   PT Frequency 1x / week   PT Duration 8 weeks   PT Treatment/Interventions ADLs/Self Care Home Management;Biofeedback;Electrical Stimulation;Therapeutic exercise;Therapeutic activities;Neuromuscular re-education;Patient/family education;Manual techniques   PT Next Visit Plan pelvic floor exercises, Pelvic floor EMG,,renewal note    PT Home Exercise Plan Progress as needed   Consulted and Agree with Plan of Care Patient        Problem List Patient Active Problem List   Diagnosis Date Noted  . Incontinence in female 02/06/2015  . History of chicken pox   . Hyperlipidemia, mixed 05/24/2014  . Osteoarthritis 05/29/2013  . 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 Tobacco use disorder  Quit smoking 35 years ago  11/16/2012  . Calcified granuloma of lung ([place]) 11/16/2012  . Sinus bradycardia on ECG 11/16/2012  . TMJ arthralgia 03/10/2012  . Hypothyroidism 10/07/2011  . Osteopenia  Dexa done 05/27/2014 normal  10/07/2011  . Diverticulosis of colon without hemorrhage 10/07/2011  . Vitamin D deficiency 10/07/2011  . GERD (gastroesophageal reflux disease) 10/07/2011  . Hearing loss 10/07/2011  . DJD (degenerative joint disease) 10/07/2011  . History of depression 10/07/2011  . Disc herniation 10/07/2011  . H/O hysterectomy with oophorectomy 10/07/2011  . Family history of breast cancer in first degree relative 10/07/2011  . Thyroid cyst 10/07/2011    Lecretia Buczek,PT 03/22/2015, 11:44 AM  [facility] [address] [place], [place], [address] Phone: [phone]   Fax:  [phone]  Name: BOSTON CATARINO MRN: [id number] Date of Birth: 09-23-1924

## 2015-03-22 NOTE — Patient Instructions (Addendum)
Go to the bathroom every 2 hours. Can get a timer at [facility] that registers hours.     Adduction: Hip - Knees Together With Pelvic Floor (Sitting)    Sit with towel roll between knees. Squeeze pelvic floor while pushing knees together. Hold for _5__ seconds. Rest for __5_ seconds. Repeat __10_ times. Do __2_ times a day.  Copyright  VHI. All rights reserved.  External Rotation: Hip - Knees Apart With Pelvic Floor (Sitting)    Sit, band tied just above knees. Squeeze pelvic floor while pulling knees apart. Hold for _5__ seconds. Rest for _5__ seconds. Repeat _10__ times. Do __2_ times a day.  Copyright  VHI. All rights reserved.    Hip Flexion, Knee Bent    Lift right leg toward chest with knee bent. Repeat _20___ times per session. Do _1___ sessions per week.  Copyright  VHI. All rights reserved.    Hip Internal / External Rotation, Hip / Knee Bent    Lift and bend left leg so thigh is parallel to water surface. Rotate thigh inward so foot moves outward. Then rotate thigh outward so foot moves across midline. Repeat sequence _10___ times per session. Do _1___ sessions per week. Position: Standing   Copyright  VHI. All rights reserved.  Leg Circle    Raise left leg forward to comfortable height, knee straight. Circle same leg in front of body. Repeat __10__ times clockwise, then counterclockwise to complete session. Do __1__ sessions per week.  Copyright  VHI. All rights reserved.  Hip Extension, Knee Bent    Bend right knee to 90. Move leg back with same knee bent. Repeat __10__ times per session. Do _1___ sessions per week.  Copyright  VHI. All rights reserved.  Hip Lateral Abduction / Adduction Past Midline    Lift right leg out to side. Then pull same leg across front of other leg. Repeat sequence _10___ times per session. Do _1___ sessions per week.  Copyright  VHI. All rights reserved.   [facility] [address],  [place], [place] [address] Phone # [phone] Fax [phone]

## 2015-04-05 ENCOUNTER — Ambulatory Visit: Payer: Medicare Other | Attending: Family Medicine | Admitting: Physical Therapy

## 2015-04-05 ENCOUNTER — Encounter: Payer: Self-pay | Admitting: Physical Therapy

## 2015-04-05 DIAGNOSIS — N8184 Pelvic muscle wasting: Secondary | ICD-10-CM | POA: Diagnosis not present

## 2015-04-05 DIAGNOSIS — M6289 Other specified disorders of muscle: Secondary | ICD-10-CM

## 2015-04-05 DIAGNOSIS — N8189 Other female genital prolapse: Secondary | ICD-10-CM

## 2015-04-05 DIAGNOSIS — N907 Vulvar cyst: Secondary | ICD-10-CM | POA: Insufficient documentation

## 2015-04-05 NOTE — Patient Instructions (Signed)
Squat    Bend both knees lowering body. Straighten knees and raise body as you  Pull up your pelvic floor hold 5 sec 10 times per session. Do _2___ sessions per week.  Copyright  VHI. All rights reserved.  Side Bend    Stand with feet shoulder width apart, arms at sides. Bend to one side, reaching down toward knee with hand. Return to standing as you pull up your pelvic floor,  bend to other side. Repeat sequence _10___ times per session. Do __2__ sessions per week. Variation: Hands on hips Arms at shoulder level Arms over head  Copyright  VHI. All rights reserved.   Trunk Rotation    Stand with feet shoulder width apart, hands together, knees slightly bent. Rotate shoulders and upper trunk to one side, pelvis held in neutral and facing forward. Return to center as you pull up the pelvic floor and rotate to other side. Repeat sequence __10__ times per session. Do __2__ sessions per week.  Copyright  VHI. All rights reserved.  Pelvic Posterior Tilt    Stand with upper back and buttocks touching pool wall, feet __6_ inches from wall, knees relaxed. Contract abdominal muscles, flattening low back against wall and tighten the pelvic floor.  Repeat __10__ times per session. Do __2__ sessions per week. Progression: Perform pelvic tilt away from wall.  Copyright  VHI. All rights reserved.  Upper Body, Elbow Straight, Overhead Flexion = Mid > Side    Stand, arms at sides. A.Flexion: Move both arms up, out, and overhead. B.Extension: Move both arms down and across body as you breath out and tighten pelvic floor.  Repeat _10___ times per session. Do _2___ sessions per week. Variation: Sitting  Copyright  VHI. All rights reserved.   [facility] [address] [place], [place] [address] Phone # [phone] Fax [phone]

## 2015-04-05 NOTE — Therapy (Signed)
[facility] [address] [place], [place], [address] Phone: [phone]   Fax:  [phone]  Physical Therapy Treatment  Patient Details  Name: Kylie Walters MRN: [id number] Date of Birth: 13-Jan-1925 Referring Provider: Dr. Penni Homans  Encounter Date: 04/05/2015      PT End of Session - 04/05/15 1214    Visit Number 4   Number of Visits 10  Medicare   Date for PT Re-Evaluation 06/12/15   PT Start Time 1660   PT Stop Time 1225   PT Time Calculation (min) 40 min   Activity Tolerance Patient limited by pain   Behavior During Therapy [facility] for tasks assessed/performed      Past Medical History  Diagnosis Date  . Hypothyroidism   . Osteopenia   . Diverticulosis   . Granuloma of skin     RLL  . Vitamin D deficiency   . GERD (gastroesophageal reflux disease)   . Hyperlipidemia   . Hearing loss   . Arthritis   . Depression 4/03  . Lumbar herniated disc   . Hyperlipidemia, mixed 05/24/2014  . Diverticulosis of colon without hemorrhage 10/07/2011  . History of chicken pox   . H/O measles   . H/O mumps   . Incontinence in female 02/06/2015    Past Surgical History  Procedure Laterality Date  . Tonsillectomy and adenoidectomy  1939  . Dilation and curettage of uterus  1954, 1978, 1979  . Abdominal hysterectomy  1980  . Knee surgery  2004    left knee  . Cataract extraction  2010  . Breast surgery      L breast cyst  . Appendectomy      There were no vitals filed for this visit.  Visit Diagnosis:  Pelvic floor dysfunction - Plan: PT plan of care cert/re-cert  Perineal floor weakness - Plan: PT plan of care cert/re-cert      Subjective Assessment - 04/05/15 1144    Subjective Typically I can go to the bathroom every 2 hours.  There was one day I had to go every 45 minutes and had burning in the urethra. I am not changing the pads as much. Since I have been doing my hip exercises, I have less urinary  leakage.    Patient Stated Goals reduce leakage   Currently in Pain? No/denies            [facility] PT Assessment - 04/05/15 0001    Assessment   Medical Diagnosis N39.3 Incontinence in female   Referring Provider Dr. Penni Homans   Prior Therapy yes, biofeedback   Precautions   Precautions None   Balance Screen   Has the patient fallen in the past 6 months No   Has the patient had a decrease in activity level because of a fear of falling?  No   Is the patient reluctant to leave their home because of a fear of falling?  No   Prior Function   Level of Independence Independent   Vocation Retired   Leisure pool exercise   Cognition   Overall Cognitive Status Within Functional Limits for tasks assessed  trouble hearing   Strength   Overall Strength Comments bil. hip rotators 4-/5, bil. hip abd 4+/5                  Pelvic Floor Special Questions - 04/05/15 0001    Pelvic Floor Internal Exam Patient confirmed identification and approved physical therapis to assess  pelvic floor muscle strength and integrity   Exam Type Vaginal   Strength --  4-/5   Strength # of seconds 10                   PT Education - 04/05/15 1214    Education provided Yes   Education Details pelvic floor exercises in the pool   Person(s) Educated Patient   Methods Explanation;Demonstration;Handout   Comprehension Returned demonstration          PT Short Term Goals - 04/05/15 1147    PT SHORT TERM GOAL #1   Title understand what bladder irritants are and how they increase urinary leakage   Time 4   Period Weeks   Status Achieved   PT SHORT TERM GOAL #2   Title pads are 25% dryer due to increased pelvic floor strength   Time 4   Period Weeks   Status Achieved   PT SHORT TERM GOAL #3   Title understand what timed voiding and able to void every 2 hours   Time 4   Period Weeks   Status Achieved           PT Long Term Goals - 04/05/15 1148    PT LONG TERM GOAL #1    Title Independent with HEP for pelvic floor strengthening   Time 8   Period Weeks   Status On-going  learning exercises   PT LONG TERM GOAL #2   Title pads are 50% dryer due to increased pelvic floor strength   Time 8   Period Weeks   Status On-going   PT LONG TERM GOAL #3   Title pelvic floor strength is 4/5 holding 10 sec.    Time 8   Period Weeks   Status On-going   PT LONG TERM GOAL #4   Title wear 1-2 pads per day due to increased pelvic floor strength   Time 8   Period Weeks   Status On-going               Plan - 04/05/15 1216    Clinical Impression Statement Patient is a 81 year old female with diagnosis of incontinence in female.  Patient reports her second pad she wears is dry in the past 2 weeks.  Patient is able to wait 2 hours to go to the bathroom.  When patient gets up to go to the bathroom she will slowly leak urine instead of a gush.  Pelvic floor strength 4-/5. Patient has met all of her STG's.  Patient will benefit from physical therapy to improve pelvic floor strength and reduce urinary leakage.    Pt will benefit from skilled therapeutic intervention in order to improve on the following deficits Decreased strength;Decreased endurance   Rehab Potential Good   Clinical Impairments Affecting Rehab Potential None   PT Frequency 1x / week   PT Duration 8 weeks   PT Treatment/Interventions ADLs/Self Care Home Management;Biofeedback;Electrical Stimulation;Therapeutic exercise;Therapeutic activities;Neuromuscular re-education;Patient/family education;Manual techniques   PT Next Visit Plan pelvic floor exercises, Pelvic floor EMG,,   PT Home Exercise Plan Progress as needed   Consulted and Agree with Plan of Care Patient        Problem List Patient Active Problem List   Diagnosis Date Noted  . Incontinence in female 02/06/2015  . History of chicken pox   . Hyperlipidemia, mixed 05/24/2014  . Osteoarthritis 05/29/2013  . IgG monoclonal protein  disorder 05/29/2013  . Tobacco use disorder  Quit smoking 35  years ago  11/16/2012  . Calcified granuloma of lung ([place]) 11/16/2012  . Sinus bradycardia on ECG 11/16/2012  . TMJ arthralgia 03/10/2012  . Hypothyroidism 10/07/2011  . Osteopenia  Dexa done 05/27/2014 normal  10/07/2011  . Diverticulosis of colon without hemorrhage 10/07/2011  . Vitamin D deficiency 10/07/2011  . GERD (gastroesophageal reflux disease) 10/07/2011  . Hearing loss 10/07/2011  . DJD (degenerative joint disease) 10/07/2011  . History of depression 10/07/2011  . Disc herniation 10/07/2011  . H/O hysterectomy with oophorectomy 10/07/2011  . Family history of breast cancer in first degree relative 10/07/2011  . Thyroid cyst 10/07/2011   Earlie Counts, PT 04/05/2015 12:24 PM   [facility] [address] [place], [place], [address] Phone: [phone]   Fax:  [phone]  Name: Kylie Walters MRN: [id number] Date of Birth: [birth year]

## 2015-04-21 ENCOUNTER — Other Ambulatory Visit: Payer: Self-pay | Admitting: Family Medicine

## 2015-04-21 NOTE — Telephone Encounter (Signed)
Refilled synthroid #[id number] with 0 .Pt has apt feb 17 ,2017

## 2015-04-27 ENCOUNTER — Encounter: Payer: Self-pay | Admitting: Physical Therapy

## 2015-04-27 ENCOUNTER — Ambulatory Visit: Payer: Medicare Other | Attending: Family Medicine | Admitting: Physical Therapy

## 2015-04-27 DIAGNOSIS — N8189 Other female genital prolapse: Secondary | ICD-10-CM

## 2015-04-27 DIAGNOSIS — N8184 Pelvic muscle wasting: Secondary | ICD-10-CM | POA: Diagnosis not present

## 2015-04-27 DIAGNOSIS — M6289 Other specified disorders of muscle: Secondary | ICD-10-CM

## 2015-04-27 DIAGNOSIS — N907 Vulvar cyst: Secondary | ICD-10-CM | POA: Diagnosis not present

## 2015-04-27 NOTE — Patient Instructions (Addendum)
Lower abdominal/core stability exercises  1. Practice your breathing technique: Inhale through your nose expanding your belly and rib cage. Try not to breathe into your chest. Exhale slowly and gradually out your mouth feeling a sense of softness to your body. Practice multiple times. This can be performed unlimited.  2. Finding the lower abdominals. Laying on your back with the knees bent, place your fingers just below your belly button. Using your breathing technique from above, on your exhale gently pull the belly button away from your fingertips without tensing any other muscles. Practice this 5x. Next, as you exhale, draw belly button inwards and hold onto it...then feel as if you are pulling that muscle across your pelvis like you are tightening a belt. This can be hard to do at first so be patient and practice. Do 5-10 reps 1-3 x day. Always recognize quality over quantity; if your abdominal muscles become tired you will notice you may tighten/contract other muscles. This is the time to take a break.   Practice this first laying on your back, then in sitting, progressing to standing and finally adding it to all your daily movements.   3. Finding your pelvic floor. Using the breathing technique above, when your exhale, this time draw your pelvic floor muscles up as if you were attempting to stop the flow of urination. Be careful NOT to tense any other muscles. This can be hard, BE PATIENT. Try to hold up to 10 seconds repeating 10x. Try 2x a day. Once you feel you are doing this well, add this contraction to exercise #2. First contracting your pelvic floor followed by lower abdominals.   4. Adding leg movements. Add the following leg movements to challenge your ability to keep your core stable:  1. Single leg drop outs: Laying on your back with knees bent feet flat. Inhale,  dropping one knee outward KEEPING YOUR PELVIS STILL. Exhale as you bring the leg back, simultaneously performing your lower  abdominal contraction. Do 5-10 on each leg.   2. Marching: ( replace bicycle pedal) While keeping your pelvis still, lift the right foot a few inches, put it down then lift left foot. This will mimic a march. Start slow to establish control. Once you have control you may speed it up. Do 10-20x. You MUST keep your lower abdominlas contracted while you march. Breathe naturally    3. Single leg slides:  ( replace leg lifts) Inhale while you slowly slide one leg out keeping your pelvis still. Only slide your leg as far as you can keep your pelvis still. Exhale as you bring the leg back to the start, contracting the lower abdominals as you do that. Keep your upper body relaxed. Do 5-10 on each side.    Diagonal punches.  ( part of your exercise card) Breath out as you punch across your body while pulling up the pelvic floor.   While bringing arm back breath in. Tighten buttocks as you perform the exercise.   Knee Extension: Sit to Stand (Eccentric) Replace air squats    Stand close to chair. Slowly lower self to seated position. 30___ reps per set, _1__ sets per day, _1__ days per week. As you stand tighten the pelvic floor.  Copyright  VHI. All rights reserved.   [facility] [address] [place], [place] [address] Phone # [phone] Fax [phone]

## 2015-04-27 NOTE — Therapy (Signed)
[facility] [address] [place], [place], [address] Phone: [phone]   Fax:  [phone]  Physical Therapy Treatment  Patient Details  Name: Kylie Walters MRN: [id number] Date of Birth: 1924/07/29 Referring Provider: Dr. Penni Homans  Encounter Date: 04/27/2015      PT End of Session - 04/27/15 1108    Visit Number 5   Number of Visits 10  Medicare   Date for PT Re-Evaluation 06/12/15   PT Start Time 1105   PT Stop Time 1143   PT Time Calculation (min) 38 min   Activity Tolerance Patient limited by pain   Behavior During Therapy [facility] for tasks assessed/performed      Past Medical History  Diagnosis Date  . Hypothyroidism   . Osteopenia   . Diverticulosis   . Granuloma of skin     RLL  . Vitamin D deficiency   . GERD (gastroesophageal reflux disease)   . Hyperlipidemia   . Hearing loss   . Arthritis   . Depression 4/03  . Lumbar herniated disc   . Hyperlipidemia, mixed 05/24/2014  . Diverticulosis of colon without hemorrhage 10/07/2011  . History of chicken pox   . H/O measles   . H/O mumps   . Incontinence in female 02/06/2015    Past Surgical History  Procedure Laterality Date  . Tonsillectomy and adenoidectomy  1939  . Dilation and curettage of uterus  1954, 1978, 1979  . Abdominal hysterectomy  1980  . Knee surgery  2004    left knee  . Cataract extraction  2010  . Breast surgery      L breast cyst  . Appendectomy      There were no vitals filed for this visit.  Visit Diagnosis:  Pelvic floor dysfunction  Perineal floor weakness      Subjective Assessment - 04/27/15 1138    Subjective I am doing an  exercise challenge and not sure it is good for me.  I have good and bad days.  I have trouble coordinating the breathing and pelvic floor exercise. I am using less pads and they are dryer.   Patient Stated Goals reduce leakage   Currently in Pain? No/denies                          [facility] Adult PT Treatment/Exercise - 04/27/15 0001    Lumbar Exercises: Supine   Ab Set 4 seconds;10 reps                PT Education - 04/27/15 1137    Education provided Yes   Education Details pelvic floor exercises with abdominal exercise and sit to stand   Person(s) Educated Patient   Methods Explanation;Demonstration;Tactile cues;Verbal cues;Handout   Comprehension Returned demonstration;Verbalized understanding          PT Short Term Goals - 04/05/15 1147    PT SHORT TERM GOAL #1   Title understand what bladder irritants are and how they increase urinary leakage   Time 4   Period Weeks   Status Achieved   PT SHORT TERM GOAL #2   Title pads are 25% dryer due to increased pelvic floor strength   Time 4   Period Weeks   Status Achieved   PT SHORT TERM GOAL #3   Title understand what timed voiding and able to void every 2 hours   Time 4   Period Weeks   Status Achieved  PT Long Term Goals - 04/27/15 1139    PT LONG TERM GOAL #1   Title Independent with HEP for pelvic floor strengthening   Time 8   Period Weeks   Status On-going  learning exercises   PT LONG TERM GOAL #2   Title pads are 50% dryer due to increased pelvic floor strength   Time 8   Period Weeks   Status On-going  25% dryer   PT LONG TERM GOAL #3   Title pelvic floor strength is 4/5 holding 10 sec.    Time 8   Period Weeks   Status On-going   PT LONG TERM GOAL #4   Title wear 1-2 pads per day due to increased pelvic floor strength   Time 8   Period Weeks   Status On-going  2-3 pads               Plan - 04/27/15 1142    Clinical Impression Statement Patient is a 82 year old female with diagnosis of incontinence in female.  Patient is using 3-4 pads now and pads are 25% dryer.  Patient has learned how to exercise in her class while contracting the pelvic lfoor and modifing them.  Patient will benefit from physical therapy  to redue urinary leakage.    Pt will benefit from skilled therapeutic intervention in order to improve on the following deficits Decreased strength;Decreased endurance   Rehab Potential Good   Clinical Impairments Affecting Rehab Potential None   PT Frequency 1x / week   PT Duration 8 weeks   PT Treatment/Interventions ADLs/Self Care Home Management;Biofeedback;Electrical Stimulation;Therapeutic exercise;Therapeutic activities;Neuromuscular re-education;Patient/family education;Manual techniques   PT Next Visit Plan pelvic floor exercises, Pelvic floor EMG, test pelvic floor strength   PT Home Exercise Plan Progress as needed   Consulted and Agree with Plan of Care Patient        Problem List Patient Active Problem List   Diagnosis Date Noted  . Incontinence in female 02/06/2015  . History of chicken pox   . Hyperlipidemia, mixed 05/24/2014  . Osteoarthritis 05/29/2013  . 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 Tobacco use disorder  Quit smoking 35 years ago  11/16/2012  . Calcified granuloma of lung ([place]) 11/16/2012  . Sinus bradycardia on ECG 11/16/2012  . TMJ arthralgia 03/10/2012  . Hypothyroidism 10/07/2011  . Osteopenia  Dexa done 05/27/2014 normal  10/07/2011  . Diverticulosis of colon without hemorrhage 10/07/2011  . Vitamin D deficiency 10/07/2011  . GERD (gastroesophageal reflux disease) 10/07/2011  . Hearing loss 10/07/2011  . DJD (degenerative joint disease) 10/07/2011  . History of depression 10/07/2011  . Disc herniation 10/07/2011  . H/O hysterectomy with oophorectomy 10/07/2011  . Family history of breast cancer in first degree relative 10/07/2011  . Thyroid cyst 10/07/2011    Earlie Counts, PT 04/27/2015 11:44 AM   [facility] [address], [place], [place], [address] Phone: [phone]   Fax:  [phone]  Name: MADIHA BAMBRICK MRN: [id number] Date of Birth: 01/01/1925

## 2015-05-09 ENCOUNTER — Ambulatory Visit (INDEPENDENT_AMBULATORY_CARE_PROVIDER_SITE_OTHER): Payer: Medicare Other

## 2015-05-09 VITALS — BP 136/60 | HR 55 | Ht 65.0 in | Wt 152.2 lb

## 2015-05-09 DIAGNOSIS — Z0000 Encounter for general adult medical examination without abnormal findings: Secondary | ICD-10-CM

## 2015-05-09 NOTE — Progress Notes (Signed)
Subjective:   Kylie Walters is a 82 y.o. female who presents for Medicare Annual (Subsequent) preventive examination.  Review of Systems: No ROS  Cardiac Risk Factors include: advanced age (>57mn, >>57women);hypertension (hyperlipidemia)   Sleep patterns:   Sleeps for 2 hours and wakes for 2 hours and then repeats/gets up 2 times during the night to void.   Home Safety/Smoke Alarms: Feels safe at home. [place]  Smoke alarms present.   Firearm Safety:  No firearms Seat Belt Safety/Bike Helmet:  Always wears seat belt.    Counseling:   Eye Exam- 05/2014 Female:  Pap-Hysterectomy      Mammo-06/27/14-benign      Dexa scan-05/27/14-Low Bone Mass  CCS-"years ago"    Objective:     Vitals: BP 136/60 mmHg  Pulse 55  Ht '5\' 5"'$  (1.651 m)  Wt 152 lb 3.2 oz (69.037 kg)  BMI 25.33 kg/m2  SpO2 97%  Tobacco History  Smoking status  . Former Smoker -- 0.50 packs/day for 40 years  . Types: Cigarettes  . Start date: 06/09/1940  . Quit date: 04/23/1979  Smokeless tobacco  . Never Used    Comment: quit smoking 40 years ago     Counseling given: Not Answered   Past Medical History  Diagnosis Date  . Hypothyroidism   . Osteopenia   . Diverticulosis   . Granuloma of skin     RLL  . Vitamin D deficiency   . GERD (gastroesophageal reflux disease)   . Hyperlipidemia   . Hearing loss   . Arthritis   . Depression 4/03  . Lumbar herniated disc   . Hyperlipidemia, mixed 05/24/2014  . Diverticulosis of colon without hemorrhage 10/07/2011  . History of chicken pox   . H/O measles   . H/O mumps   . Incontinence in female 02/06/2015   Past Surgical History  Procedure Laterality Date  . Tonsillectomy and adenoidectomy  1939  . Dilation and curettage of uterus  1954, 1978, 1979  . Abdominal hysterectomy  1980  . Knee surgery  2004    left knee  . Cataract extraction  2010  . Breast surgery      L breast cyst  . Appendectomy     Family History  Problem  Relation Age of Onset  . Breast cancer Mother     metastatic disease  . Kidney disease Father     Bright's disease  . Cancer Maternal Grandfather     prostate  . Obesity Daughter   . Thyroid disease Son   . Hyperlipidemia Son    History  Sexual Activity  . Sexual Activity: Not Currently  . Birth Control/ Protection: Post-menopausal    Comment: [place]. no dietary restrictions    Outpatient Encounter Prescriptions as of 05/09/2015  Medication Sig  . calcium-vitamin D (OSCAL WITH D) 500-200 MG-UNIT per tablet Take 1 tablet by mouth.  . levothyroxine (SYNTHROID, LEVOTHROID) 88 MCG tablet TAKE 1 TABLET BY MOUTH DAILY BEFORE BREAKFAST MONDAY THRU FRIDAY.  TAKE 1/2 TABLET SATURDAY AND SUNDAY  . Naproxen Sodium (ALEVE PO) Take 1 tablet by mouth daily as needed.  . psyllium (METAMUCIL) 58.6 % packet Take 1 packet by mouth daily.   No facility-administered encounter medications on file as of 05/09/2015.    Activities of Daily Living In your present state of health, do you have any difficulty performing the following activities: 05/09/2015 02/06/2015  Hearing? YTempie Donning Vision? N N  Difficulty concentrating or making decisions? Tempie Donning  Walking or climbing stairs? N N  Dressing or bathing? N N  Doing errands, shopping? N N  Preparing Food and eating ? N -  Using the Toilet? N -  In the past six months, have you accidently leaked urine? Y -  Do you have problems with loss of bowel control? N -  Managing your Medications? N -  Managing your Finances? N -  Housekeeping or managing your Housekeeping? N -    Patient Care Team: Mosie Lukes, MD as PCP - General (Family Medicine)    Assessment:  Assessment deferred to PCP during CPE.    Exercise Activities and Dietary recommendations Current Exercise Habits:: Structured exercise class, Type of exercise: Other - see comments;strength training/weights;stretching;walking (Swimming), Time (Minutes): 45, Frequency  (Times/Week): 5, Weekly Exercise (Minutes/Week): 225   Diet:  Chef prepares food.  Eats 3 meals per day.   Goals    . Remain active and independent.       Continue to eat healthy and exercise.        Fall Risk Fall Risk  05/09/2015 08/08/2014 06/09/2013 11/16/2012  Falls in the past year? No Yes Yes No  Number falls in past yr: - 1 1 -  Injury with Fall? - Yes No -  Risk for fall due to : History of fall(s);Impaired balance/gait - Impaired balance/gait -   Depression Screen PHQ 2/9 Scores 05/09/2015 08/08/2014 06/09/2013 11/16/2012  PHQ - 2 Score 0 0 0 0     Cognitive Testing MMSE - Mini Mental State Exam 05/09/2015  Orientation to time 5  Orientation to Place 5  Registration 3  Attention/ Calculation 5  Recall 3  Language- name 2 objects 2  Language- repeat 1  Language- follow 3 step command 3  Language- read & follow direction 1  Write a sentence 1  Copy design 1  Total score 30    Immunization History  Administered Date(s) Administered  . Influenza Split 02/01/2011, 01/13/2012  . Influenza,inj,Quad PF,36+ Mos 01/17/2014  . Influenza-Unspecified 01/13/2015  . PPD Test 11/15/2014  . Pneumococcal Conjugate-13 04/22/2002, 07/22/2010, 05/18/2014  . Td 06/20/2008   Screening Tests Health Maintenance  Topic Date Due  . PNA vac Low Risk Adult (2 of 2 - PPSV23) 05/19/2015  . MAMMOGRAM  06/27/2015  . INFLUENZA VACCINE  11/21/2015  . TETANUS/TDAP  06/21/2018  . DEXA SCAN  Completed  . ZOSTAVAX  Addressed      Plan:  Bring in a copy of advanced directive to next visit.    Eat heart healthy diet, increase physical activity, avoid trans fats, consider a krill oil cap daily.  Continue to take calcium and vitamin d supplements daily to strengthen your bones.  Continue doing brain stimulating activities (puzzles, reading, adult coloring books, staying active) to keep memory sharp.   Follow up with Dr. Charlett Blake as scheduled.     During the course of the visit the patient  was educated and counseled about the following appropriate screening and preventive services:   Vaccines to include Pneumoccal, Influenza, Hepatitis B, Td, Zostavax, HCV  Electrocardiogram  Cardiovascular Disease  Colorectal cancer screening  Bone density screening  Diabetes screening  Glaucoma screening  Mammography/PAP  Nutrition counseling   Patient Instructions (the written plan) was given to the patient.   Rudene Anda, RN  05/09/2015

## 2015-05-09 NOTE — Progress Notes (Signed)
Pre visit review using our clinic review tool, if applicable. No additional management support is needed unless otherwise documented below in the visit note. 

## 2015-05-09 NOTE — Patient Instructions (Addendum)
Bring in a copy of advanced directive to next visit.    Eat heart healthy diet, increase physical activity, avoid trans fats, consider a krill oil cap daily.  Continue to take calcium and vitamin d supplements daily to strengthen your bones.  Continue doing brain stimulating activities (puzzles, reading, adult coloring books, staying active) to keep memory sharp.   Follow up with Dr. Charlett Blake as scheduled.     Fat and Cholesterol Restricted Diet Getting too much fat and cholesterol in your diet may cause health problems. Following this diet helps keep your fat and cholesterol at normal levels. This can keep you from getting sick. WHAT TYPES OF FAT SHOULD I CHOOSE?  Choose monosaturated and polyunsaturated fats. These are found in foods such as olive oil, canola oil, flaxseeds, walnuts, almonds, and seeds.  Eat more omega-3 fats. Good choices include salmon, mackerel, sardines, tuna, flaxseed oil, and ground flaxseeds.  Limit saturated fats. These are in animal products such as meats, butter, and cream. They can also be in plant products such as palm oil, palm kernel oil, and coconut oil.   Avoid foods with partially hydrogenated oils in them. These contain trans fats. Examples of foods that have trans fats are stick margarine, some tub margarines, cookies, crackers, and other baked goods. WHAT GENERAL GUIDELINES DO I NEED TO FOLLOW?   Check food labels. Look for the words "trans fat" and "saturated fat."  When preparing a meal:  Fill half of your plate with vegetables and green salads.  Fill one fourth of your plate with whole grains. Look for the word "whole" as the first word in the ingredient list.  Fill one fourth of your plate with lean protein foods.  Limit fruit to two servings a day. Choose fruit instead of juice.  Eat more foods with soluble fiber. Examples of foods with this type of fiber are apples, broccoli, carrots, beans, peas, and barley. Try to get 20-30 g (grams) of  fiber per day.  Eat more home-cooked foods. Eat less at restaurants and buffets.  Limit or avoid alcohol.  Limit foods high in starch and sugar.  Limit fried foods.  Cook foods without frying them. Baking, boiling, grilling, and broiling are all great options.  Lose weight if you are overweight. Losing even a small amount of weight can help your overall health. It can also help prevent diseases such as diabetes and heart disease. WHAT FOODS CAN I EAT? Grains Whole grains, such as whole wheat or whole grain breads, crackers, cereals, and pasta. Unsweetened oatmeal, bulgur, barley, quinoa, or brown rice. Corn or whole wheat flour tortillas. Vegetables Fresh or frozen vegetables (raw, steamed, roasted, or grilled). Green salads. Fruits All fresh, canned (in natural juice), or frozen fruits. Meat and Other Protein Products Ground beef (85% or leaner), grass-fed beef, or beef trimmed of fat. Skinless chicken or [place]. Ground chicken or [place]. Pork trimmed of fat. All fish and seafood. Eggs. Dried beans, peas, or lentils. Unsalted nuts or seeds. Unsalted canned or dry beans. Dairy Low-fat dairy products, such as skim or 1% milk, 2% or reduced-fat cheeses, low-fat ricotta or cottage cheese, or plain low-fat yogurt. Fats and Oils Tub margarines without trans fats. Light or reduced-fat mayonnaise and salad dressings. Avocado. Olive, canola, sesame, or safflower oils. Natural peanut or almond butter (choose ones without added sugar and oil). The items listed above may not be a complete list of recommended foods or beverages. Contact your dietitian for more options. WHAT FOODS ARE NOT  RECOMMENDED? Grains White bread. White pasta. White rice. Cornbread. Bagels, pastries, and croissants. Crackers that contain trans fat. Vegetables White potatoes. Corn. Creamed or fried vegetables. Vegetables in a cheese sauce. Fruits Dried fruits. Canned fruit in light or heavy syrup. Fruit juice. Meat and  Other Protein Products Fatty cuts of meat. Ribs, chicken wings, bacon, sausage, bologna, salami, chitterlings, fatback, hot dogs, bratwurst, and packaged luncheon meats. Liver and organ meats. Dairy Whole or 2% milk, cream, half-and-half, and cream cheese. Whole milk cheeses. Whole-fat or sweetened yogurt. Full-fat cheeses. Nondairy creamers and whipped toppings. Processed cheese, cheese spreads, or cheese curds. Sweets and Desserts Corn syrup, sugars, honey, and molasses. Candy. Jam and jelly. Syrup. Sweetened cereals. Cookies, pies, cakes, donuts, muffins, and ice cream. Fats and Oils Butter, stick margarine, lard, shortening, ghee, or bacon fat. Coconut, palm kernel, or palm oils. Beverages Alcohol. Sweetened drinks (such as sodas, lemonade, and fruit drinks or punches). The items listed above may not be a complete list of foods and beverages to avoid. Contact your dietitian for more information.   This information is not intended to replace advice given to you by your health care provider. Make sure you discuss any questions you have with your health care provider.   Document Released: 10/08/2011 Document Revised: 04/29/2014 Document Reviewed: 07/08/2013 Elsevier Interactive Patient Education 2016 [place] in the Home  Falls can cause injuries. They can happen to people of all ages. There are many things you can do to make your home safe and to help prevent falls.  WHAT CAN I DO ON THE OUTSIDE OF MY HOME?  Regularly fix the edges of walkways and driveways and fix any cracks.  Remove anything that might make you trip as you walk through a door, such as a raised step or threshold.  Trim any bushes or trees on the path to your home.  Use bright outdoor lighting.  Clear any walking paths of anything that might make someone trip, such as rocks or tools.  Regularly check to see if handrails are loose or broken. Make sure that both sides of any steps have  handrails.  Any raised decks and porches should have guardrails on the edges.  Have any leaves, snow, or ice cleared regularly.  Use sand or salt on walking paths during winter.  Clean up any spills in your garage right away. This includes oil or grease spills. WHAT CAN I DO IN THE BATHROOM?   Use night lights.  Install grab bars by the toilet and in the tub and shower. Do not use towel bars as grab bars.  Use non-skid mats or decals in the tub or shower.  If you need to sit down in the shower, use a plastic, non-slip stool.  Keep the floor dry. Clean up any water that spills on the floor as soon as it happens.  Remove soap buildup in the tub or shower regularly.  Attach bath mats securely with double-sided non-slip rug tape.  Do not have throw rugs and other things on the floor that can make you trip. WHAT CAN I DO IN THE BEDROOM?  Use night lights.  Make sure that you have a light by your bed that is easy to reach.  Do not use any sheets or blankets that are too big for your bed. They should not hang down onto the floor.  Have a firm chair that has side arms. You can use this for support while you get dressed.  Do  not have throw rugs and other things on the floor that can make you trip. WHAT CAN I DO IN THE KITCHEN?  Clean up any spills right away.  Avoid walking on wet floors.  Keep items that you use a lot in easy-to-reach places.  If you need to reach something above you, use a strong step stool that has a grab bar.  Keep electrical cords out of the way.  Do not use floor polish or wax that makes floors slippery. If you must use wax, use non-skid floor wax.  Do not have throw rugs and other things on the floor that can make you trip. WHAT CAN I DO WITH MY STAIRS?  Do not leave any items on the stairs.  Make sure that there are handrails on both sides of the stairs and use them. Fix handrails that are broken or loose. Make sure that handrails are as long as  the stairways.  Check any carpeting to make sure that it is firmly attached to the stairs. Fix any carpet that is loose or worn.  Avoid having throw rugs at the top or bottom of the stairs. If you do have throw rugs, attach them to the floor with carpet tape.  Make sure that you have a light switch at the top of the stairs and the bottom of the stairs. If you do not have them, ask someone to add them for you. WHAT ELSE CAN I DO TO HELP PREVENT FALLS?  Wear shoes that:  Do not have high heels.  Have rubber bottoms.  Are comfortable and fit you well.  Are closed at the toe. Do not wear sandals.  If you use a stepladder:  Make sure that it is fully opened. Do not climb a closed stepladder.  Make sure that both sides of the stepladder are locked into place.  Ask someone to hold it for you, if possible.  Clearly mark and make sure that you can see:  Any grab bars or handrails.  First and last steps.  Where the edge of each step is.  Use tools that help you move around (mobility aids) if they are needed. These include:  Canes.  Walkers.  Scooters.  Crutches.  Turn on the lights when you go into a dark area. Replace any light bulbs as soon as they burn out.  Set up your furniture so you have a clear path. Avoid moving your furniture around.  If any of your floors are uneven, fix them.  If there are any pets around you, be aware of where they are.  Review your medicines with your doctor. Some medicines can make you feel dizzy. This can increase your chance of falling. Ask your doctor what other things that you can do to help prevent falls.   This information is not intended to replace advice given to you by your health care provider. Make sure you discuss any questions you have with your health care provider.   Document Released: 02/02/2009 Document Revised: 08/23/2014 Document Reviewed: 05/13/2014 Elsevier Interactive Patient Education [employer].

## 2015-05-10 ENCOUNTER — Ambulatory Visit: Payer: Medicare Other | Admitting: Physical Therapy

## 2015-05-10 ENCOUNTER — Encounter: Payer: Self-pay | Admitting: Physical Therapy

## 2015-05-10 DIAGNOSIS — N8184 Pelvic muscle wasting: Secondary | ICD-10-CM | POA: Diagnosis not present

## 2015-05-10 DIAGNOSIS — N8189 Other female genital prolapse: Secondary | ICD-10-CM

## 2015-05-10 DIAGNOSIS — M6289 Other specified disorders of muscle: Secondary | ICD-10-CM

## 2015-05-10 DIAGNOSIS — N907 Vulvar cyst: Secondary | ICD-10-CM | POA: Diagnosis not present

## 2015-05-10 NOTE — Therapy (Signed)
[facility] [address] [place], [place], [address] Phone: [phone]   Fax:  [phone]  Physical Therapy Treatment  Patient Details  Name: Kylie Walters MRN: [id number] Date of Birth: 09-Dec-1924 Referring Provider: Dr. Penni Homans  Encounter Date: 05/10/2015      PT End of Session - 05/10/15 1452    Visit Number 6   Number of Visits 10  Medicare   PT Start Time 1450   PT Stop Time 1528   PT Time Calculation (min) 38 min   Activity Tolerance Patient limited by pain   Behavior During Therapy [facility] for tasks assessed/performed      Past Medical History  Diagnosis Date  . Hypothyroidism   . Osteopenia   . Diverticulosis   . Granuloma of skin     RLL  . Vitamin D deficiency   . GERD (gastroesophageal reflux disease)   . Hyperlipidemia   . Hearing loss   . Arthritis   . Depression 4/03  . Lumbar herniated disc   . Hyperlipidemia, mixed 05/24/2014  . Diverticulosis of colon without hemorrhage 10/07/2011  . History of chicken pox   . H/O measles   . H/O mumps   . Incontinence in female 02/06/2015    Past Surgical History  Procedure Laterality Date  . Tonsillectomy and adenoidectomy  1939  . Dilation and curettage of uterus  1954, 1978, 1979  . Abdominal hysterectomy  1980  . Knee surgery  2004    left knee  . Cataract extraction  2010  . Breast surgery      L breast cyst  . Appendectomy      There were no vitals filed for this visit.  Visit Diagnosis:  Pelvic floor dysfunction  Perineal floor weakness      Subjective Assessment - 05/10/15 1453    Subjective I have good and bad days. I am able to wear one pad instead of 2.  When I go out I wear 2 pads for precautions but did not need the second pad. I am able to sense before the leak starts.    Patient Stated Goals reduce leakage   Currently in Pain? No/denies            [facility] PT Assessment - 05/10/15 0001    Assessment   Medical  Diagnosis N39.3 Incontinence in female   Prior Therapy yes, biofeedback   Precautions   Precautions None   Balance Screen   Has the patient fallen in the past 6 months No   Has the patient had a decrease in activity level because of a fear of falling?  No   Is the patient reluctant to leave their home because of a fear of falling?  No   Prior Function   Level of Independence Independent   Vocation Retired   Leisure pool exercise   Cognition   Overall Cognitive Status Within Functional Limits for tasks assessed  trouble hearing   Observation/Other Assessments   Focus on Therapeutic Outcomes (FOTO)  12% limitation  goal is 30% limitation CJ   ROM / Strength   AROM / PROM / Strength Strength;AROM   AROM   Lumbar Extension decreased by 25%   Strength   Overall Strength Comments bil. hip rotators 5/5 and bil. hip abd 5/5                  Pelvic Floor Special Questions - 05/10/15 0001    Pelvic Floor Internal  Exam Patient confirmed identification and approved physical therapis to assess pelvic floor muscle strength and integrity   Exam Type Vaginal   Strength --  4-/5   Strength # of seconds 10           [facility] Adult PT Treatment/Exercise - 05/10/15 0001    Lumbar Exercises: Supine   Ab Set 4 seconds;10 reps   Clam 20 reps;1 second  abdominal contraction   Heel Slides 20 reps  abdominal contraction   Bent Knee Raise 20 reps  abdominal bracing                PT Education - 05/10/15 1516    Education provided Yes   Education Details reviewed HEP and patient is able to return demonstration correctly   Person(s) Educated Patient   Methods Explanation;Demonstration   Comprehension Verbalized understanding;Returned demonstration          PT Short Term Goals - 04/05/15 1147    PT SHORT TERM GOAL #1   Title understand what bladder irritants are and how they increase urinary leakage   Time 4   Period Weeks   Status Achieved   PT SHORT TERM GOAL #2    Title pads are 25% dryer due to increased pelvic floor strength   Time 4   Period Weeks   Status Achieved   PT SHORT TERM GOAL #3   Title understand what timed voiding and able to void every 2 hours   Time 4   Period Weeks   Status Achieved           PT Long Term Goals - 05/10/15 1456    PT LONG TERM GOAL #1   Title Independent with HEP for pelvic floor strengthening   Time 8   Period Weeks   Status Achieved   PT LONG TERM GOAL #2   Title pads are 50% dryer due to increased pelvic floor strength   Time 8   Period Weeks   Status Achieved   PT LONG TERM GOAL #3   Title pelvic floor strength is 4/5 holding 10 sec.    Time 8   Period Weeks   Status Partially Met  4-/5 hold 10 sec   PT LONG TERM GOAL #4   Title wear 1-2 pads per day due to increased pelvic floor strength   Time 8   Period Weeks   Status Achieved               Plan - 05/10/15 1508    Clinical Impression Statement Patient is a 82 year old female with diagnosis of incontinence in female.  FOTO score is 12% limitatiaon. Patient is using 1-2 pads per day.  When she uses 2 pads is for going out and just incase.  Patient has mett all STG's and LTG with exception of  strength 4-/5.  Paitent reports her pads are 505 decrease in leakage.  Patient urinates every 1 hour.  Pateint would likd to be discharged and do her exercises at home. Bilateral hip strength is 5/5. Patient is independent with HEP and understand how to progress herself.     Pt will benefit from skilled therapeutic intervention in order to improve on the following deficits Decreased strength;Decreased endurance   Rehab Potential Good   Clinical Impairments Affecting Rehab Potential None   PT Treatment/Interventions ADLs/Self Care Home Management;Biofeedback;Electrical Stimulation;Therapeutic exercise;Therapeutic activities;Neuromuscular re-education;Patient/family education;Manual techniques   PT Next Visit Plan Discharge to HEP   PT Home  Exercise Plan Current  HEP   Consulted and Agree with Plan of Care Patient          G-Codes - 05/25/15 1525    Functional Assessment Tool Used FOTO score is 12% limitation   Functional Limitation Other PT primary   Other PT Primary Goal Status (Q8250) At least 20 percent but less than 40 percent impaired, limited or restricted   Other PT Primary Discharge Status (I3704) At least 1 percent but less than 20 percent impaired, limited or restricted      Problem List Patient Active Problem List   Diagnosis Date Noted  . Incontinence in female 02/06/2015  . History of chicken pox   . Hyperlipidemia, mixed 05/24/2014  . Osteoarthritis 05/29/2013  . 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 Tobacco use disorder  Quit smoking 35 years ago  11/16/2012  . Calcified granuloma of lung ([place]) 11/16/2012  . Sinus bradycardia on ECG 11/16/2012  . TMJ arthralgia 03/10/2012  . Hypothyroidism 10/07/2011  . Osteopenia  Dexa done 05/27/2014 normal  10/07/2011  . Diverticulosis of colon without hemorrhage 10/07/2011  . Vitamin D deficiency 10/07/2011  . GERD (gastroesophageal reflux disease) 10/07/2011  . Hearing loss 10/07/2011  . DJD (degenerative joint disease) 10/07/2011  . History of depression 10/07/2011  . Disc herniation 10/07/2011  . H/O hysterectomy with oophorectomy 10/07/2011  . Family history of breast cancer in first degree relative 10/07/2011  . Thyroid cyst 10/07/2011    Earlie Counts, PT 05-25-15 3:30 PM   [facility] [address] [place], [place], [address] Phone: [phone]   Fax:  [phone]  Name: LURA FALOR MRN: [id number] Date of Birth: Aug 23, 1924  PHYSICAL THERAPY DISCHARGE SUMMARY  Visits from Start of Care: 6 Current functional level related to goals / functional outcomes: See above   Remaining deficits: See above   Education / Equipment: HEP  Plan: Patient agrees to discharge.   Patient goals were met. Patient is being discharged due to meeting the stated rehab goals. Thank you for the referral. Earlie Counts, PT 25-May-2015 3:30 PM   ?????

## 2015-05-15 ENCOUNTER — Encounter: Payer: Medicare Other | Admitting: Internal Medicine

## 2015-05-18 ENCOUNTER — Telehealth: Payer: Self-pay | Admitting: Family Medicine

## 2015-05-18 NOTE — Telephone Encounter (Signed)
Pt dropped off letter from [employer]) indicating that pt was unfavorable to be seen at our office as new patient, but also brought in statement indicating visit was already paid by insurance. Pt was not sure what letter meant and what was it for. Document given to [facility] for evaluation.

## 2015-05-25 ENCOUNTER — Other Ambulatory Visit: Payer: Self-pay | Admitting: Family Medicine

## 2015-05-25 DIAGNOSIS — Z1231 Encounter for screening mammogram for malignant neoplasm of breast: Secondary | ICD-10-CM

## 2015-06-08 DIAGNOSIS — H43393 Other vitreous opacities, bilateral: Secondary | ICD-10-CM | POA: Diagnosis not present

## 2015-06-08 DIAGNOSIS — H04123 Dry eye syndrome of bilateral lacrimal glands: Secondary | ICD-10-CM | POA: Diagnosis not present

## 2015-06-09 ENCOUNTER — Ambulatory Visit (HOSPITAL_BASED_OUTPATIENT_CLINIC_OR_DEPARTMENT_OTHER)
Admission: RE | Admit: 2015-06-09 | Discharge: 2015-06-09 | Disposition: A | Discharge status: Home / Self Care | Payer: Medicare Other | Source: Ambulatory Visit | Attending: Family Medicine | Admitting: Family Medicine

## 2015-06-09 ENCOUNTER — Ambulatory Visit (INDEPENDENT_AMBULATORY_CARE_PROVIDER_SITE_OTHER): Payer: Medicare Other | Admitting: Family Medicine

## 2015-06-09 ENCOUNTER — Encounter: Payer: Self-pay | Admitting: Family Medicine

## 2015-06-09 VITALS — BP 140/70 | HR 48 | Temp 97.6°F | Ht 65.0 in | Wt 155.1 lb

## 2015-06-09 DIAGNOSIS — G5603 Carpal tunnel syndrome, bilateral upper limbs: Secondary | ICD-10-CM

## 2015-06-09 DIAGNOSIS — R32 Unspecified urinary incontinence: Secondary | ICD-10-CM

## 2015-06-09 DIAGNOSIS — I25810 Atherosclerosis of coronary artery bypass graft(s) without angina pectoris: Secondary | ICD-10-CM

## 2015-06-09 DIAGNOSIS — E039 Hypothyroidism, unspecified: Secondary | ICD-10-CM

## 2015-06-09 DIAGNOSIS — K219 Gastro-esophageal reflux disease without esophagitis: Secondary | ICD-10-CM

## 2015-06-09 DIAGNOSIS — Z87891 Personal history of nicotine dependence: Secondary | ICD-10-CM | POA: Insufficient documentation

## 2015-06-09 DIAGNOSIS — I517 Cardiomegaly: Secondary | ICD-10-CM | POA: Insufficient documentation

## 2015-06-09 DIAGNOSIS — R351 Nocturia: Secondary | ICD-10-CM

## 2015-06-09 DIAGNOSIS — N393 Stress incontinence (female) (male): Secondary | ICD-10-CM | POA: Diagnosis not present

## 2015-06-09 DIAGNOSIS — R0602 Shortness of breath: Secondary | ICD-10-CM | POA: Diagnosis not present

## 2015-06-09 DIAGNOSIS — R001 Bradycardia, unspecified: Secondary | ICD-10-CM

## 2015-06-09 DIAGNOSIS — E782 Mixed hyperlipidemia: Secondary | ICD-10-CM

## 2015-06-09 DIAGNOSIS — J449 Chronic obstructive pulmonary disease, unspecified: Secondary | ICD-10-CM | POA: Insufficient documentation

## 2015-06-09 DIAGNOSIS — R0600 Dyspnea, unspecified: Secondary | ICD-10-CM

## 2015-06-09 LAB — URINALYSIS, ROUTINE W REFLEX MICROSCOPIC
Bilirubin Urine: NEGATIVE
Hgb urine dipstick: NEGATIVE
KETONES UR: NEGATIVE
Nitrite: NEGATIVE
PH: 7 (ref 5.0–8.0)
RBC / HPF: NONE SEEN (ref 0–?)
SPECIFIC GRAVITY, URINE: 1.01 (ref 1.000–1.030)
TOTAL PROTEIN, URINE-UPE24: NEGATIVE
URINE GLUCOSE: NEGATIVE
Urobilinogen, UA: 0.2 (ref 0.0–1.0)

## 2015-06-09 LAB — CBC
HCT: 40.7 % (ref 36.0–46.0)
HEMOGLOBIN: 13.9 g/dL (ref 12.0–15.0)
MCHC: 34.2 g/dL (ref 30.0–36.0)
MCV: 89.1 fl (ref 78.0–100.0)
PLATELETS: 240 10*3/uL (ref 150.0–400.0)
RBC: 4.56 Mil/uL (ref 3.87–5.11)
RDW: 14 % (ref 11.5–15.5)
WBC: 6.4 10*3/uL (ref 4.0–10.5)

## 2015-06-09 LAB — LIPID PANEL
CHOL/HDL RATIO: 4
Cholesterol: 197 mg/dL (ref 0–200)
HDL: 52 mg/dL (ref >39.00)
LDL CALC: 126 mg/dL — AB (ref 0–99)
NONHDL: 145.31
Triglycerides: 96 mg/dL (ref 0.0–149.0)
VLDL: 19.2 mg/dL (ref 0.0–40.0)

## 2015-06-09 LAB — COMPREHENSIVE METABOLIC PANEL
ALT: 14 U/L (ref 0–35)
AST: 16 U/L (ref 0–37)
Albumin: 3.9 g/dL (ref 3.5–5.2)
Alkaline Phosphatase: 55 U/L (ref 39–117)
BILIRUBIN TOTAL: 0.4 mg/dL (ref 0.2–1.2)
BUN: 16 mg/dL (ref 6–23)
CHLORIDE: 110 meq/L (ref 96–112)
CO2: 28 meq/L (ref 19–32)
CREATININE: 0.59 mg/dL (ref 0.40–1.20)
Calcium: 9.5 mg/dL (ref 8.4–10.5)
GFR: 102.03 mL/min (ref >60.00)
GLUCOSE: 91 mg/dL (ref 70–99)
Potassium: 4.3 mEq/L (ref 3.5–5.1)
Sodium: 142 mEq/L (ref 135–145)
Total Protein: 6.5 g/dL (ref 6.0–8.3)

## 2015-06-09 LAB — TSH: TSH: 0.4 u[IU]/mL (ref 0.35–4.50)

## 2015-06-09 MED ORDER — ASPIRIN EC 81 MG PO TBEC: 81.0000 mg | DELAYED_RELEASE_TABLET | Freq: Every day | ORAL | Status: DC

## 2015-06-09 NOTE — Patient Instructions (Addendum)
[facility]   Cholesterol Cholesterol is a white, waxy, fat-like substance needed by your body in small amounts. The liver makes all the cholesterol you need. Cholesterol is carried from the liver by the blood through the blood vessels. Deposits of cholesterol (plaque) may build up on blood vessel walls. These make the arteries narrower and stiffer. Cholesterol plaques increase the risk for heart attack and stroke.  You cannot feel your cholesterol level even if it is very high. The only way to know it is high is with a blood test. Once you know your cholesterol levels, you should keep a record of the test results. Work with your health care provider to keep your levels in the desired range.  WHAT DO THE RESULTS MEAN?  Total cholesterol is a rough measure of all the cholesterol in your blood.   LDL is the so-called bad cholesterol. This is the type that deposits cholesterol in the walls of the arteries. You want this level to be low.   HDL is the good cholesterol because it cleans the arteries and carries the LDL away. You want this level to be high.  Triglycerides are fat that the body can either burn for energy or store. High levels are closely linked to heart disease.  WHAT ARE THE DESIRED LEVELS OF CHOLESTEROL?  Total cholesterol below 200.   LDL below 100 for people at risk, below 70 for those at very high risk.   HDL above 50 is good, above 60 is best.   Triglycerides below 150.  HOW CAN I LOWER MY CHOLESTEROL?  Diet. Follow your diet programs as directed by your health care provider.   Choose fish or white meat chicken and [place], roasted or baked. Limit fatty cuts of red meat, fried foods, and processed meats, such as sausage and lunch meats.   Eat lots of fresh fruits and vegetables.  Choose whole grains, beans, pasta, potatoes, and cereals.   Use only small amounts of olive, corn, or canola oils.   Avoid butter, mayonnaise, shortening, or palm kernel  oils.  Avoid foods with trans fats.   Drink skim or nonfat milk and eat low-fat or nonfat yogurt and cheeses. Avoid whole milk, cream, ice cream, egg yolks, and full-fat cheeses.   Healthy desserts include angel food cake, ginger snaps, animal crackers, hard candy, popsicles, and low-fat or nonfat frozen yogurt. Avoid pastries, cakes, pies, and cookies.   Exercise. Follow your exercise programs as directed by your health care provider.   A regular program helps decrease LDL and raise HDL.   A regular program helps with weight control.   Do things that increase your activity level like gardening, walking, or taking the stairs. Ask your health care provider about how you can be more active in your daily life.   Medicine. Take medicine only as directed by your health care provider.   Medicine may be prescribed by your health care provider to help lower cholesterol and decrease the risk for heart disease.   If you have several risk factors, you may need medicine even if your levels are normal.   This information is not intended to replace advice given to you by your health care provider. Make sure you discuss any questions you have with your health care provider.   Document Released: 01/01/2001 Document Revised: 04/29/2014 Document Reviewed: 01/20/2013 Elsevier Interactive Patient Education [employer].

## 2015-06-09 NOTE — Progress Notes (Signed)
Patient ID: Kylie Walters, female   DOB: 1925/04/22, 82 y.o.   MRN: [id number]   Subjective:    Patient ID: Kylie Walters, female    DOB: [birth year], 82 y.o.   MRN: [id number]  Chief Complaint  Patient presents with  . Follow-up    HPI Patient is in today for follow up. Is feeling tired and notes some increased dyspnea on exertion. Does note some nocturia but denies dysuria or urgency during the day. Denies CP/palp/SOB/HA/congestion/fevers/GI c/o. Taking meds as prescribed  Past Medical History  Diagnosis Date  . Hypothyroidism   . Osteopenia   . Diverticulosis   . Granuloma of skin     RLL  . Vitamin D deficiency   . GERD (gastroesophageal reflux disease)   . Hyperlipidemia   . Hearing loss   . Arthritis   . Depression 4/03  . Lumbar herniated disc   . Diverticulosis of colon without hemorrhage 10/07/2011  . History of chicken pox   . H/O measles   . H/O mumps   . Incontinence in female 02/06/2015  . CTS (carpal tunnel syndrome) 06/18/2015  . Nocturia 06/18/2015    Past Surgical History  Procedure Laterality Date  . Tonsillectomy and adenoidectomy  1939  . Dilation and curettage of uterus  1954, 1978, 1979  . Abdominal hysterectomy  1980  . Knee surgery  2004    left knee  . Cataract extraction  2010  . Breast surgery      L breast cyst  . Appendectomy      Family History  Problem Relation Age of Onset  . Breast cancer Mother     metastatic disease  . Kidney disease Father     Bright's disease  . Cancer Maternal Grandfather     prostate  . Obesity Daughter   . Thyroid disease Son   . Hyperlipidemia Son   . Heart disease Mother     Atrial fibrillation    Social History   Social History  . Marital Status: Widowed    Spouse Name: N/A  . Number of Children: 3  . Years of Education: N/A   Occupational History  . Not on file.   Social History Main Topics  . Smoking status: Former Smoker -- 0.50 packs/day for 40 years    Types: Cigarettes    Start  date: 06/09/1940    Quit date: 04/23/1979  . Smokeless tobacco: Never Used     Comment: quit smoking 40 years ago  . Alcohol Use: 0.0 oz/week    0 Standard drinks or equivalent per week     Comment: socially  . Drug Use: No  . Sexual Activity: Not Currently    Birth Control/ Protection: Post-menopausal     Comment: moved to [place], widowed. no dietary restrictions   Other Topics Concern  . Not on file   Social History Narrative    Outpatient Prescriptions Prior to Visit  Medication Sig Dispense Refill  . calcium-vitamin D (OSCAL WITH D) 500-200 MG-UNIT per tablet Take 1 tablet by mouth.    . levothyroxine (SYNTHROID, LEVOTHROID) 88 MCG tablet TAKE 1 TABLET BY MOUTH DAILY BEFORE BREAKFAST MONDAY THRU FRIDAY.  TAKE 1/2 TABLET SATURDAY AND SUNDAY 90 tablet 0  . psyllium (METAMUCIL) 58.6 % packet Take 1 packet by mouth daily.    . Naproxen Sodium (ALEVE PO) Take 1 tablet by mouth daily as needed.     No facility-administered medications prior to visit.    Allergies  Allergen  Reactions  . Latex Itching  . Lipitor [Atorvastatin Calcium] Other (See Comments)    Myalgia     Review of Systems  Constitutional: Positive for malaise/fatigue. Negative for fever.  HENT: Negative for congestion.   Eyes: Negative for discharge.  Respiratory: Positive for shortness of breath.   Cardiovascular: Negative for chest pain, palpitations and leg swelling.  Gastrointestinal: Negative for nausea and abdominal pain.  Genitourinary: Negative for dysuria.  Musculoskeletal: Positive for joint pain. Negative for falls.  Skin: Negative for rash.  Neurological: Negative for loss of consciousness and headaches.  Endo/Heme/Allergies: Negative for environmental allergies.  Psychiatric/Behavioral: Negative for depression. The patient is not nervous/anxious.        Objective:    Physical Exam  Constitutional: She is oriented to person, place, and time. She appears well-developed and  well-nourished. No distress.  HENT:  Head: Normocephalic and atraumatic.  Nose: Nose normal.  Eyes: Right eye exhibits no discharge. Left eye exhibits no discharge.  Neck: Normal range of motion. Neck supple.  Cardiovascular: Normal rate and regular rhythm.   No murmur heard. Pulmonary/Chest: Effort normal and breath sounds normal.  Abdominal: Soft. Bowel sounds are normal. There is no tenderness.  Musculoskeletal: She exhibits no edema.  Neurological: She is alert and oriented to person, place, and time.  Skin: Skin is warm and dry.  Psychiatric: She has a normal mood and affect.  Nursing note and vitals reviewed.   BP 140/70 mmHg  Pulse 48  Temp(Src) 97.6 F (36.4 C) (Oral)  Ht '5\' 5"'$  (1.651 m)  Wt 155 lb 2 oz (70.364 kg)  BMI 25.81 kg/m2  SpO2 98% Wt Readings from Last 3 Encounters:  06/14/15 154 lb (69.854 kg)  06/09/15 155 lb 2 oz (70.364 kg)  05/09/15 152 lb 3.2 oz (69.037 kg)     Lab Results  Component Value Date   WBC 6.4 06/09/2015   HGB 13.9 06/09/2015   HCT 40.7 06/09/2015   PLT 240.0 06/09/2015   GLUCOSE 91 06/09/2015   CHOL 197 06/09/2015   TRIG 96.0 06/09/2015   HDL 52.00 06/09/2015   LDLCALC 126* 06/09/2015   ALT 14 06/09/2015   AST 16 06/09/2015   NA 142 06/09/2015   K 4.3 06/09/2015   CL 110 06/09/2015   CREATININE 0.59 06/09/2015   BUN 16 06/09/2015   CO2 28 06/09/2015   TSH 0.40 06/09/2015    Lab Results  Component Value Date   TSH 0.40 06/09/2015   Lab Results  Component Value Date   WBC 6.4 06/09/2015   HGB 13.9 06/09/2015   HCT 40.7 06/09/2015   MCV 89.1 06/09/2015   PLT 240.0 06/09/2015   Lab Results  Component Value Date   NA 142 06/09/2015   K 4.3 06/09/2015   CO2 28 06/09/2015   GLUCOSE 91 06/09/2015   BUN 16 06/09/2015   CREATININE 0.59 06/09/2015   BILITOT 0.4 06/09/2015   ALKPHOS 55 06/09/2015   AST 16 06/09/2015   ALT 14 06/09/2015   PROT 6.5 06/09/2015   ALBUMIN 3.9 06/09/2015   CALCIUM 9.5 06/09/2015   GFR  102.03 06/09/2015   Lab Results  Component Value Date   CHOL 197 06/09/2015   Lab Results  Component Value Date   HDL 52.00 06/09/2015   Lab Results  Component Value Date   LDLCALC 126* 06/09/2015   Lab Results  Component Value Date   TRIG 96.0 06/09/2015   Lab Results  Component Value Date   CHOLHDL 4 06/09/2015  No results found for: HGBA1C     Assessment & Plan:   Problem List Items Addressed This Visit    CTS (carpal tunnel syndrome)    Bracing , icing, rest. Topical treatments      Dyspnea    EKG sinus bradycardia today also ordered Echo, CXR      GERD (gastroesophageal reflux disease)    Avoid offending foods, start probiotics. Do not eat large meals in late evening and consider raising head of bed.       Hyperlipidemia, mixed    Encouraged heart healthy diet, increase exercise, avoid trans fats, consider a krill oil cap daily      Hypothyroidism    On Levothyroxine, continue to monitor      Relevant Orders   CBC (Completed)   TSH (Completed)   Comprehensive metabolic panel (Completed)   Lipid panel (Completed)   Echocardiogram   DG Chest 2 View (Completed)   Ambulatory referral to Cardiology   Incontinence in female - Primary    PT has helped some, is now getting 2-3 hours before she has an episode of urgency.       Relevant Orders   CBC (Completed)   TSH (Completed)   Comprehensive metabolic panel (Completed)   Lipid panel (Completed)   Echocardiogram   DG Chest 2 View (Completed)   Urinalysis   Urine culture (Completed)   Nocturia    Check UA and culture       Other Visit Diagnoses    SOB (shortness of breath)        Relevant Orders    CBC (Completed)    TSH (Completed)    Comprehensive metabolic panel (Completed)    Lipid panel (Completed)    Echocardiogram    DG Chest 2 View (Completed)    EKG 12-Lead (Completed)    Ambulatory referral to Cardiology    Mixed hyperlipidemia        Relevant Orders    CBC (Completed)     TSH (Completed)    Comprehensive metabolic panel (Completed)    Lipid panel (Completed)    Echocardiogram    DG Chest 2 View (Completed)    Ambulatory referral to Cardiology    Coronary artery disease involving coronary bypass graft of native heart without angina pectoris        Relevant Orders    CBC (Completed)    TSH (Completed)    Comprehensive metabolic panel (Completed)    Lipid panel (Completed)    Echocardiogram    DG Chest 2 View (Completed)    Ambulatory referral to Cardiology    Bradycardia        Relevant Orders    Ambulatory referral to Cardiology       I have discontinued Ms. Brull's Naproxen Sodium (ALEVE PO). I am also having her maintain her calcium-vitamin D, psyllium, levothyroxine, and KRILL OIL PO.  Meds ordered this encounter  Medications  . KRILL OIL PO    Sig: Take by mouth daily.  Marland Kitchen DISCONTD: aspirin EC 81 MG tablet    Sig: Take 1 tablet (81 mg total) by mouth daily.     Penni Homans, MD

## 2015-06-09 NOTE — Progress Notes (Signed)
Pre visit review using our clinic review tool, if applicable. No additional management support is needed unless otherwise documented below in the visit note. 

## 2015-06-09 NOTE — Assessment & Plan Note (Signed)
On Levothyroxine, continue to monitor 

## 2015-06-09 NOTE — Assessment & Plan Note (Signed)
PT has helped some, is now getting 2-3 hours before she has an episode of urgency.

## 2015-06-11 LAB — URINE CULTURE: Colony Count: 25000

## 2015-06-12 ENCOUNTER — Other Ambulatory Visit (INDEPENDENT_AMBULATORY_CARE_PROVIDER_SITE_OTHER): Payer: Medicare Other

## 2015-06-12 DIAGNOSIS — N393 Stress incontinence (female) (male): Secondary | ICD-10-CM | POA: Diagnosis not present

## 2015-06-12 LAB — URINALYSIS, ROUTINE W REFLEX MICROSCOPIC
Bilirubin Urine: NEGATIVE
Hgb urine dipstick: NEGATIVE
KETONES UR: NEGATIVE
Leukocytes, UA: NEGATIVE
Nitrite: NEGATIVE
RBC / HPF: NONE SEEN (ref 0–?)
SPECIFIC GRAVITY, URINE: 1.02 (ref 1.000–1.030)
Total Protein, Urine: NEGATIVE
UROBILINOGEN UA: 0.2 (ref 0.0–1.0)
Urine Glucose: NEGATIVE
pH: 6 (ref 5.0–8.0)

## 2015-06-14 ENCOUNTER — Encounter: Payer: Self-pay | Admitting: Cardiology

## 2015-06-14 ENCOUNTER — Ambulatory Visit (INDEPENDENT_AMBULATORY_CARE_PROVIDER_SITE_OTHER): Payer: Medicare Other | Admitting: Cardiology

## 2015-06-14 VITALS — BP 139/62 | HR 70 | Ht 65.0 in | Wt 154.0 lb

## 2015-06-14 DIAGNOSIS — I25810 Atherosclerosis of coronary artery bypass graft(s) without angina pectoris: Secondary | ICD-10-CM

## 2015-06-14 DIAGNOSIS — R0600 Dyspnea, unspecified: Secondary | ICD-10-CM

## 2015-06-14 DIAGNOSIS — R001 Bradycardia, unspecified: Secondary | ICD-10-CM

## 2015-06-14 LAB — URINE CULTURE

## 2015-06-14 NOTE — Assessment & Plan Note (Signed)
Etiology unclear. Will arrange echocardiogram to assess LV function. Question contribution from COPD.

## 2015-06-14 NOTE — Progress Notes (Signed)
HPI: 82 year old female for evaluation of dyspnea. Chest x-ray February 2017 showed COPD and cardiomegaly. Laboratories February 2017 showed normal hemoglobin, TSH, potassium and BUN/creatinine. Patient describes dyspnea on exertion but no orthopnea, PND, pedal edema, chest pain, palpitations or syncope.  Current Outpatient Prescriptions  Medication Sig Dispense Refill  . calcium-vitamin D (OSCAL WITH D) 500-200 MG-UNIT per tablet Take 1 tablet by mouth.    Marland Kitchen KRILL OIL PO Take by mouth daily.    Marland Kitchen levothyroxine (SYNTHROID, LEVOTHROID) 88 MCG tablet TAKE 1 TABLET BY MOUTH DAILY BEFORE BREAKFAST MONDAY THRU FRIDAY.  TAKE 1/2 TABLET SATURDAY AND SUNDAY 90 tablet 0  . psyllium (METAMUCIL) 58.6 % packet Take 1 packet by mouth daily.     No current facility-administered medications for this visit.    Allergies  Allergen Reactions  . Latex Itching  . Lipitor [Atorvastatin Calcium] Other (See Comments)    Myalgia      Past Medical History  Diagnosis Date  . Hypothyroidism   . Osteopenia   . Diverticulosis   . Granuloma of skin     RLL  . Vitamin D deficiency   . GERD (gastroesophageal reflux disease)   . Hyperlipidemia   . Hearing loss   . Arthritis   . Depression 4/03  . Lumbar herniated disc   . Diverticulosis of colon without hemorrhage 10/07/2011  . History of chicken pox   . H/O measles   . H/O mumps   . Incontinence in female 02/06/2015    Past Surgical History  Procedure Laterality Date  . Tonsillectomy and adenoidectomy  1939  . Dilation and curettage of uterus  1954, 1978, 1979  . Abdominal hysterectomy  1980  . Knee surgery  2004    left knee  . Cataract extraction  2010  . Breast surgery      L breast cyst  . Appendectomy      Social History   Social History  . Marital Status: Widowed    Spouse Name: N/A  . Number of Children: 3  . Years of Education: N/A   Occupational History  . Not on file.   Social History Main Topics  . Smoking status:  Former Smoker -- 0.50 packs/day for 40 years    Types: Cigarettes    Start date: 06/09/1940    Quit date: 04/23/1979  . Smokeless tobacco: Never Used     Comment: quit smoking 40 years ago  . Alcohol Use: 0.0 oz/week    0 Standard drinks or equivalent per week     Comment: socially  . Drug Use: No  . Sexual Activity: Not Currently    Birth Control/ Protection: Post-menopausal     Comment: moved to [place], widowed. no dietary restrictions   Other Topics Concern  . Not on file   Social History Narrative    Family History  Problem Relation Age of Onset  . Breast cancer Mother     metastatic disease  . Kidney disease Father     Bright's disease  . Cancer Maternal Grandfather     prostate  . Obesity Daughter   . Thyroid disease Son   . Hyperlipidemia Son   . Heart disease Mother     Atrial fibrillation    ROS: no fevers or chills, productive cough, hemoptysis, dysphasia, odynophagia, melena, hematochezia, dysuria, hematuria, rash, seizure activity, orthopnea, PND, pedal edema, claudication. Remaining systems are negative.  Physical Exam:   Blood pressure 139/62, pulse 70, height '5\' 5"'$  (1.651  m), weight 154 lb (69.854 kg), SpO2 96 %.  General:  Well developed/well nourished in NAD Skin warm/dry Patient not depressed No peripheral clubbing Back-normal HEENT-normal/normal eyelids Neck supple/normal carotid upstroke bilaterally; no bruits; no JVD; no thyromegaly chest - CTA/ normal expansion CV - RRR/normal S1 and S2; no murmurs, rubs or gallops;  PMI nondisplaced Abdomen -NT/ND, no HSM, no mass, + bowel sounds, no bruit 2+ femoral pulses, no bruits Ext-no edema, chords, 2+ DP Neuro-grossly nonfocal  ECG 06/09/2015-sinus bradycardia with no ST changes.

## 2015-06-14 NOTE — Patient Instructions (Signed)
Medication Instructions:   NO CHANGE  Testing/Procedures:  Your physician has requested that you have an echocardiogram. Echocardiography is a painless test that uses sound waves to create images of your heart. It provides your doctor with information about the size and shape of your heart and how well your heart's chambers and valves are working. This procedure takes approximately one hour. There are no restrictions for this procedure.    Follow-Up:  Your physician wants you to follow-up in: [place] will receive a reminder letter in the mail two months in advance. If you don't receive a letter, please call our office to schedule the follow-up appointment. V

## 2015-06-14 NOTE — Assessment & Plan Note (Signed)
Management per primary care. 

## 2015-06-14 NOTE — Assessment & Plan Note (Signed)
Heart rate is 54 on electrocardiogram but is sinus rhythm. No symptoms. No further evaluation.

## 2015-06-15 ENCOUNTER — Telehealth: Payer: Self-pay | Admitting: Family Medicine

## 2015-06-15 NOTE — Telephone Encounter (Signed)
Caller name: [name]   Relationship to patient: self  Can be reached: [phone]  Reason for call: pt is returning your call.

## 2015-06-15 NOTE — Telephone Encounter (Signed)
Patient informed of urine culture results.

## 2015-06-18 ENCOUNTER — Encounter: Payer: Self-pay | Admitting: Family Medicine

## 2015-06-18 DIAGNOSIS — G5600 Carpal tunnel syndrome, unspecified upper limb: Secondary | ICD-10-CM | POA: Insufficient documentation

## 2015-06-18 DIAGNOSIS — R351 Nocturia: Secondary | ICD-10-CM

## 2015-06-18 HISTORY — DX: Carpal tunnel syndrome, unspecified upper limb: G56.00

## 2015-06-18 HISTORY — DX: Nocturia: R35.1

## 2015-06-18 NOTE — Assessment & Plan Note (Signed)
Bracing , icing, rest. Topical treatments

## 2015-06-18 NOTE — Assessment & Plan Note (Addendum)
EKG sinus bradycardia today also ordered Echo, CXR

## 2015-06-18 NOTE — Assessment & Plan Note (Signed)
Encouraged heart healthy diet, increase exercise, avoid trans fats, consider a krill oil cap daily 

## 2015-06-18 NOTE — Assessment & Plan Note (Signed)
Avoid offending foods, start probiotics. Do not eat large meals in late evening and consider raising head of bed.  

## 2015-06-18 NOTE — Assessment & Plan Note (Signed)
Check UA and culture 

## 2015-06-28 ENCOUNTER — Ambulatory Visit (HOSPITAL_BASED_OUTPATIENT_CLINIC_OR_DEPARTMENT_OTHER)
Admission: RE | Admit: 2015-06-28 | Discharge: 2015-06-28 | Disposition: A | Discharge status: Home / Self Care | Payer: Medicare Other | Source: Ambulatory Visit | Attending: Cardiology | Admitting: Cardiology

## 2015-06-28 DIAGNOSIS — I351 Nonrheumatic aortic (valve) insufficiency: Secondary | ICD-10-CM | POA: Diagnosis not present

## 2015-06-28 DIAGNOSIS — E785 Hyperlipidemia, unspecified: Secondary | ICD-10-CM | POA: Insufficient documentation

## 2015-06-28 DIAGNOSIS — R0600 Dyspnea, unspecified: Secondary | ICD-10-CM | POA: Insufficient documentation

## 2015-06-28 DIAGNOSIS — I517 Cardiomegaly: Secondary | ICD-10-CM | POA: Diagnosis not present

## 2015-06-28 DIAGNOSIS — I313 Pericardial effusion (noninflammatory): Secondary | ICD-10-CM | POA: Insufficient documentation

## 2015-06-28 DIAGNOSIS — I340 Nonrheumatic mitral (valve) insufficiency: Secondary | ICD-10-CM | POA: Insufficient documentation

## 2015-06-28 NOTE — Progress Notes (Signed)
  Echocardiogram 2D Echocardiogram has been performed.  Kylie Walters 06/28/2015, 12:05 PM

## 2015-07-04 ENCOUNTER — Ambulatory Visit (HOSPITAL_BASED_OUTPATIENT_CLINIC_OR_DEPARTMENT_OTHER)
Admission: RE | Admit: 2015-07-04 | Discharge: 2015-07-04 | Disposition: A | Discharge status: Home / Self Care | Payer: Medicare Other | Source: Ambulatory Visit | Attending: Family Medicine | Admitting: Family Medicine

## 2015-07-04 DIAGNOSIS — Z1231 Encounter for screening mammogram for malignant neoplasm of breast: Secondary | ICD-10-CM | POA: Diagnosis present

## 2015-07-24 ENCOUNTER — Encounter: Payer: Self-pay | Admitting: Family Medicine

## 2015-07-24 ENCOUNTER — Ambulatory Visit (INDEPENDENT_AMBULATORY_CARE_PROVIDER_SITE_OTHER): Payer: Medicare Other | Admitting: Family Medicine

## 2015-07-24 VITALS — BP 160/76 | HR 56 | Temp 98.1°F | Ht 65.0 in | Wt 157.6 lb

## 2015-07-24 DIAGNOSIS — I10 Essential (primary) hypertension: Secondary | ICD-10-CM

## 2015-07-24 DIAGNOSIS — E782 Mixed hyperlipidemia: Secondary | ICD-10-CM | POA: Diagnosis not present

## 2015-07-24 DIAGNOSIS — IMO0001 Reserved for inherently not codable concepts without codable children: Secondary | ICD-10-CM

## 2015-07-24 DIAGNOSIS — K219 Gastro-esophageal reflux disease without esophagitis: Secondary | ICD-10-CM | POA: Diagnosis not present

## 2015-07-24 DIAGNOSIS — I25810 Atherosclerosis of coronary artery bypass graft(s) without angina pectoris: Secondary | ICD-10-CM | POA: Diagnosis not present

## 2015-07-24 DIAGNOSIS — H9319 Tinnitus, unspecified ear: Secondary | ICD-10-CM

## 2015-07-24 DIAGNOSIS — H9313 Tinnitus, bilateral: Secondary | ICD-10-CM | POA: Diagnosis not present

## 2015-07-24 DIAGNOSIS — E038 Other specified hypothyroidism: Secondary | ICD-10-CM | POA: Diagnosis not present

## 2015-07-24 DIAGNOSIS — R030 Elevated blood-pressure reading, without diagnosis of hypertension: Secondary | ICD-10-CM

## 2015-07-24 DIAGNOSIS — E559 Vitamin D deficiency, unspecified: Secondary | ICD-10-CM

## 2015-07-24 DIAGNOSIS — Z23 Encounter for immunization: Secondary | ICD-10-CM

## 2015-07-24 HISTORY — DX: Essential (primary) hypertension: I10

## 2015-07-24 HISTORY — DX: Tinnitus, unspecified ear: H93.19

## 2015-07-24 HISTORY — DX: Reserved for inherently not codable concepts without codable children: IMO0001

## 2015-07-24 NOTE — Assessment & Plan Note (Signed)
On Levothyroxine, continue to monitor 

## 2015-07-24 NOTE — Assessment & Plan Note (Signed)
Avoid offending foods, start probiotics. Do not eat large meals in late evening and consider raising head of bed.  

## 2015-07-24 NOTE — Progress Notes (Signed)
Subjective:    Patient ID: Kylie Walters, female    DOB: 08-Jan-1925, 82 y.o.   MRN: [id number]  Chief Complaint  Patient presents with  . Follow-up    HPI Patient is in today for follow up with some concerns having some ringing in ears, has some concerns about hearing aid and patient reports having a heating test.  Patient has been to see about hearing aids, but has been searching for some reasonable hearing aids.  Patient also reports having some shortness of breath with exercision, states she has been slowing down and taking her time, and exercising frequently.  Denies CP/palp/SOB/HA/congestion/fevers/GI or GU c/o. Taking meds as prescribed.    Past Medical History  Diagnosis Date  . Hypothyroidism   . Osteopenia   . Diverticulosis   . Granuloma of skin     RLL  . Vitamin D deficiency   . GERD (gastroesophageal reflux disease)   . Hyperlipidemia   . Hearing loss   . Arthritis   . Depression 4/03  . Lumbar herniated disc   . Diverticulosis of colon without hemorrhage 10/07/2011  . History of chicken pox   . H/O measles   . H/O mumps   . Incontinence in female 02/06/2015  . CTS (carpal tunnel syndrome) 06/18/2015  . Nocturia 06/18/2015  . Tinnitus 07/24/2015  . Elevated blood pressure 07/24/2015    Past Surgical History  Procedure Laterality Date  . Tonsillectomy and adenoidectomy  1939  . Dilation and curettage of uterus  1954, 1978, 1979  . Abdominal hysterectomy  1980  . Knee surgery  2004    left knee  . Cataract extraction  2010  . Breast surgery      L breast cyst  . Appendectomy      Family History  Problem Relation Age of Onset  . Breast cancer Mother     metastatic disease  . Kidney disease Father     Bright's disease  . Cancer Maternal Grandfather     prostate  . Obesity Daughter   . Thyroid disease Son   . Hyperlipidemia Son   . Heart disease Mother     Atrial fibrillation    Social History   Social History  . Marital Status: Widowed   Spouse Name: N/A  . Number of Children: 3  . Years of Education: N/A   Occupational History  . Not on file.   Social History Main Topics  . Smoking status: Former Smoker -- 0.50 packs/day for 40 years    Types: Cigarettes    Start date: 06/09/1940    Quit date: 04/23/1979  . Smokeless tobacco: Never Used     Comment: quit smoking 40 years ago  . Alcohol Use: 0.0 oz/week    0 Standard drinks or equivalent per week     Comment: socially  . Drug Use: No  . Sexual Activity: Not Currently    Birth Control/ Protection: Post-menopausal     Comment: moved to [place], widowed. no dietary restrictions   Other Topics Concern  . Not on file   Social History Narrative    Outpatient Prescriptions Prior to Visit  Medication Sig Dispense Refill  . calcium-vitamin D (OSCAL WITH D) 500-200 MG-UNIT per tablet Take 1 tablet by mouth.    Marland Kitchen KRILL OIL PO Take by mouth daily.    Marland Kitchen levothyroxine (SYNTHROID, LEVOTHROID) 88 MCG tablet TAKE 1 TABLET BY MOUTH DAILY BEFORE BREAKFAST MONDAY THRU FRIDAY.  TAKE 1/2 TABLET SATURDAY AND SUNDAY  90 tablet 0  . psyllium (METAMUCIL) 58.6 % packet Take 1 packet by mouth daily.     No facility-administered medications prior to visit.    Allergies  Allergen Reactions  . Latex Itching  . Lipitor [Atorvastatin Calcium] Other (See Comments)    Myalgia     Review of Systems  Constitutional: Negative for fever and malaise/fatigue.  HENT: Positive for tinnitus. Negative for congestion.   Eyes: Negative for blurred vision.  Respiratory: Negative for shortness of breath.   Cardiovascular: Negative for chest pain, palpitations and leg swelling.  Gastrointestinal: Negative for nausea, abdominal pain and blood in stool.  Genitourinary: Negative for dysuria and frequency.  Musculoskeletal: Negative for falls.  Skin: Negative for rash.  Neurological: Positive for speech change. Negative for dizziness, loss of consciousness and headaches.  Endo/Heme/Allergies:  Negative for environmental allergies.  Psychiatric/Behavioral: Negative for depression. The patient is not nervous/anxious.        Objective:    Physical Exam  Constitutional: She is oriented to person, place, and time. She appears well-developed and well-nourished. No distress.  HENT:  Head: Normocephalic and atraumatic.  Eyes: Conjunctivae are normal.  Neck: Neck supple. No thyromegaly present.  Cardiovascular: Normal rate, regular rhythm and normal heart sounds.   No murmur heard. Pulmonary/Chest: Effort normal and breath sounds normal. No respiratory distress.  Abdominal: Soft. Bowel sounds are normal. She exhibits no distension and no mass. There is no tenderness.  Musculoskeletal: She exhibits no edema.  Lymphadenopathy:    She has no cervical adenopathy.  Neurological: She is alert and oriented to person, place, and time.  Skin: Skin is warm and dry.  Psychiatric: She has a normal mood and affect. Her behavior is normal.    BP 160/76 mmHg  Pulse 56  Temp(Src) 98.1 F (36.7 C) (Oral)  Ht '5\' 5"'$  (1.651 m)  Wt 157 lb 9 oz (71.47 kg)  BMI 26.22 kg/m2  SpO2 99% Wt Readings from Last 3 Encounters:  07/24/15 157 lb 9 oz (71.47 kg)  06/14/15 154 lb (69.854 kg)  06/09/15 155 lb 2 oz (70.364 kg)     Lab Results  Component Value Date   WBC 6.4 06/09/2015   HGB 13.9 06/09/2015   HCT 40.7 06/09/2015   PLT 240.0 06/09/2015   GLUCOSE 91 06/09/2015   CHOL 197 06/09/2015   TRIG 96.0 06/09/2015   HDL 52.00 06/09/2015   LDLCALC 126* 06/09/2015   ALT 14 06/09/2015   AST 16 06/09/2015   NA 142 06/09/2015   K 4.3 06/09/2015   CL 110 06/09/2015   CREATININE 0.59 06/09/2015   BUN 16 06/09/2015   CO2 28 06/09/2015   TSH 0.40 06/09/2015    Lab Results  Component Value Date   TSH 0.40 06/09/2015   Lab Results  Component Value Date   WBC 6.4 06/09/2015   HGB 13.9 06/09/2015   HCT 40.7 06/09/2015   MCV 89.1 06/09/2015   PLT 240.0 06/09/2015   Lab Results  Component  Value Date   NA 142 06/09/2015   K 4.3 06/09/2015   CO2 28 06/09/2015   GLUCOSE 91 06/09/2015   BUN 16 06/09/2015   CREATININE 0.59 06/09/2015   BILITOT 0.4 06/09/2015   ALKPHOS 55 06/09/2015   AST 16 06/09/2015   ALT 14 06/09/2015   PROT 6.5 06/09/2015   ALBUMIN 3.9 06/09/2015   CALCIUM 9.5 06/09/2015   GFR 102.03 06/09/2015   Lab Results  Component Value Date   CHOL 197 06/09/2015  Lab Results  Component Value Date   HDL 52.00 06/09/2015   Lab Results  Component Value Date   LDLCALC 126* 06/09/2015   Lab Results  Component Value Date   TRIG 96.0 06/09/2015   Lab Results  Component Value Date   CHOLHDL 4 06/09/2015   No results found for: HGBA1C     Assessment & Plan:   Problem List Items Addressed This Visit    Elevated blood pressure    Improved with recheck but not controlled. Encouraged heart healthy diet such as the DASH diet and exercise as tolerated. Patient resistent to medication. Will monitor this month and avoid sodium and caffeine, get adewquate rest and exercise, if no improvement she will consider meds.       GERD (gastroesophageal reflux disease) - Primary    Avoid offending foods, start probiotics. Do not eat large meals in late evening and consider raising head of bed.       Hyperlipidemia, mixed    Encouraged heart healthy diet, increase exercise, avoid trans fats, consider a krill oil cap daily      Hypothyroidism    On Levothyroxine, continue to monitor      Tinnitus    With hearing loss, hearing aides have been recommended. Is considering but disinclined.       Vitamin D deficiency    Encouraged daily supplements       Other Visit Diagnoses    Need for 23-polyvalent pneumococcal polysaccharide vaccine        Relevant Orders    Pneumococcal polysaccharide vaccine 23-valent greater than or equal to 2yo subcutaneous/IM       I am having Ms. Cafiero maintain her calcium-vitamin D, psyllium, levothyroxine, and KRILL OIL  PO.  No orders of the defined types were placed in this encounter.     Penni Homans, MD

## 2015-07-24 NOTE — Assessment & Plan Note (Signed)
With hearing loss, hearing aides have been recommended. Is considering but disinclined.

## 2015-07-24 NOTE — Assessment & Plan Note (Signed)
Encouraged daily supplements 

## 2015-07-24 NOTE — Patient Instructions (Addendum)
Try [facility] for hearing aids.  Watch sodium and nurse blood pressure check in month.  Check blood pressure daily and record them, make sure you have sit about 5 minutes before taking blood pressure. Hypertension Hypertension, commonly called high blood pressure, is when the force of blood pumping through your arteries is too strong. Your arteries are the blood vessels that carry blood from your heart throughout your body. A blood pressure reading consists of a higher number over a lower number, such as 110/72. The higher number (systolic) is the pressure inside your arteries when your heart pumps. The lower number (diastolic) is the pressure inside your arteries when your heart relaxes. Ideally you want your blood pressure below 120/80. Hypertension forces your heart to work harder to pump blood. Your arteries may become narrow or stiff. Having untreated or uncontrolled hypertension can cause heart attack, stroke, kidney disease, and other problems. RISK FACTORS Some risk factors for high blood pressure are controllable. Others are not.  Risk factors you cannot control include:   Race. You may be at higher risk if you are African American.  Age. Risk increases with age.  Gender. Men are at higher risk than women before age 47 years. After age 77, women are at higher risk than men. Risk factors you can control include:  Not getting enough exercise or physical activity.  Being overweight.  Getting too much fat, sugar, calories, or salt in your diet.  Drinking too much alcohol. SIGNS AND SYMPTOMS Hypertension does not usually cause signs or symptoms. Extremely high blood pressure (hypertensive crisis) may cause headache, anxiety, shortness of breath, and nosebleed. DIAGNOSIS To check if you have hypertension, your health care provider will measure your blood pressure while you are seated, with your arm held at the level of your heart. It should be measured at least twice using the same arm.  Certain conditions can cause a difference in blood pressure between your right and left arms. A blood pressure reading that is higher than normal on one occasion does not mean that you need treatment. If it is not clear whether you have high blood pressure, you may be asked to return on a different day to have your blood pressure checked again. Or, you may be asked to monitor your blood pressure at home for 1 or more weeks. TREATMENT Treating high blood pressure includes making lifestyle changes and possibly taking medicine. Living a healthy lifestyle can help lower high blood pressure. You may need to change some of your habits. Lifestyle changes may include:  Following the DASH diet. This diet is high in fruits, vegetables, and whole grains. It is low in salt, red meat, and added sugars.  Keep your sodium intake below 2,300 mg per day.  Getting at least 30-45 minutes of aerobic exercise at least 4 times per week.  Losing weight if necessary.  Not smoking.  Limiting alcoholic beverages.  Learning ways to reduce stress. Your health care provider may prescribe medicine if lifestyle changes are not enough to get your blood pressure under control, and if one of the following is true:  You are 80-47 years of age and your systolic blood pressure is above 140.  You are 49 years of age or older, and your systolic blood pressure is above 150.  Your diastolic blood pressure is above 90.  You have diabetes, and your systolic blood pressure is over 314 or your diastolic blood pressure is over 90.  You have kidney disease and your blood pressure  is above 140/90.  You have heart disease and your blood pressure is above 140/90. Your personal target blood pressure may vary depending on your medical conditions, your age, and other factors. HOME CARE INSTRUCTIONS  Have your blood pressure rechecked as directed by your health care provider.   Take medicines only as directed by your health care  provider. Follow the directions carefully. Blood pressure medicines must be taken as prescribed. The medicine does not work as well when you skip doses. Skipping doses also puts you at risk for problems.  Do not smoke.   Monitor your blood pressure at home as directed by your health care provider. SEEK MEDICAL CARE IF:   You think you are having a reaction to medicines taken.  You have recurrent headaches or feel dizzy.  You have swelling in your ankles.  You have trouble with your vision. SEEK IMMEDIATE MEDICAL CARE IF:  You develop a severe headache or confusion.  You have unusual weakness, numbness, or feel faint.  You have severe chest or abdominal pain.  You vomit repeatedly.  You have trouble breathing. MAKE SURE YOU:   Understand these instructions.  Will watch your condition.  Will get help right away if you are not doing well or get worse.   This information is not intended to replace advice given to you by your health care provider. Make sure you discuss any questions you have with your health care provider.   Document Released: 04/08/2005 Document Revised: 08/23/2014 Document Reviewed: 01/29/2013 Elsevier Interactive Patient Education [employer].

## 2015-07-24 NOTE — Progress Notes (Signed)
Pre visit review using our clinic review tool, if applicable. No additional management support is needed unless otherwise documented below in the visit note. 

## 2015-07-24 NOTE — Assessment & Plan Note (Addendum)
Improved with recheck but not controlled. Encouraged heart healthy diet such as the DASH diet and exercise as tolerated. Patient resistent to medication. Will monitor this month and avoid sodium and caffeine, get adewquate rest and exercise, if no improvement she will consider meds.

## 2015-07-24 NOTE — Assessment & Plan Note (Signed)
Encouraged heart healthy diet, increase exercise, avoid trans fats, consider a krill oil cap daily 

## 2015-07-28 DIAGNOSIS — B351 Tinea unguium: Secondary | ICD-10-CM | POA: Diagnosis not present

## 2015-07-28 DIAGNOSIS — M79674 Pain in right toe(s): Secondary | ICD-10-CM | POA: Diagnosis not present

## 2015-07-28 DIAGNOSIS — M79675 Pain in left toe(s): Secondary | ICD-10-CM | POA: Diagnosis not present

## 2015-08-04 ENCOUNTER — Other Ambulatory Visit: Payer: Self-pay | Admitting: Family Medicine

## 2015-08-24 ENCOUNTER — Ambulatory Visit (INDEPENDENT_AMBULATORY_CARE_PROVIDER_SITE_OTHER): Payer: Medicare Other | Admitting: Medical

## 2015-08-24 VITALS — BP 148/74 | HR 59

## 2015-08-24 DIAGNOSIS — IMO0001 Reserved for inherently not codable concepts without codable children: Secondary | ICD-10-CM

## 2015-08-24 DIAGNOSIS — I25810 Atherosclerosis of coronary artery bypass graft(s) without angina pectoris: Secondary | ICD-10-CM | POA: Diagnosis not present

## 2015-08-24 DIAGNOSIS — R030 Elevated blood-pressure reading, without diagnosis of hypertension: Secondary | ICD-10-CM | POA: Diagnosis not present

## 2015-08-24 MED ORDER — LISINOPRIL 10 MG PO TABS: 10.0000 mg | ORAL_TABLET | Freq: Every day | ORAL | Status: DC

## 2015-08-24 NOTE — Progress Notes (Signed)
Pre visit review using our clinic review tool, if applicable. No additional management support is needed unless otherwise documented below in the visit note.  Per 07/24/15 AVS: Watch sodium and nurse blood pressure check in month.  Pt is currently on no BP medications. Memory on home BP cuff shows poor control of BP, with average readings ranging from 140s-160s/70s-90s.  Per Mackie Pai, PA-C: Start lisinopril 10 mg daily. Follow up in 2 weeks for OV w/ Dr. Charlett Blake and BMET.  Pt notified of instructions and verbalized understanding. Medication filled to pharmacy as requested. OV scheduled w/ Dr. Charlett Blake.  Dorrene German, RN  I did discuss pt bp with RN. Above was advise given.    Saguier, Percell Miller, PA-C

## 2015-08-24 NOTE — Progress Notes (Signed)
RN blood check note reviewed. Agree with documention and plan. 

## 2015-09-07 DIAGNOSIS — H903 Sensorineural hearing loss, bilateral: Secondary | ICD-10-CM | POA: Diagnosis not present

## 2015-09-08 ENCOUNTER — Encounter: Payer: Self-pay | Admitting: Family Medicine

## 2015-09-08 ENCOUNTER — Ambulatory Visit (INDEPENDENT_AMBULATORY_CARE_PROVIDER_SITE_OTHER): Payer: Medicare Other | Admitting: Family Medicine

## 2015-09-08 VITALS — BP 110/60 | HR 76 | Temp 98.2°F | Ht 65.0 in | Wt 153.1 lb

## 2015-09-08 DIAGNOSIS — I25810 Atherosclerosis of coronary artery bypass graft(s) without angina pectoris: Secondary | ICD-10-CM

## 2015-09-08 DIAGNOSIS — I10 Essential (primary) hypertension: Secondary | ICD-10-CM | POA: Diagnosis not present

## 2015-09-08 DIAGNOSIS — K219 Gastro-esophageal reflux disease without esophagitis: Secondary | ICD-10-CM | POA: Diagnosis not present

## 2015-09-08 DIAGNOSIS — M8580 Other specified disorders of bone density and structure, unspecified site: Secondary | ICD-10-CM

## 2015-09-08 DIAGNOSIS — E039 Hypothyroidism, unspecified: Secondary | ICD-10-CM

## 2015-09-08 NOTE — Patient Instructions (Signed)
Hypertension Hypertension, commonly called high blood pressure, is when the force of blood pumping through your arteries is too strong. Your arteries are the blood vessels that carry blood from your heart throughout your body. A blood pressure reading consists of a higher number over a lower number, such as 110/72. The higher number (systolic) is the pressure inside your arteries when your heart pumps. The lower number (diastolic) is the pressure inside your arteries when your heart relaxes. Ideally you want your blood pressure below 120/80. Hypertension forces your heart to work harder to pump blood. Your arteries may become narrow or stiff. Having untreated or uncontrolled hypertension can cause heart attack, stroke, kidney disease, and other problems. RISK FACTORS Some risk factors for high blood pressure are controllable. Others are not.  Risk factors you cannot control include:   Race. You may be at higher risk if you are African American.  Age. Risk increases with age.  Gender. Men are at higher risk than women before age 45 years. After age 65, women are at higher risk than men. Risk factors you can control include:  Not getting enough exercise or physical activity.  Being overweight.  Getting too much fat, sugar, calories, or salt in your diet.  Drinking too much alcohol. SIGNS AND SYMPTOMS Hypertension does not usually cause signs or symptoms. Extremely high blood pressure (hypertensive crisis) may cause headache, anxiety, shortness of breath, and nosebleed. DIAGNOSIS To check if you have hypertension, your health care provider will measure your blood pressure while you are seated, with your arm held at the level of your heart. It should be measured at least twice using the same arm. Certain conditions can cause a difference in blood pressure between your right and left arms. A blood pressure reading that is higher than normal on one occasion does not mean that you need treatment. If  it is not clear whether you have high blood pressure, you may be asked to return on a different day to have your blood pressure checked again. Or, you may be asked to monitor your blood pressure at home for 1 or more weeks. TREATMENT Treating high blood pressure includes making lifestyle changes and possibly taking medicine. Living a healthy lifestyle can help lower high blood pressure. You may need to change some of your habits. Lifestyle changes may include:  Following the DASH diet. This diet is high in fruits, vegetables, and whole grains. It is low in salt, red meat, and added sugars.  Keep your sodium intake below 2,300 mg per day.  Getting at least 30-45 minutes of aerobic exercise at least 4 times per week.  Losing weight if necessary.  Not smoking.  Limiting alcoholic beverages.  Learning ways to reduce stress. Your health care provider may prescribe medicine if lifestyle changes are not enough to get your blood pressure under control, and if one of the following is true:  You are 18-59 years of age and your systolic blood pressure is above 140.  You are 60 years of age or older, and your systolic blood pressure is above 150.  Your diastolic blood pressure is above 90.  You have diabetes, and your systolic blood pressure is over 140 or your diastolic blood pressure is over 90.  You have kidney disease and your blood pressure is above 140/90.  You have heart disease and your blood pressure is above 140/90. Your personal target blood pressure may vary depending on your medical conditions, your age, and other factors. HOME CARE INSTRUCTIONS    Have your blood pressure rechecked as directed by your health care provider.   Take medicines only as directed by your health care provider. Follow the directions carefully. Blood pressure medicines must be taken as prescribed. The medicine does not work as well when you skip doses. Skipping doses also puts you at risk for  problems.  Do not smoke.   Monitor your blood pressure at home as directed by your health care provider. SEEK MEDICAL CARE IF:   You think you are having a reaction to medicines taken.  You have recurrent headaches or feel dizzy.  You have swelling in your ankles.  You have trouble with your vision. SEEK IMMEDIATE MEDICAL CARE IF:  You develop a severe headache or confusion.  You have unusual weakness, numbness, or feel faint.  You have severe chest or abdominal pain.  You vomit repeatedly.  You have trouble breathing. MAKE SURE YOU:   Understand these instructions.  Will watch your condition.  Will get help right away if you are not doing well or get worse.   This information is not intended to replace advice given to you by your health care provider. Make sure you discuss any questions you have with your health care provider.   Document Released: 04/08/2005 Document Revised: 08/23/2014 Document Reviewed: 01/29/2013 Elsevier Interactive Patient Education 2016 Elsevier Inc.  

## 2015-09-08 NOTE — Assessment & Plan Note (Addendum)
Well controlled, no changes to meds. Encouraged heart healthy diet such as the DASH diet and exercise as tolerated.  Taking just 1/2 tab po daily

## 2015-09-08 NOTE — Progress Notes (Signed)
Subjective:    Patient ID: Kylie Walters, female    DOB: January 13, 1925, 82 y.o.   MRN: [id number]  Chief Complaint  Patient presents with  . Follow-up     HPI Patient is in today for follow up.  Patient reports she went to the ear specialist yesterday and is going to have to get hearing aids.  Patient has been monitoring blood pressures at home and they are still running around rh [id number] systolic and 00-93 dystolic.  No recent illness or acute concerns. Denies CP/palp/SOB/HA/congestion/fevers/GI or GU c/o. Taking meds as prescribed  Past Medical History  Diagnosis Date  . Hypothyroidism   . Osteopenia   . Diverticulosis   . Granuloma of skin     RLL  . Vitamin D deficiency   . GERD (gastroesophageal reflux disease)   . Hyperlipidemia   . Hearing loss   . Arthritis   . Depression 4/03  . Lumbar herniated disc   . Diverticulosis of colon without hemorrhage 10/07/2011  . History of chicken pox   . H/O measles   . H/O mumps   . Incontinence in female 02/06/2015  . CTS (carpal tunnel syndrome) 06/18/2015  . Nocturia 06/18/2015  . Tinnitus 07/24/2015  . Elevated blood pressure 07/24/2015  . Hypertension 07/24/2015    Past Surgical History  Procedure Laterality Date  . Tonsillectomy and adenoidectomy  1939  . Dilation and curettage of uterus  1954, 1978, 1979  . Abdominal hysterectomy  1980  . Knee surgery  2004    left knee  . Cataract extraction  2010  . Breast surgery      L breast cyst  . Appendectomy      Family History  Problem Relation Age of Onset  . Breast cancer Mother     metastatic disease  . Kidney disease Father     Bright's disease  . Cancer Maternal Grandfather     prostate  . Obesity Daughter   . Thyroid disease Son   . Hyperlipidemia Son   . Heart disease Mother     Atrial fibrillation    Social History   Social History  . Marital Status: Widowed    Spouse Name: N/A  . Number of Children: 3  . Years of Education: N/A   Occupational History    . Not on file.   Social History Main Topics  . Smoking status: Former Smoker -- 0.50 packs/day for 40 years    Types: Cigarettes    Start date: 06/09/1940    Quit date: 04/23/1979  . Smokeless tobacco: Never Used     Comment: quit smoking 40 years ago  . Alcohol Use: 0.0 oz/week    0 Standard drinks or equivalent per week     Comment: socially  . Drug Use: No  . Sexual Activity: Not Currently    Birth Control/ Protection: Post-menopausal     Comment: moved to [place], widowed. no dietary restrictions   Other Topics Concern  . Not on file   Social History Narrative    Outpatient Prescriptions Prior to Visit  Medication Sig Dispense Refill  . calcium-vitamin D (OSCAL WITH D) 500-200 MG-UNIT per tablet Take 1 tablet by mouth.    Marland Kitchen KRILL OIL PO Take by mouth daily.    Marland Kitchen levothyroxine (SYNTHROID, LEVOTHROID) 88 MCG tablet TAKE 1 TABLET BY MOUTH DAILY BEFORE BREAKFAST MONDAY THRU FRIDAY. TAKE 1/2 TABLET SATURDAY AND SUNDAY 90 tablet 1  . lisinopril (PRINIVIL,ZESTRIL) 10 MG tablet Take 1  tablet (10 mg total) by mouth daily. 30 tablet 0  . psyllium (METAMUCIL) 58.6 % packet Take 1 packet by mouth daily.     No facility-administered medications prior to visit.    Allergies  Allergen Reactions  . Latex Itching  . Lipitor [Atorvastatin Calcium] Other (See Comments)    Myalgia     Review of Systems  Constitutional: Negative for fever and malaise/fatigue.  HENT: Positive for hearing loss. Negative for congestion.   Eyes: Negative for blurred vision.  Respiratory: Negative for shortness of breath.   Cardiovascular: Negative for chest pain, palpitations and leg swelling.  Gastrointestinal: Negative for nausea, abdominal pain and blood in stool.  Genitourinary: Negative for dysuria and frequency.  Musculoskeletal: Negative for falls.  Skin: Negative for rash.  Neurological: Negative for dizziness, loss of consciousness and headaches.  Endo/Heme/Allergies: Negative for  environmental allergies.  Psychiatric/Behavioral: Negative for depression. The patient is not nervous/anxious.        Objective:    Physical Exam  Constitutional: She is oriented to person, place, and time. She appears well-developed and well-nourished. No distress.  HENT:  Head: Normocephalic and atraumatic.  Eyes: Conjunctivae are normal.  Neck: Neck supple. No thyromegaly present.  Cardiovascular: Normal rate, regular rhythm and normal heart sounds.   No murmur heard. Pulmonary/Chest: Effort normal and breath sounds normal. No respiratory distress.  Abdominal: Soft. Bowel sounds are normal. She exhibits no distension and no mass. There is no tenderness.  Musculoskeletal: She exhibits no edema.  Lymphadenopathy:    She has no cervical adenopathy.  Neurological: She is alert and oriented to person, place, and time.  Skin: Skin is warm and dry.  Psychiatric: She has a normal mood and affect. Her behavior is normal.    BP 110/60 mmHg  Pulse 76  Temp(Src) 98.2 F (36.8 C) (Oral)  Ht '5\' 5"'$  (1.651 m)  Wt 153 lb 2 oz (69.457 kg)  BMI 25.48 kg/m2  SpO2 94% Wt Readings from Last 3 Encounters:  09/08/15 153 lb 2 oz (69.457 kg)  07/24/15 157 lb 9 oz (71.47 kg)  06/14/15 154 lb (69.854 kg)     Lab Results  Component Value Date   WBC 6.4 06/09/2015   HGB 13.9 06/09/2015   HCT 40.7 06/09/2015   PLT 240.0 06/09/2015   GLUCOSE 91 06/09/2015   CHOL 197 06/09/2015   TRIG 96.0 06/09/2015   HDL 52.00 06/09/2015   LDLCALC 126* 06/09/2015   ALT 14 06/09/2015   AST 16 06/09/2015   NA 142 06/09/2015   K 4.3 06/09/2015   CL 110 06/09/2015   CREATININE 0.59 06/09/2015   BUN 16 06/09/2015   CO2 28 06/09/2015   TSH 0.40 06/09/2015    Lab Results  Component Value Date   TSH 0.40 06/09/2015   Lab Results  Component Value Date   WBC 6.4 06/09/2015   HGB 13.9 06/09/2015   HCT 40.7 06/09/2015   MCV 89.1 06/09/2015   PLT 240.0 06/09/2015   Lab Results  Component Value Date     NA 142 06/09/2015   K 4.3 06/09/2015   CO2 28 06/09/2015   GLUCOSE 91 06/09/2015   BUN 16 06/09/2015   CREATININE 0.59 06/09/2015   BILITOT 0.4 06/09/2015   ALKPHOS 55 06/09/2015   AST 16 06/09/2015   ALT 14 06/09/2015   PROT 6.5 06/09/2015   ALBUMIN 3.9 06/09/2015   CALCIUM 9.5 06/09/2015   GFR 102.03 06/09/2015   Lab Results  Component Value Date  CHOL 197 06/09/2015   Lab Results  Component Value Date   HDL 52.00 06/09/2015   Lab Results  Component Value Date   LDLCALC 126* 06/09/2015   Lab Results  Component Value Date   TRIG 96.0 06/09/2015   Lab Results  Component Value Date   CHOLHDL 4 06/09/2015   No results found for: HGBA1C     Assessment & Plan:   Problem List Items Addressed This Visit    Osteopenia  Dexa done 05/27/2014 normal     Encouraged to get adequate exercise, calcium and vitamin d intake      Hypothyroidism    On Levothyroxine, continue to monitor      Hypertension    Well controlled, no changes to meds. Encouraged heart healthy diet such as the DASH diet and exercise as tolerated.  Taking just 1/2 tab po daily       GERD (gastroesophageal reflux disease) - Primary    Avoid offending foods, take probiotics. Do not eat large meals in late evening and consider raising head of bed.          I am having Ms. Ebey maintain her calcium-vitamin D, psyllium, KRILL OIL PO, levothyroxine, and lisinopril.  No orders of the defined types were placed in this encounter.     Penni Homans, MD

## 2015-09-08 NOTE — Assessment & Plan Note (Addendum)
Avoid offending foods, take probiotics. Do not eat large meals in late evening and consider raising head of bed.  

## 2015-09-08 NOTE — Assessment & Plan Note (Signed)
On Levothyroxine, continue to monitor 

## 2015-09-08 NOTE — Progress Notes (Signed)
Pre visit review using our clinic review tool, if applicable. No additional management support is needed unless otherwise documented below in the visit note. 

## 2015-09-11 NOTE — Assessment & Plan Note (Signed)
Encouraged to get adequate exercise, calcium and vitamin d intake 

## 2015-09-20 ENCOUNTER — Encounter: Payer: Self-pay | Admitting: Family Medicine

## 2015-10-13 ENCOUNTER — Telehealth: Payer: Self-pay | Admitting: Family Medicine

## 2015-10-13 MED ORDER — LISINOPRIL 10 MG PO TABS: 10.0000 mg | ORAL_TABLET | Freq: Every day | ORAL | Status: DC

## 2015-10-13 NOTE — Telephone Encounter (Signed)
Can be reached: [phone] Pharmacy: [facility], [place] - [address]  Reason for call: Pt called for refill on lisinopril. She has 3-4 left. Please call if needed.

## 2015-10-13 NOTE — Telephone Encounter (Signed)
Prescription sent in and patient notified.

## 2015-11-09 ENCOUNTER — Encounter: Payer: Self-pay | Admitting: Family Medicine

## 2015-11-09 ENCOUNTER — Ambulatory Visit (INDEPENDENT_AMBULATORY_CARE_PROVIDER_SITE_OTHER): Payer: Medicare Other | Admitting: Family Medicine

## 2015-11-09 VITALS — BP 130/78 | HR 48 | Temp 97.9°F | Ht 65.0 in | Wt 152.0 lb

## 2015-11-09 DIAGNOSIS — E039 Hypothyroidism, unspecified: Secondary | ICD-10-CM

## 2015-11-09 DIAGNOSIS — I10 Essential (primary) hypertension: Secondary | ICD-10-CM | POA: Diagnosis not present

## 2015-11-09 DIAGNOSIS — K219 Gastro-esophageal reflux disease without esophagitis: Secondary | ICD-10-CM

## 2015-11-09 DIAGNOSIS — I25810 Atherosclerosis of coronary artery bypass graft(s) without angina pectoris: Secondary | ICD-10-CM

## 2015-11-09 DIAGNOSIS — R001 Bradycardia, unspecified: Secondary | ICD-10-CM

## 2015-11-09 DIAGNOSIS — E559 Vitamin D deficiency, unspecified: Secondary | ICD-10-CM

## 2015-11-09 DIAGNOSIS — M8580 Other specified disorders of bone density and structure, unspecified site: Secondary | ICD-10-CM

## 2015-11-09 DIAGNOSIS — E782 Mixed hyperlipidemia: Secondary | ICD-10-CM | POA: Diagnosis not present

## 2015-11-09 LAB — COMPREHENSIVE METABOLIC PANEL
ALK PHOS: 62 U/L (ref 39–117)
ALT: 11 U/L (ref 0–35)
AST: 15 U/L (ref 0–37)
Albumin: 4.2 g/dL (ref 3.5–5.2)
BILIRUBIN TOTAL: 0.6 mg/dL (ref 0.2–1.2)
BUN: 15 mg/dL (ref 6–23)
CO2: 29 mEq/L (ref 19–32)
CREATININE: 0.68 mg/dL (ref 0.40–1.20)
Calcium: 10.5 mg/dL (ref 8.4–10.5)
Chloride: 102 mEq/L (ref 96–112)
GFR: 86.53 mL/min (ref >60.00)
GLUCOSE: 89 mg/dL (ref 70–99)
Potassium: 4.3 mEq/L (ref 3.5–5.1)
SODIUM: 138 meq/L (ref 135–145)
TOTAL PROTEIN: 7.1 g/dL (ref 6.0–8.3)

## 2015-11-09 LAB — CBC
HCT: 42.5 % (ref 36.0–46.0)
Hemoglobin: 14.2 g/dL (ref 12.0–15.0)
MCHC: 33.3 g/dL (ref 30.0–36.0)
MCV: 89.7 fl (ref 78.0–100.0)
Platelets: 237 10*3/uL (ref 150.0–400.0)
RBC: 4.74 Mil/uL (ref 3.87–5.11)
RDW: 14.2 % (ref 11.5–15.5)
WBC: 6.6 10*3/uL (ref 4.0–10.5)

## 2015-11-09 LAB — VITAMIN D 25 HYDROXY (VIT D DEFICIENCY, FRACTURES): VITD: 38.43 ng/mL (ref 30.00–100.00)

## 2015-11-09 LAB — TSH: TSH: 0.82 u[IU]/mL (ref 0.35–4.50)

## 2015-11-09 MED ORDER — LEVOTHYROXINE SODIUM 75 MCG PO TABS: 75.0000 ug | ORAL_TABLET | Freq: Every day | ORAL | Status: DC

## 2015-11-09 MED ORDER — LISINOPRIL 10 MG PO TABS: 5.0000 mg | ORAL_TABLET | Freq: Every day | ORAL | Status: DC

## 2015-11-09 NOTE — Assessment & Plan Note (Signed)
Encouraged heart healthy diet, increase exercise, avoid trans fats, consider a krill oil cap daily 

## 2015-11-09 NOTE — Patient Instructions (Addendum)
Hypertension Hypertension, commonly called high blood pressure, is when the force of blood pumping through your arteries is too strong. Your arteries are the blood vessels that carry blood from your heart throughout your body. A blood pressure reading consists of a higher number over a lower number, such as 110/72. The higher number (systolic) is the pressure inside your arteries when your heart pumps. The lower number (diastolic) is the pressure inside your arteries when your heart relaxes. Ideally you want your blood pressure below 120/80. Hypertension forces your heart to work harder to pump blood. Your arteries may become narrow or stiff. Having untreated or uncontrolled hypertension can cause heart attack, stroke, kidney disease, and other problems. RISK FACTORS Some risk factors for high blood pressure are controllable. Others are not.  Risk factors you cannot control include:   Race. You may be at higher risk if you are African American.  Age. Risk increases with age.  Gender. Men are at higher risk than women before age 45 years. After age 65, women are at higher risk than men. Risk factors you can control include:  Not getting enough exercise or physical activity.  Being overweight.  Getting too much fat, sugar, calories, or salt in your diet.  Drinking too much alcohol. SIGNS AND SYMPTOMS Hypertension does not usually cause signs or symptoms. Extremely high blood pressure (hypertensive crisis) may cause headache, anxiety, shortness of breath, and nosebleed. DIAGNOSIS To check if you have hypertension, your health care provider will measure your blood pressure while you are seated, with your arm held at the level of your heart. It should be measured at least twice using the same arm. Certain conditions can cause a difference in blood pressure between your right and left arms. A blood pressure reading that is higher than normal on one occasion does not mean that you need treatment. If  it is not clear whether you have high blood pressure, you may be asked to return on a different day to have your blood pressure checked again. Or, you may be asked to monitor your blood pressure at home for 1 or more weeks. TREATMENT Treating high blood pressure includes making lifestyle changes and possibly taking medicine. Living a healthy lifestyle can help lower high blood pressure. You may need to change some of your habits. Lifestyle changes may include:  Following the DASH diet. This diet is high in fruits, vegetables, and whole grains. It is low in salt, red meat, and added sugars.  Keep your sodium intake below 2,300 mg per day.  Getting at least 30-45 minutes of aerobic exercise at least 4 times per week.  Losing weight if necessary.  Not smoking.  Limiting alcoholic beverages.  Learning ways to reduce stress. Your health care provider may prescribe medicine if lifestyle changes are not enough to get your blood pressure under control, and if one of the following is true:  You are 18-59 years of age and your systolic blood pressure is above 140.  You are 60 years of age or older, and your systolic blood pressure is above 150.  Your diastolic blood pressure is above 90.  You have diabetes, and your systolic blood pressure is over 140 or your diastolic blood pressure is over 90.  You have kidney disease and your blood pressure is above 140/90.  You have heart disease and your blood pressure is above 140/90. Your personal target blood pressure may vary depending on your medical conditions, your age, and other factors. HOME CARE INSTRUCTIONS    Have your blood pressure rechecked as directed by your health care provider.   Take medicines only as directed by your health care provider. Follow the directions carefully. Blood pressure medicines must be taken as prescribed. The medicine does not work as well when you skip doses. Skipping doses also puts you at risk for  problems.  Do not smoke.   Monitor your blood pressure at home as directed by your health care provider. SEEK MEDICAL CARE IF:   You think you are having a reaction to medicines taken.  You have recurrent headaches or feel dizzy.  You have swelling in your ankles.  You have trouble with your vision. SEEK IMMEDIATE MEDICAL CARE IF:  You develop a severe headache or confusion.  You have unusual weakness, numbness, or feel faint.  You have severe chest or abdominal pain.  You vomit repeatedly.  You have trouble breathing. MAKE SURE YOU:   Understand these instructions.  Will watch your condition.  Will get help right away if you are not doing well or get worse.   This information is not intended to replace advice given to you by your health care provider. Make sure you discuss any questions you have with your health care provider.   Document Released: 04/08/2005 Document Revised: 08/23/2014 Document Reviewed: 01/29/2013 Elsevier Interactive Patient Education 2016 Elsevier Inc.  

## 2015-11-09 NOTE — Assessment & Plan Note (Signed)
Well controlled today is taking meds inconsistently, no changes to meds. Encouraged heart healthy diet such as the DASH diet and exercise as tolerated.

## 2015-11-09 NOTE — Assessment & Plan Note (Signed)
On Levothyroxine, continue to monitor 

## 2015-11-09 NOTE — Progress Notes (Signed)
Pre visit review using our clinic review tool, if applicable. No additional management support is needed unless otherwise documented below in the visit note. 

## 2015-11-28 NOTE — Progress Notes (Signed)
Patient ID: Kylie Walters, female   DOB: 07-Jul-1924, 82 y.o.   MRN: [id number]   Subjective:    Patient ID: Kylie Walters, female    DOB: 04/21/1925, 82 y.o.   MRN: [id number]  Chief Complaint  Patient presents with  . Follow-up    HPI Patient is in today for follow up. Feels well most of the time. Does note some episodes of palpitations and SOB but they are short lived and improve when she calms down. No recent illness or hospitalization. Denies CP/HA/congestion/fevers/GI or GU c/o. Taking meds as prescribed  Past Medical History:  Diagnosis Date  . Arthritis   . CTS (carpal tunnel syndrome) 06/18/2015  . Depression 4/03  . Diverticulosis   . Diverticulosis of colon without hemorrhage 10/07/2011  . Elevated blood pressure 07/24/2015  . GERD (gastroesophageal reflux disease)   . Granuloma of skin    RLL  . H/O measles   . H/O mumps   . Hearing loss   . History of chicken pox   . Hyperlipidemia   . Hypertension 07/24/2015  . Hypothyroidism   . Incontinence in female 02/06/2015  . Lumbar herniated disc   . Nocturia 06/18/2015  . Osteopenia   . Tinnitus 07/24/2015  . Vitamin D deficiency     Past Surgical History:  Procedure Laterality Date  . ABDOMINAL HYSTERECTOMY  1980  . APPENDECTOMY    . BREAST SURGERY     L breast cyst  . CATARACT EXTRACTION  2010  . [place]  . KNEE SURGERY  2004   left knee  . TONSILLECTOMY AND ADENOIDECTOMY  1939    Family History  Problem Relation Age of Onset  . Breast cancer Mother     metastatic disease  . Kidney disease Father     Bright's disease  . Cancer Maternal Grandfather     prostate  . Obesity Daughter   . Thyroid disease Son   . Hyperlipidemia Son   . Heart disease Mother     Atrial fibrillation    Social History   Social History  . Marital status: Widowed    Spouse name: N/A  . Number of children: 3  . Years of education: N/A   Occupational History  . Not on file.    Social History Main Topics  . Smoking status: Former Smoker    Packs/day: 0.50    Years: 40.00    Types: Cigarettes    Start date: 06/09/1940    Quit date: 04/23/1979  . Smokeless tobacco: Never Used     Comment: quit smoking 40 years ago  . Alcohol use 0.0 oz/week     Comment: socially  . Drug use: No  . Sexual activity: Not Currently    Birth control/ protection: Post-menopausal     Comment: moved to [facility], widowed. no dietary restrictions   Other Topics Concern  . Not on file   Social History Narrative  . No narrative on file    Outpatient Medications Prior to Visit  Medication Sig Dispense Refill  . calcium-vitamin D (OSCAL WITH D) 500-200 MG-UNIT per tablet Take 1 tablet by mouth.    Marland Kitchen KRILL OIL PO Take by mouth daily.    . psyllium (METAMUCIL) 58.6 % packet Take 1 packet by mouth daily.    Marland Kitchen levothyroxine (SYNTHROID, LEVOTHROID) 88 MCG tablet TAKE 1 TABLET BY MOUTH DAILY BEFORE BREAKFAST MONDAY THRU FRIDAY. TAKE 1/2 TABLET SATURDAY AND SUNDAY 90 tablet  1  . lisinopril (PRINIVIL,ZESTRIL) 10 MG tablet Take 1 tablet (10 mg total) by mouth daily. 30 tablet 6   No facility-administered medications prior to visit.     Allergies  Allergen Reactions  . Latex Itching  . Lipitor [Atorvastatin Calcium] Other (See Comments)    Myalgia     Review of Systems  Constitutional: Negative for fever and malaise/fatigue.  HENT: Negative for congestion.   Eyes: Negative for blurred vision.  Respiratory: Positive for shortness of breath.   Cardiovascular: Negative for chest pain, palpitations and leg swelling.  Gastrointestinal: Negative for abdominal pain, blood in stool and nausea.  Genitourinary: Negative for dysuria and frequency.  Musculoskeletal: Negative for falls.  Skin: Negative for rash.  Neurological: Negative for dizziness, loss of consciousness and headaches.  Endo/Heme/Allergies: Negative for environmental allergies.  Psychiatric/Behavioral: Negative for  depression. The patient is not nervous/anxious.        Objective:    Physical Exam  Constitutional: She is oriented to person, place, and time. She appears well-developed and well-nourished. No distress.  HENT:  Head: Normocephalic and atraumatic.  Nose: Nose normal.  Eyes: Right eye exhibits no discharge. Left eye exhibits no discharge.  Neck: Normal range of motion. Neck supple.  Cardiovascular: Normal rate and regular rhythm.   No murmur heard. Pulmonary/Chest: Effort normal and breath sounds normal.  Abdominal: Soft. Bowel sounds are normal. There is no tenderness.  Musculoskeletal: She exhibits no edema.  Neurological: She is alert and oriented to person, place, and time.  Skin: Skin is warm and dry.  Psychiatric: She has a normal mood and affect.  Nursing note and vitals reviewed.   BP 130/78   Pulse (!) 48   Temp 97.9 F (36.6 C) (Oral)   Ht '5\' 5"'$  (1.651 m)   Wt 152 lb (68.9 kg)   SpO2 97%   BMI 25.29 kg/m  Wt Readings from Last 3 Encounters:  11/09/15 152 lb (68.9 kg)  09/08/15 153 lb 2 oz (69.5 kg)  07/24/15 157 lb 9 oz (71.5 kg)     Lab Results  Component Value Date   WBC 6.6 11/09/2015   HGB 14.2 11/09/2015   HCT 42.5 11/09/2015   PLT 237.0 11/09/2015   GLUCOSE 89 11/09/2015   CHOL 197 06/09/2015   TRIG 96.0 06/09/2015   HDL 52.00 06/09/2015   LDLCALC 126 (H) 06/09/2015   ALT 11 11/09/2015   AST 15 11/09/2015   NA 138 11/09/2015   K 4.3 11/09/2015   CL 102 11/09/2015   CREATININE 0.68 11/09/2015   BUN 15 11/09/2015   CO2 29 11/09/2015   TSH 0.82 11/09/2015    Lab Results  Component Value Date   TSH 0.82 11/09/2015   Lab Results  Component Value Date   WBC 6.6 11/09/2015   HGB 14.2 11/09/2015   HCT 42.5 11/09/2015   MCV 89.7 11/09/2015   PLT 237.0 11/09/2015   Lab Results  Component Value Date   NA 138 11/09/2015   K 4.3 11/09/2015   CO2 29 11/09/2015   GLUCOSE 89 11/09/2015   BUN 15 11/09/2015   CREATININE 0.68 11/09/2015    BILITOT 0.6 11/09/2015   ALKPHOS 62 11/09/2015   AST 15 11/09/2015   ALT 11 11/09/2015   PROT 7.1 11/09/2015   ALBUMIN 4.2 11/09/2015   CALCIUM 10.5 11/09/2015   GFR 86.53 11/09/2015   Lab Results  Component Value Date   CHOL 197 06/09/2015   Lab Results  Component Value Date  HDL 52.00 06/09/2015   Lab Results  Component Value Date   LDLCALC 126 (H) 06/09/2015   Lab Results  Component Value Date   TRIG 96.0 06/09/2015   Lab Results  Component Value Date   CHOLHDL 4 06/09/2015   No results found for: HGBA1C     Assessment & Plan:   Problem List Items Addressed This Visit    Hypothyroidism - Primary    On Levothyroxine, continue to monitor      Relevant Medications   levothyroxine (SYNTHROID, LEVOTHROID) 75 MCG tablet   Other Relevant Orders   TSH (Completed)   CBC (Completed)   Comprehensive metabolic panel (Completed)   Vitamin D (25 hydroxy) (Completed)   Osteopenia  Dexa done 05/27/2014 normal     Encouraged to get adequate exercise, calcium and vitamin d intake      Vitamin D deficiency   Relevant Orders   TSH (Completed)   CBC (Completed)   Comprehensive metabolic panel (Completed)   Vitamin D (25 hydroxy) (Completed)   GERD (gastroesophageal reflux disease)    Avoid offending foods, start probiotics. Do not eat large meals in late evening and consider raising head of bed.       Relevant Orders   TSH (Completed)   CBC (Completed)   Comprehensive metabolic panel (Completed)   Vitamin D (25 hydroxy) (Completed)   Sinus bradycardia on ECG   Relevant Orders   TSH (Completed)   CBC (Completed)   Comprehensive metabolic panel (Completed)   Vitamin D (25 hydroxy) (Completed)   Hyperlipidemia, mixed    Encouraged heart healthy diet, increase exercise, avoid trans fats, consider a krill oil cap daily      Relevant Medications   lisinopril (PRINIVIL,ZESTRIL) 10 MG tablet   Other Relevant Orders   TSH (Completed)   CBC (Completed)    Comprehensive metabolic panel (Completed)   Vitamin D (25 hydroxy) (Completed)   Hypertension    Well controlled today is taking meds inconsistently, no changes to meds. Encouraged heart healthy diet such as the DASH diet and exercise as tolerated.       Relevant Medications   lisinopril (PRINIVIL,ZESTRIL) 10 MG tablet   Other Relevant Orders   TSH (Completed)   CBC (Completed)   Comprehensive metabolic panel (Completed)   Vitamin D (25 hydroxy) (Completed)    Other Visit Diagnoses   None.     I have discontinued Ms. Jefcoat's levothyroxine. I have also changed her lisinopril. Additionally, I am having her start on levothyroxine. Lastly, I am having her maintain her calcium-vitamin D, psyllium, and KRILL OIL PO.  Meds ordered this encounter  Medications  . lisinopril (PRINIVIL,ZESTRIL) 10 MG tablet    Sig: Take 0.5 tablets (5 mg total) by mouth daily.    Dispense:  15 tablet    Refill:  5  . levothyroxine (SYNTHROID, LEVOTHROID) 75 MCG tablet    Sig: Take 1 tablet (75 mcg total) by mouth daily.    Dispense:  30 tablet    Refill:  5     Penni Homans, MD

## 2015-11-28 NOTE — Assessment & Plan Note (Signed)
Avoid offending foods, start probiotics. Do not eat large meals in late evening and consider raising head of bed.  

## 2015-11-28 NOTE — Assessment & Plan Note (Signed)
Encouraged to get adequate exercise, calcium and vitamin d intake 

## 2016-01-04 ENCOUNTER — Encounter: Payer: Self-pay | Admitting: Family Medicine

## 2016-01-04 ENCOUNTER — Ambulatory Visit (INDEPENDENT_AMBULATORY_CARE_PROVIDER_SITE_OTHER): Payer: Medicare Other | Admitting: Family Medicine

## 2016-01-04 DIAGNOSIS — Z23 Encounter for immunization: Secondary | ICD-10-CM

## 2016-01-04 DIAGNOSIS — E782 Mixed hyperlipidemia: Secondary | ICD-10-CM

## 2016-01-04 DIAGNOSIS — E559 Vitamin D deficiency, unspecified: Secondary | ICD-10-CM

## 2016-01-04 DIAGNOSIS — E039 Hypothyroidism, unspecified: Secondary | ICD-10-CM

## 2016-01-04 DIAGNOSIS — I10 Essential (primary) hypertension: Secondary | ICD-10-CM | POA: Diagnosis not present

## 2016-01-04 DIAGNOSIS — I25810 Atherosclerosis of coronary artery bypass graft(s) without angina pectoris: Secondary | ICD-10-CM

## 2016-01-04 NOTE — Assessment & Plan Note (Signed)
Recently saw audiology and they removed some wax and fitted her for hearing aides. She did not do well with them and caused her some discomfort so she chose not to proceed with them yet

## 2016-01-04 NOTE — Assessment & Plan Note (Signed)
Encouraged heart healthy diet, increase exercise, avoid trans fats, consider a krill oil cap daily 

## 2016-01-04 NOTE — Assessment & Plan Note (Signed)
On Levothyroxine, continue to monitor 

## 2016-01-04 NOTE — Progress Notes (Signed)
Pre visit review using our clinic review tool, if applicable. No additional management support is needed unless otherwise documented below in the visit note. 

## 2016-01-04 NOTE — Assessment & Plan Note (Signed)
Well controlled, no changes to meds. Encouraged heart healthy diet such as the DASH diet and exercise as tolerated.  °

## 2016-01-04 NOTE — Progress Notes (Signed)
Patient ID: Kylie Walters, female   DOB: 1924-11-27, 82 y.o.   MRN: [id number]   Subjective:    Patient ID: Kylie Walters, female    DOB: 03-28-1925, 82 y.o.   MRN: [id number]  Chief Complaint  Patient presents with  . Follow-up    HPI Patient is in today for follow up. No recent illness or hospitalization. No acute concerns. Did have one fall recently when she got caught up in a blanket as she got out of her recliner but she did not suffer any injury or new complaints. BP has been well controlled at home with Sys bp or 120 to 500 and diastolics generally in the 70s. Denies CP/palp/SOB/HA/congestion/fevers/GI or GU c/o. Taking meds as prescribed  Past Medical History:  Diagnosis Date  . Arthritis   . CTS (carpal tunnel syndrome) 06/18/2015  . Depression 4/03  . Diverticulosis   . Diverticulosis of colon without hemorrhage 10/07/2011  . Elevated blood pressure 07/24/2015  . GERD (gastroesophageal reflux disease)   . Granuloma of skin    RLL  . H/O measles   . H/O mumps   . Hearing loss   . History of chicken pox   . Hyperlipidemia   . Hypertension 07/24/2015  . Hypothyroidism   . Incontinence in female 02/06/2015  . Lumbar herniated disc   . Nocturia 06/18/2015  . Osteopenia   . Tinnitus 07/24/2015  . Vitamin D deficiency     Past Surgical History:  Procedure Laterality Date  . ABDOMINAL HYSTERECTOMY  1980  . APPENDECTOMY    . BREAST SURGERY     L breast cyst  . CATARACT EXTRACTION  2010  . [place]  . KNEE SURGERY  2004   left knee  . TONSILLECTOMY AND ADENOIDECTOMY  1939    Family History  Problem Relation Age of Onset  . Breast cancer Mother     metastatic disease  . Kidney disease Father     Bright's disease  . Cancer Maternal Grandfather     prostate  . Obesity Daughter   . Thyroid disease Son   . Hyperlipidemia Son   . Heart disease Mother     Atrial fibrillation    Social History   Social History  . Marital  status: Widowed    Spouse name: N/A  . Number of children: 3  . Years of education: N/A   Occupational History  . Not on file.   Social History Main Topics  . Smoking status: Former Smoker    Packs/day: 0.50    Years: 40.00    Types: Cigarettes    Start date: 06/09/1940    Quit date: 04/23/1979  . Smokeless tobacco: Never Used     Comment: quit smoking 40 years ago  . Alcohol use 0.0 oz/week     Comment: socially  . Drug use: No  . Sexual activity: Not Currently    Birth control/ protection: Post-menopausal     Comment: moved to [facility], widowed. no dietary restrictions   Other Topics Concern  . Not on file   Social History Narrative  . No narrative on file    Outpatient Medications Prior to Visit  Medication Sig Dispense Refill  . calcium-vitamin D (OSCAL WITH D) 500-200 MG-UNIT per tablet Take 1 tablet by mouth.    Marland Kitchen KRILL OIL PO Take by mouth daily.    Marland Kitchen levothyroxine (SYNTHROID, LEVOTHROID) 75 MCG tablet Take 1 tablet (75 mcg total)  by mouth daily. 30 tablet 5  . lisinopril (PRINIVIL,ZESTRIL) 10 MG tablet Take 0.5 tablets (5 mg total) by mouth daily. 15 tablet 5  . psyllium (METAMUCIL) 58.6 % packet Take 1 packet by mouth daily.     No facility-administered medications prior to visit.     Allergies  Allergen Reactions  . Latex Itching  . Lipitor [Atorvastatin Calcium] Other (See Comments)    Myalgia     Review of Systems  Constitutional: Positive for malaise/fatigue. Negative for fever.  HENT: Positive for hearing loss. Negative for congestion.   Eyes: Negative for blurred vision.  Respiratory: Negative for shortness of breath.   Cardiovascular: Negative for chest pain, palpitations and leg swelling.  Gastrointestinal: Negative for abdominal pain, blood in stool and nausea.  Genitourinary: Negative for dysuria and frequency.  Musculoskeletal: Negative for falls.  Skin: Negative for rash.  Neurological: Negative for dizziness, loss of consciousness and  headaches.  Endo/Heme/Allergies: Negative for environmental allergies.  Psychiatric/Behavioral: Negative for depression. The patient is not nervous/anxious.        Objective:    Physical Exam  Constitutional: She is oriented to person, place, and time. She appears well-developed and well-nourished. No distress.  HENT:  Head: Normocephalic and atraumatic.  Nose: Nose normal.  Eyes: Right eye exhibits no discharge. Left eye exhibits no discharge.  Neck: Normal range of motion. Neck supple.  Cardiovascular: Normal rate and regular rhythm.   No murmur heard. Pulmonary/Chest: Effort normal and breath sounds normal.  Abdominal: Soft. Bowel sounds are normal. There is no tenderness.  Musculoskeletal: She exhibits no edema.  Neurological: She is alert and oriented to person, place, and time.  Skin: Skin is warm and dry.  Psychiatric: She has a normal mood and affect.  Nursing note and vitals reviewed.   BP 136/80 (BP Location: Left Arm, Patient Position: Sitting, Cuff Size: Normal)   Pulse (!) 51   Temp 97.8 F (36.6 C) (Oral)   Ht '5\' 5"'$  (1.651 m)   Wt 156 lb (70.8 kg)   SpO2 96%   BMI 25.96 kg/m  Wt Readings from Last 3 Encounters:  01/04/16 156 lb (70.8 kg)  11/09/15 152 lb (68.9 kg)  09/08/15 153 lb 2 oz (69.5 kg)     Lab Results  Component Value Date   WBC 6.6 11/09/2015   HGB 14.2 11/09/2015   HCT 42.5 11/09/2015   PLT 237.0 11/09/2015   GLUCOSE 89 11/09/2015   CHOL 197 06/09/2015   TRIG 96.0 06/09/2015   HDL 52.00 06/09/2015   LDLCALC 126 (H) 06/09/2015   ALT 11 11/09/2015   AST 15 11/09/2015   NA 138 11/09/2015   K 4.3 11/09/2015   CL 102 11/09/2015   CREATININE 0.68 11/09/2015   BUN 15 11/09/2015   CO2 29 11/09/2015   TSH 0.82 11/09/2015    Lab Results  Component Value Date   TSH 0.82 11/09/2015   Lab Results  Component Value Date   WBC 6.6 11/09/2015   HGB 14.2 11/09/2015   HCT 42.5 11/09/2015   MCV 89.7 11/09/2015   PLT 237.0 11/09/2015    Lab Results  Component Value Date   NA 138 11/09/2015   K 4.3 11/09/2015   CO2 29 11/09/2015   GLUCOSE 89 11/09/2015   BUN 15 11/09/2015   CREATININE 0.68 11/09/2015   BILITOT 0.6 11/09/2015   ALKPHOS 62 11/09/2015   AST 15 11/09/2015   ALT 11 11/09/2015   PROT 7.1 11/09/2015   ALBUMIN 4.2  11/09/2015   CALCIUM 10.5 11/09/2015   GFR 86.53 11/09/2015   Lab Results  Component Value Date   CHOL 197 06/09/2015   Lab Results  Component Value Date   HDL 52.00 06/09/2015   Lab Results  Component Value Date   LDLCALC 126 (H) 06/09/2015   Lab Results  Component Value Date   TRIG 96.0 06/09/2015   Lab Results  Component Value Date   CHOLHDL 4 06/09/2015   No results found for: HGBA1C     Assessment & Plan:   Problem List Items Addressed This Visit    Hypothyroidism    On Levothyroxine, continue to monitor      Relevant Orders   TSH   Vitamin D deficiency    Daily supplements and monitor      Hyperlipidemia, mixed    Encouraged heart healthy diet, increase exercise, avoid trans fats, consider a krill oil cap daily      Relevant Medications   aspirin EC 81 MG tablet   Other Relevant Orders   Lipid panel   Hypertension    Well controlled, no changes to meds. Encouraged heart healthy diet such as the DASH diet and exercise as tolerated.       Relevant Medications   aspirin EC 81 MG tablet   Other Relevant Orders   CBC   Comprehensive metabolic panel    Other Visit Diagnoses   None.     I am having Kylie Walters maintain her calcium-vitamin D, psyllium, KRILL OIL PO, lisinopril, levothyroxine, and aspirin EC.  Meds ordered this encounter  Medications  . aspirin EC 81 MG tablet    Sig: Take 81 mg by mouth daily.     Penni Homans, MD

## 2016-01-04 NOTE — Patient Instructions (Signed)

## 2016-01-04 NOTE — Assessment & Plan Note (Signed)
Avoid offending foods, start probiotics. Do not eat large meals in late evening and consider raising head of bed.  

## 2016-01-14 NOTE — Assessment & Plan Note (Addendum)
Daily supplements and monitor 

## 2016-01-26 DIAGNOSIS — B351 Tinea unguium: Secondary | ICD-10-CM | POA: Diagnosis not present

## 2016-01-26 DIAGNOSIS — M79675 Pain in left toe(s): Secondary | ICD-10-CM | POA: Diagnosis not present

## 2016-02-09 ENCOUNTER — Telehealth: Payer: Self-pay | Admitting: Family Medicine

## 2016-02-09 NOTE — Telephone Encounter (Signed)
Caller name: Cora Relationship to patient: self Can be reached: [phone] Pharmacy: [place],  - [address]  Reason for call: Pt called stating she has congestion and sinus trouble. She went to the clinic at [facility]. Low temp of 100. Upper airway congestion. Lungs clear. Lots of sinus drainage. Has headache. She is wanting to know if a cough medicine and/or anything else can be sent in for her. Pt requesting call today.

## 2016-02-12 NOTE — Telephone Encounter (Signed)
She can take Mucinex bid, vitamin C 500 mg daily, elderberry and can give Hydromet 1 tsp po bid prn cough disp #120

## 2016-02-13 MED ORDER — HYDROCODONE-HOMATROPINE 5-1.5 MG/5ML PO SYRP: 5.0000 mL | ORAL_SOLUTION | Freq: Four times a day (QID) | ORAL | 0 refills | Status: DC | PRN

## 2016-02-13 NOTE — Telephone Encounter (Signed)
Patient informed of PCP instructions. Printed prescription and put at the front desk.

## 2016-03-06 ENCOUNTER — Encounter (HOSPITAL_BASED_OUTPATIENT_CLINIC_OR_DEPARTMENT_OTHER): Payer: Self-pay | Admitting: Emergency Medicine

## 2016-03-06 ENCOUNTER — Emergency Department (HOSPITAL_BASED_OUTPATIENT_CLINIC_OR_DEPARTMENT_OTHER)
Admission: EM | Admit: 2016-03-06 | Discharge: 2016-03-06 | Disposition: A | Discharge status: Home / Self Care | Payer: Medicare Other | Attending: Emergency Medicine | Admitting: Emergency Medicine

## 2016-03-06 ENCOUNTER — Emergency Department (HOSPITAL_BASED_OUTPATIENT_CLINIC_OR_DEPARTMENT_OTHER): Payer: Medicare Other

## 2016-03-06 DIAGNOSIS — Y929 Unspecified place or not applicable: Secondary | ICD-10-CM | POA: Insufficient documentation

## 2016-03-06 DIAGNOSIS — Z87891 Personal history of nicotine dependence: Secondary | ICD-10-CM | POA: Insufficient documentation

## 2016-03-06 DIAGNOSIS — S2241XA Multiple fractures of ribs, right side, initial encounter for closed fracture: Secondary | ICD-10-CM | POA: Insufficient documentation

## 2016-03-06 DIAGNOSIS — Z79899 Other long term (current) drug therapy: Secondary | ICD-10-CM | POA: Insufficient documentation

## 2016-03-06 DIAGNOSIS — I10 Essential (primary) hypertension: Secondary | ICD-10-CM | POA: Insufficient documentation

## 2016-03-06 DIAGNOSIS — Z7982 Long term (current) use of aspirin: Secondary | ICD-10-CM | POA: Diagnosis not present

## 2016-03-06 DIAGNOSIS — S299XXA Unspecified injury of thorax, initial encounter: Secondary | ICD-10-CM | POA: Diagnosis present

## 2016-03-06 DIAGNOSIS — W19XXXA Unspecified fall, initial encounter: Secondary | ICD-10-CM | POA: Diagnosis not present

## 2016-03-06 DIAGNOSIS — E039 Hypothyroidism, unspecified: Secondary | ICD-10-CM | POA: Insufficient documentation

## 2016-03-06 DIAGNOSIS — Y939 Activity, unspecified: Secondary | ICD-10-CM | POA: Diagnosis not present

## 2016-03-06 DIAGNOSIS — Z91040 Latex allergy status: Secondary | ICD-10-CM | POA: Diagnosis not present

## 2016-03-06 DIAGNOSIS — Y999 Unspecified external cause status: Secondary | ICD-10-CM | POA: Diagnosis not present

## 2016-03-06 MED ORDER — OXYCODONE HCL 5 MG PO TABS: 2.5000 mg | ORAL_TABLET | ORAL | 0 refills | Status: DC | PRN

## 2016-03-06 MED FILL — oxyCODONE HCL 5 MG TABS: 5 | 4 days supply | Qty: 10 | Fill #0

## 2016-03-06 NOTE — ED Triage Notes (Signed)
Pt states she sneezed and noticed her right ribs hurt three days ago.  Pt states she did fall 10 days ago but wasn't hurting at that time.  No SOB.

## 2016-03-06 NOTE — Discharge Instructions (Signed)
Take tylenol '1000mg'$  4 times a day. Use your incentive spirometer every 15 min per hour that you are awake, return for fever, sob.

## 2016-03-06 NOTE — ED Notes (Signed)
Patient transported to X-ray 

## 2016-03-06 NOTE — ED Provider Notes (Signed)
[place] DEPT [facility] Provider Note   CSN: [id number] Arrival date & time: 03/06/16  1004     History   Chief Complaint Chief Complaint  Patient presents with  . Chest Pain    HPI Kylie Walters is a 82 y.o. female.  82 yo F with a chief complaint of right chest wall pain. The patient fell about 10 days ago. Sounds mechanical in nature. Since then the patient has had pain to that area. Worse with coughing and deep breathing. Denies fevers or chills denies shortness of breath. She had an injury to her right arm but she is not concerned about that. Denies any head injury or loss of consciousness. She's been taking aspirin for pain with some improvement.   The history is provided by the patient.  Chest Pain   This is a new problem. The current episode started more than 1 week ago. The problem occurs constantly. The problem has not changed since onset.The pain is associated with movement, raising an arm, breathing and coughing. The pain is present in the lateral region. The pain is at a severity of 6/10. The pain is moderate. The quality of the pain is described as brief. The pain does not radiate. Duration of episode(s) is 2 weeks. The symptoms are aggravated by certain positions. Pertinent negatives include no dizziness, no fever, no headaches, no nausea, no palpitations, no shortness of breath and no vomiting. She has tried nothing for the symptoms. The treatment provided no relief.    Past Medical History:  Diagnosis Date  . Arthritis   . CTS (carpal tunnel syndrome) 06/18/2015  . Depression 4/03  . Diverticulosis   . Diverticulosis of colon without hemorrhage 10/07/2011  . Elevated blood pressure 07/24/2015  . GERD (gastroesophageal reflux disease)   . Granuloma of skin    RLL  . H/O measles   . H/O mumps   . Hearing loss   . History of chicken pox   . Hyperlipidemia   . Hypertension 07/24/2015  . Hypothyroidism   . Incontinence in female 02/06/2015  . Lumbar herniated  disc   . Nocturia 06/18/2015  . Osteopenia   . Tinnitus 07/24/2015  . Vitamin D deficiency     Patient Active Problem List   Diagnosis Date Noted  . Tinnitus 07/24/2015  . Hypertension 07/24/2015  . CTS (carpal tunnel syndrome) 06/18/2015  . Nocturia 06/18/2015  . Dyspnea 06/14/2015  . Incontinence in female 02/06/2015  . History of chicken pox   . Hyperlipidemia, mixed 05/24/2014  . Osteoarthritis 05/29/2013  . 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 Tobacco use disorder  Quit smoking 35 years ago  11/16/2012  . Calcified granuloma of lung ([place]) 11/16/2012  . Sinus bradycardia on ECG 11/16/2012  . TMJ arthralgia 03/10/2012  . Hypothyroidism 10/07/2011  . Osteopenia  Dexa done 05/27/2014 normal  10/07/2011  . Diverticulosis of colon without hemorrhage 10/07/2011  . Vitamin D deficiency 10/07/2011  . GERD (gastroesophageal reflux disease) 10/07/2011  . Hearing loss 10/07/2011  . DJD (degenerative joint disease) 10/07/2011  . History of depression 10/07/2011  . Disc herniation 10/07/2011  . H/O hysterectomy with oophorectomy 10/07/2011  . Family history of breast cancer in first degree relative 10/07/2011  . Thyroid cyst 10/07/2011    Past Surgical History:  Procedure Laterality Date  . ABDOMINAL HYSTERECTOMY  1980  . APPENDECTOMY    . BREAST SURGERY     L breast cyst  . CATARACT EXTRACTION  2010  . DILATION AND  [place]  . KNEE SURGERY  2004   left knee  . TONSILLECTOMY AND ADENOIDECTOMY  1939    OB History    Gravida Para Term Preterm AB Living   '7 3     4     '$ SAB TAB Ectopic Multiple Live Births   4               Home Medications    Prior to Admission medications   Medication Sig Start Date End Date Taking? Authorizing Provider  aspirin EC 81 MG tablet Take 81 mg by mouth daily.    Historical Provider, MD  calcium-vitamin D (OSCAL WITH D) 500-200 MG-UNIT per tablet Take 1 tablet by mouth.    Historical Provider, MD    HYDROcodone-homatropine (HYDROMET) 5-1.5 MG/5ML syrup Take 5 mLs by mouth every 6 (six) hours as needed for cough. 02/13/16   Mosie Lukes, MD  KRILL OIL PO Take by mouth daily.    Historical Provider, MD  levothyroxine (SYNTHROID, LEVOTHROID) 75 MCG tablet Take 1 tablet (75 mcg total) by mouth daily. 11/09/15   Mosie Lukes, MD  lisinopril (PRINIVIL,ZESTRIL) 10 MG tablet Take 0.5 tablets (5 mg total) by mouth daily. 11/09/15   Mosie Lukes, MD  oxyCODONE (ROXICODONE) 5 MG immediate release tablet Take 0.5 tablets (2.5 mg total) by mouth every 4 (four) hours as needed for severe pain. 03/06/16   Deno Etienne, DO  psyllium (METAMUCIL) 58.6 % packet Take 1 packet by mouth daily.    Historical Provider, MD    Family History Family History  Problem Relation Age of Onset  . Breast cancer Mother     metastatic disease  . Heart disease Mother     Atrial fibrillation  . Kidney disease Father     Bright's disease  . Cancer Maternal Grandfather     prostate  . Obesity Daughter   . Thyroid disease Son   . Hyperlipidemia Son     Social History Social History  Substance Use Topics  . Smoking status: Former Smoker    Packs/day: 0.50    Years: 40.00    Types: Cigarettes    Start date: 06/09/1940    Quit date: 04/23/1979  . Smokeless tobacco: Never Used     Comment: quit smoking 40 years ago  . Alcohol use 0.0 oz/week     Comment: socially     Allergies   Latex and Lipitor [atorvastatin calcium]   Review of Systems Review of Systems  Constitutional: Negative for chills and fever.  HENT: Negative for congestion and rhinorrhea.   Eyes: Negative for redness and visual disturbance.  Respiratory: Negative for shortness of breath and wheezing.   Cardiovascular: Positive for chest pain. Negative for palpitations.  Gastrointestinal: Negative for nausea and vomiting.  Genitourinary: Negative for dysuria and urgency.  Musculoskeletal: Negative for arthralgias and myalgias.  Skin: Negative  for pallor and wound.  Neurological: Negative for dizziness and headaches.     Physical Exam Updated Vital Signs BP 119/65 (BP Location: Left Arm)   Pulse 75   Temp 98 F (36.7 C) (Oral)   Resp 18   Ht '5\' 5"'$  (1.651 m)   Wt 150 lb (68 kg)   SpO2 96%   BMI 24.96 kg/m   Physical Exam  Constitutional: She is oriented to person, place, and time. She appears well-developed and well-nourished. No distress.  HENT:  Head: Normocephalic and atraumatic.  Eyes: EOM are normal. Pupils are  equal, round, and reactive to light.  Neck: Normal range of motion. Neck supple.  Cardiovascular: Normal rate and regular rhythm.  Exam reveals no gallop and no friction rub.   No murmur heard. Pulmonary/Chest: Effort normal. She has no wheezes. She has no rales. She exhibits tenderness (TTP about the right posterior rib margin).  Abdominal: Soft. She exhibits no distension. There is no tenderness.  Musculoskeletal: She exhibits no edema or tenderness.  Neurological: She is alert and oriented to person, place, and time.  Skin: Skin is warm and dry. She is not diaphoretic.  Psychiatric: She has a normal mood and affect. Her behavior is normal.  Nursing note and vitals reviewed.    ED Treatments / Results  Labs (all labs ordered are listed, but only abnormal results are displayed) Labs Reviewed - No data to display  EKG  EKG Interpretation None       Radiology Dg Ribs Unilateral W/chest Right  Result Date: 03/06/2016 CLINICAL DATA:  Pain after coughing and sneezing EXAM: RIGHT RIBS AND CHEST - 3+ VIEW COMPARISON:  Chest radiograph June 09, 2015 FINDINGS: Frontal chest as well as oblique and cone-down lower rib images were obtained. There are several small calcified granulomas the right base. There is mild scarring in the left base. There is no edema or consolidation. Heart size is upper normal with pulmonary vascularity within normal limits. There is atherosclerotic calcification in the  aorta. No adenopathy. There are displaced posterolateral right sixth and seventh ribs. There is no evident pneumothorax or pleural effusion. No other fractures. IMPRESSION: Displaced posterolateral right sixth and seventh rib fractures. No pneumothorax or pleural effusion. No edema or consolidation. There are calcified granulomas in the lateral right base. There is aortic atherosclerosis. Electronically Signed   By: Lowella Grip III M.D.   On: 03/06/2016 10:42    Procedures Procedures (including critical care time)  Medications Ordered in ED Medications - No data to display   Initial Impression / Assessment and Plan / ED Course  I have reviewed the triage vital signs and the nursing notes.  Pertinent labs & imaging results that were available during my care of the patient were reviewed by me and considered in my medical decision making (see chart for details).  Clinical Course     82 yo F With a chief complaint of right-sided chest wall pain. This been going on for the past 10 days or so. Chest x-ray is concerning for new right-sided rib fractures. Patient is in no acute distress she's not tachypnic is afebrile. We'll give a small amount of pain medicine. PCP follow-up. Incentive spirometry.  Final Clinical Impressions(s) / ED Diagnoses   Final diagnoses:  Closed fracture of multiple ribs of right side, initial encounter    New Prescriptions Discharge Medication List as of 03/06/2016 10:50 AM    START taking these medications   Details  oxyCODONE (ROXICODONE) 5 MG immediate release tablet Take 0.5 tablets (2.5 mg total) by mouth every 4 (four) hours as needed for severe pain., Starting Wed 03/06/2016, [place], DO 03/06/16 1125

## 2016-03-08 ENCOUNTER — Encounter: Payer: Self-pay | Admitting: Family Medicine

## 2016-03-08 ENCOUNTER — Ambulatory Visit (INDEPENDENT_AMBULATORY_CARE_PROVIDER_SITE_OTHER): Payer: Medicare Other | Admitting: Family Medicine

## 2016-03-08 VITALS — BP 140/88 | HR 78 | Temp 97.9°F | Ht 65.0 in | Wt 159.4 lb

## 2016-03-08 DIAGNOSIS — I10 Essential (primary) hypertension: Secondary | ICD-10-CM

## 2016-03-08 DIAGNOSIS — I25810 Atherosclerosis of coronary artery bypass graft(s) without angina pectoris: Secondary | ICD-10-CM | POA: Diagnosis not present

## 2016-03-08 DIAGNOSIS — E039 Hypothyroidism, unspecified: Secondary | ICD-10-CM

## 2016-03-08 DIAGNOSIS — M8580 Other specified disorders of bone density and structure, unspecified site: Secondary | ICD-10-CM | POA: Diagnosis not present

## 2016-03-08 DIAGNOSIS — R296 Repeated falls: Secondary | ICD-10-CM | POA: Diagnosis not present

## 2016-03-08 NOTE — Patient Instructions (Signed)
Apply Aspercreme and alternate ice and heat/moist   Contusion A contusion is a deep bruise. Contusions are the result of a blunt injury to tissues and muscle fibers under the skin. The injury causes bleeding under the skin. The skin overlying the contusion may turn blue, purple, or yellow. Minor injuries will give you a painless contusion, but more severe contusions may stay painful and swollen for a few weeks. What are the causes? This condition is usually caused by a blow, trauma, or direct force to an area of the body. What are the signs or symptoms? Symptoms of this condition include:  Swelling of the injured area.  Pain and tenderness in the injured area.  Discoloration. The area may have redness and then turn blue, purple, or yellow. How is this diagnosed? This condition is diagnosed based on a physical exam and medical history. An X-ray, CT scan, or MRI may be needed to determine if there are any associated injuries, such as broken bones (fractures). How is this treated? Specific treatment for this condition depends on what area of the body was injured. In general, the best treatment for a contusion is resting, icing, applying pressure to (compression), and elevating the injured area. This is often called the RICE strategy. Over-the-counter anti-inflammatory medicines may also be recommended for pain control. Follow these instructions at home:  Rest the injured area.  If directed, apply ice to the injured area:  Put ice in a plastic bag.  Place a towel between your skin and the bag.  Leave the ice on for 20 minutes, 2-3 times per day.  If directed, apply light compression to the injured area using an elastic bandage. Make sure the bandage is not wrapped too tightly. Remove and reapply the bandage as directed by your health care provider.  If possible, raise (elevate) the injured area above the level of your heart while you are sitting or lying down.  Take over-the-counter  and prescription medicines only as told by your health care provider. Contact a health care provider if:  Your symptoms do not improve after several days of treatment.  Your symptoms get worse.  You have difficulty moving the injured area. Get help right away if:  You have severe pain.  You have numbness in a hand or foot.  Your hand or foot turns pale or cold. This information is not intended to replace advice given to you by your health care provider. Make sure you discuss any questions you have with your health care provider. Document Released: 01/16/2005 Document Revised: 08/17/2015 Document Reviewed: 08/24/2014 Elsevier Interactive Patient Education  2017 [employer].

## 2016-03-08 NOTE — Progress Notes (Signed)
Pre visit review using our clinic review tool, if applicable. No additional management support is needed unless otherwise documented below in the visit note. 

## 2016-03-19 ENCOUNTER — Ambulatory Visit: Payer: Medicare Other | Attending: Family Medicine | Admitting: Physical Therapy

## 2016-03-19 DIAGNOSIS — Z9181 History of falling: Secondary | ICD-10-CM

## 2016-03-19 DIAGNOSIS — M6281 Muscle weakness (generalized): Secondary | ICD-10-CM | POA: Diagnosis not present

## 2016-03-19 DIAGNOSIS — R2681 Unsteadiness on feet: Secondary | ICD-10-CM | POA: Diagnosis not present

## 2016-03-19 NOTE — Therapy (Signed)
[facility] [address]  [place], [place], [address] Phone: [phone]   Fax:  [phone]  Physical Therapy Evaluation  Patient Details  Name: Kylie Walters MRN: [id number] Date of Birth: Sep 19, 1924 Referring Provider: Penni Homans, MD  Encounter Date: 03/19/2016      PT End of Session - 03/19/16 1105    Visit Number 1   Number of Visits 16   Date for PT Re-Evaluation 05/17/16   Authorization Type Medicare   PT Start Time 1105   PT Stop Time 1148   PT Time Calculation (min) 43 min   Activity Tolerance Patient tolerated treatment well   Behavior During Therapy [facility] for tasks assessed/performed      Past Medical History:  Diagnosis Date  . Arthritis   . CTS (carpal tunnel syndrome) 06/18/2015  . Depression 4/03  . Diverticulosis   . Diverticulosis of colon without hemorrhage 10/07/2011  . Elevated blood pressure 07/24/2015  . GERD (gastroesophageal reflux disease)   . Granuloma of skin    RLL  . H/O measles   . H/O mumps   . Hearing loss   . History of chicken pox   . Hyperlipidemia   . Hypertension 07/24/2015  . Hypothyroidism   . Incontinence in female 02/06/2015  . Lumbar herniated disc   . Nocturia 06/18/2015  . Osteopenia   . Tinnitus 07/24/2015  . Vitamin D deficiency     Past Surgical History:  Procedure Laterality Date  . ABDOMINAL HYSTERECTOMY  1980  . APPENDECTOMY    . BREAST SURGERY     L breast cyst  . CATARACT EXTRACTION  2010  . [place]  . KNEE SURGERY  2004   left knee  . TONSILLECTOMY AND ADENOIDECTOMY  1939    There were no vitals filed for this visit.       Subjective Assessment - 03/19/16 1109    Subjective Pt reports she fell on 02/25/16, which she feels was the result of turning her R ankle (states her ankles are "double-jointed"). States rib fractures identified in ED are actually the result of strong sneeze.   Currently in  Pain? No/denies   Pain Score 0-No pain  up to 3-4/10   Pain Location Rib cage   Pain Orientation Right   Pain Descriptors / Indicators Aching   Pain Type Acute pain   Pain Onset 1 to 4 weeks ago   Pain Frequency Intermittent   Aggravating Factors  cough or sneeze   Pain Relieving Factors splint ribs with forearm            [facility] PT Assessment - 03/19/16 1105      Assessment   Medical Diagnosis Recurrent falls   Referring Provider Penni Homans, MD   Onset Date/Surgical Date 02/25/16   Next MD Visit 05/30/16   Prior Therapy PT for urinary incontinence 02/2015-03/2015; remote h/o PT for balance issues (~8 yrs ago)     Precautions   Precautions Fall     Balance Screen   Has the patient fallen in the past 6 months Yes   How many times? 1   Has the patient had a decrease in activity level because of a fear of falling?  No   Is the patient reluctant to leave their home because of a fear of falling?  No     Home [employer] residence   Living Arrangements  Alone   Type of Home Independent living facility  [place] Level entry;Elevator   Home Layout One level   [place] - single point;Walker - 2 wheels;Shower seat;Grab bars - toilet;Grab bars - tub/shower;Hand held shower head     Prior Function   Level of Independence Independent   Vocation Retired   Energy manager     Observation/Other Assessments   Focus on Therapeutic Outcomes (FOTO)  Neuromuscular disorder 46% (54% limitation); predicted 62% (38% limitation)     ROM / Strength   AROM / PROM / Strength Strength     Strength   Strength Assessment Site Hip;Knee;Ankle   Right/Left Hip Right;Left   Right Hip Flexion 4/5   Right Hip Extension 4/5   Right Hip External Rotation  4/5   Right Hip Internal Rotation 4/5   Right Hip ABduction 4+/5   Right Hip ADduction 4+/5   Left Hip Flexion 4/5   Left Hip Extension 4/5   Left Hip External Rotation 4/5    Left Hip Internal Rotation 4/5   Left Hip ABduction 4+/5   Left Hip ADduction 4+/5   Right/Left Knee Right;Left   Right Knee Flexion 4+/5   Right Knee Extension 4+/5   Left Knee Flexion 4+/5   Left Knee Extension 4+/5   Right/Left Ankle Right;Left   Right Ankle Dorsiflexion 4/5   Right Ankle Plantar Flexion 5/5   Right Ankle Inversion 4/5   Right Ankle Eversion 4/5   Left Ankle Dorsiflexion 4/5   Left Ankle Plantar Flexion 5/5   Left Ankle Inversion 4/5   Left Ankle Eversion 4/5     Ambulation/Gait   Gait Pattern Step-through pattern;Decreased arm swing - right;Decreased arm swing - left;Decreased stride length;Right foot flat;Left foot flat;Trunk flexed;Poor foot clearance - left;Poor foot clearance - right   Gait velocity 2.98 ft/sec  11.00 sec     Standardized Balance Assessment   Standardized Balance Assessment Timed Up and Go Test;10 meter walk test;Five Times Sit to Stand   Five times sit to stand comments  12.38 sec   10 Meter Walk 2.98 ft/sec  11.00 sec     Timed Up and Go Test   TUG Normal TUG;Manual TUG;Cognitive TUG   Normal TUG (seconds) 12.53   Manual TUG (seconds) 12.25   Cognitive TUG (seconds) 14.25     Functional Gait  Assessment   Gait assessed  Yes   Gait Level Surface Walks 20 ft in less than 7 sec but greater than 5.5 sec, uses assistive device, slower speed, mild gait deviations, or deviates 6-10 in outside of the 12 in walkway width.   Change in Gait Speed Able to change speed, demonstrates mild gait deviations, deviates 6-10 in outside of the 12 in walkway width, or no gait deviations, unable to achieve a major change in velocity, or uses a change in velocity, or uses an assistive device.   Gait with Horizontal Head Turns Performs head turns smoothly with slight change in gait velocity (eg, minor disruption to smooth gait path), deviates 6-10 in outside 12 in walkway width, or uses an assistive device.   Gait with Vertical Head Turns Performs task with  slight change in gait velocity (eg, minor disruption to smooth gait path), deviates 6 - 10 in outside 12 in walkway width or uses assistive device   Gait and Pivot Turn Pivot turns safely in greater than 3 sec and stops with no loss of balance, or pivot turns safely  within 3 sec and stops with mild imbalance, requires small steps to catch balance.   Step Over Obstacle Is able to step over one shoe box (4.5 in total height) but must slow down and adjust steps to clear box safely. May require verbal cueing.   Gait with Narrow Base of Support Ambulates 4-7 steps.   Gait with Eyes Closed Walks 20 ft, slow speed, abnormal gait pattern, evidence for imbalance, deviates 10-15 in outside 12 in walkway width. Requires more than 9 sec to ambulate 20 ft.   Ambulating Backwards Walks 20 ft, slow speed, abnormal gait pattern, evidence for imbalance, deviates 10-15 in outside 12 in walkway width.   Steps Alternating feet, must use rail.   Total Score 16   FGA comment: < 19 = high risk fall                   PT Education - 03/19/16 1145    Education provided Yes   Education Details PT eval findings and intended POC   Person(s) Educated Patient   Methods Explanation   Comprehension Verbalized understanding          PT Short Term Goals - 03/19/16 1148      PT SHORT TERM GOAL #1   Title Independent with initial strengthening HEP by 04/19/16   Status New     PT SHORT TERM GOAL #2   Title Verbalize understanding of safety precautions to decrease fall risk by 04/19/16   Status New     PT SHORT TERM GOAL #3   Title Improve score on FGA to >/= 19/30 to reduce risk for falls by 04/19/16   Status New           PT Long Term Goals - 03/19/16 1148      PT LONG TERM GOAL #1   Title Independent with advanced HEP for balance/LE strengthening by 05/17/16   Status New     PT LONG TERM GOAL #2   Title B LE strength 4+/5 or greater to improve stability by 05/17/16   Status New     PT LONG  TERM GOAL #3   Title FGA >/= 22/30 to decrease risk for falls by 05/17/16   Status New     PT LONG TERM GOAL #4   Title Decrease difference in times on normal, manual and cogntive TUG to </= 1 sec to reduce risk for falls by 05/17/16   Status New               Plan - 03/19/16 1148    Clinical Impression Statement Kylie Walters is a 82 y/o female who presents to OP PT for recurrent falls, with most recent fall on 02/25/16. Pt attributes most recent fall to ankle instability, stating she turned her R ankle causing her to fall resulting in trauma to her right side.  Pt denies any pain at time of eval. Assessment revealed mild bilateral LE weakness, most prominent in hips and ankle. Gait speed was decreased to 2.98 ft/sec on 10 MWT and TUG time ranged from 12.25 on manual TUG to 14.25 on cognitive TUG. Dynamic gait and balance testing via the FGA with score of 16/30 indicates a high risk for falls. During gait and balance testing pt more often demonstrated instability or LOB to R. POC will focus on core/LE strengthening to improve stability, along with balance and dynamic gait activities to decrease risk for falls.    Rehab Potential Good   Clinical Impairments Affecting Rehab  Potential HTN (newly diagnosed with new medications), OA/DJD s/p unspecified L knee sx 2004, h/o incontinence   PT Frequency 2x / week   PT Duration 8 weeks   PT Treatment/Interventions Patient/family education;Therapeutic exercise;Neuromuscular re-education;Balance training;Therapeutic activities;Functional mobility training;Gait training;Stair training;ADLs/Self Care Home Management   PT Next Visit Plan Create initial strengthening HEP with focus on hips and ankles   Consulted and Agree with Plan of Care Patient      Patient will benefit from skilled therapeutic intervention in order to improve the following deficits and impairments:  Decreased balance, Decreased strength, Difficulty walking, Decreased activity  tolerance  Visit Diagnosis: Unsteadiness on feet  History of falling  Muscle weakness (generalized)      G-Codes - 2016/04/03 1201    Functional Assessment Tool Used FGA = 16/30 (46.7% impaired)   Functional Limitation Mobility: Walking and moving around   Mobility: Walking and Moving Around Current Status [phone]) At least 40 percent but less than 60 percent impaired, limited or restricted   Mobility: Walking and Moving Around Goal Status [phone]) At least 20 percent but less than 40 percent impaired, limited or restricted       Problem List Patient Active Problem List   Diagnosis Date Noted  . Tinnitus 07/24/2015  . Hypertension 07/24/2015  . CTS (carpal tunnel syndrome) 06/18/2015  . Nocturia 06/18/2015  . Dyspnea 06/14/2015  . Incontinence in female 02/06/2015  . History of chicken pox   . Hyperlipidemia, mixed 05/24/2014  . Osteoarthritis 05/29/2013  . 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 Tobacco use disorder  Quit smoking 35 years ago  11/16/2012  . Calcified granuloma of lung ([place]) 11/16/2012  . Sinus bradycardia on ECG 11/16/2012  . TMJ arthralgia 03/10/2012  . Hypothyroidism 10/07/2011  . Osteopenia  Dexa done 05/27/2014 normal  10/07/2011  . Diverticulosis of colon without hemorrhage 10/07/2011  . Vitamin D deficiency 10/07/2011  . GERD (gastroesophageal reflux disease) 10/07/2011  . Hearing loss 10/07/2011  . DJD (degenerative joint disease) 10/07/2011  . History of depression 10/07/2011  . Disc herniation 10/07/2011  . H/O hysterectomy with oophorectomy 10/07/2011  . Family history of breast cancer in first degree relative 10/07/2011  . Thyroid cyst 10/07/2011    Percival Spanish, PT, MPT 2016/04/03, 3:20 PM  [facility] [address]  [place], [place], [address] Phone: [phone]   Fax:  [phone]  Name: RIMA BLIZZARD MRN: [id number] Date of Birth: [birth year]

## 2016-03-22 ENCOUNTER — Ambulatory Visit: Payer: Medicare Other | Attending: Family Medicine | Admitting: Physical Therapy

## 2016-03-22 DIAGNOSIS — R2681 Unsteadiness on feet: Secondary | ICD-10-CM | POA: Insufficient documentation

## 2016-03-22 DIAGNOSIS — M6281 Muscle weakness (generalized): Secondary | ICD-10-CM | POA: Diagnosis not present

## 2016-03-22 DIAGNOSIS — Z9181 History of falling: Secondary | ICD-10-CM

## 2016-03-22 NOTE — Therapy (Signed)
[facility] [address]  [place], [place], [address] Phone: [phone]   Fax:  [phone]  Physical Therapy Treatment  Patient Details  Name: Kylie Walters MRN: [id number] Date of Birth: 1925/02/11 Referring Provider: Penni Homans, MD  Encounter Date: 03/22/2016      PT End of Session - 03/22/16 0928    Visit Number 2   Number of Visits 16   Date for PT Re-Evaluation 05/17/16   Authorization Type Medicare   PT Start Time 0928   PT Stop Time 1015   PT Time Calculation (min) 47 min   Activity Tolerance Patient tolerated treatment well   Behavior During Therapy [facility] for tasks assessed/performed      Past Medical History:  Diagnosis Date  . Arthritis   . CTS (carpal tunnel syndrome) 06/18/2015  . Depression 4/03  . Diverticulosis   . Diverticulosis of colon without hemorrhage 10/07/2011  . Elevated blood pressure 07/24/2015  . GERD (gastroesophageal reflux disease)   . Granuloma of skin    RLL  . H/O measles   . H/O mumps   . Hearing loss   . History of chicken pox   . Hyperlipidemia   . Hypertension 07/24/2015  . Hypothyroidism   . Incontinence in female 02/06/2015  . Lumbar herniated disc   . Nocturia 06/18/2015  . Osteopenia   . Tinnitus 07/24/2015  . Vitamin D deficiency     Past Surgical History:  Procedure Laterality Date  . ABDOMINAL HYSTERECTOMY  1980  . APPENDECTOMY    . BREAST SURGERY     L breast cyst  . CATARACT EXTRACTION  2010  . [place]  . KNEE SURGERY  2004   left knee  . TONSILLECTOMY AND ADENOIDECTOMY  1939    There were no vitals filed for this visit.      Subjective Assessment - 03/22/16 0929    Subjective Pt states she has been trying to work on "standing on my toes" and notes some mid discomfort in her achillles (no pain, "just aggravating").   Patient Stated Goals "No more falls."   Currently in Pain? No/denies   Pain Onset 1  to 4 weeks ago                  [facility] Adult PT Treatment/Exercise - 03/22/16 0928      Exercises   Exercises Knee/Hip;Ankle     Knee/Hip Exercises: Aerobic   Nustep lvl 4 x 5'     Knee/Hip Exercises: Seated   Clamshell with TheraBand Green  10x3"   Marching Both;10 reps  3" hold   Marching Limitations green TB looped around lower thighs     Knee/Hip Exercises: Supine   Bridges Both;10 reps  5" hold   Other Supine Knee/Hip Exercises Hooklying clam & march with green TB x10 each   pt prefers seated version of these exercises     Ankle Exercises: Seated   Other Seated Ankle Exercises B anlkle TB exercises - PF with green x10, DF/IV/EV with red x10                PT Education - 03/22/16 1015    Education provided Yes   Education Details Initial HEP with green looped TB for proximal LE exercises & red/green TB as indicated for ankle exercises   Person(s) Educated Patient   Methods Explanation;Demonstration;Handout   Comprehension Verbalized understanding;Returned demonstration;Need further instruction  PT Short Term Goals - 03/19/16 1148      PT SHORT TERM GOAL #1   Title Independent with initial strengthening HEP by 04/19/16   Status New     PT SHORT TERM GOAL #2   Title Verbalize understanding of safety precautions to decrease fall risk by 04/19/16   Status New     PT SHORT TERM GOAL #3   Title Improve score on FGA to >/= 19/30 to reduce risk for falls by 04/19/16   Status New           PT Long Term Goals - 03/19/16 1148      PT LONG TERM GOAL #1   Title Independent with advanced HEP for balance/LE strengthening by 05/17/16   Status New     PT LONG TERM GOAL #2   Title B LE strength 4+/5 or greater to improve stability by 05/17/16   Status New     PT LONG TERM GOAL #3   Title FGA >/= 22/30 to decrease risk for falls by 05/17/16   Status New     PT LONG TERM GOAL #4   Title Decrease difference in times on normal, manual and  cogntive TUG to </= 1 sec to reduce risk for falls by 05/17/16   Status New               Plan - 03/22/16 0933    Clinical Impression Statement Initiated LE strengthening with focus on hips and ankles and provided initial HEP. Pt noting preference for seated versions of clam and hip flexion marching over hooklying as she feels that sitting is less straining on her back. Will continue emphasis on upright strengthening activities where appropriate.   Clinical Impairments Affecting Rehab Potential HTN (newly diagnosed with new medications), OA/DJD s/p unspecified L knee sx 2004, h/o incontinence   PT Treatment/Interventions Patient/family education;Therapeutic exercise;Neuromuscular re-education;Balance training;Therapeutic activities;Functional mobility training;Gait training;Stair training;ADLs/Self Care Home Management   PT Next Visit Plan Review initial strengthening HEP; Progress LE strengthening as tol with focus on hips and ankles; Balance training   Consulted and Agree with Plan of Care Patient      Patient will benefit from skilled therapeutic intervention in order to improve the following deficits and impairments:  Decreased balance, Decreased strength, Difficulty walking, Decreased activity tolerance  Visit Diagnosis: Unsteadiness on feet  History of falling  Muscle weakness (generalized)     Problem List Patient Active Problem List   Diagnosis Date Noted  . Tinnitus 07/24/2015  . Hypertension 07/24/2015  . CTS (carpal tunnel syndrome) 06/18/2015  . Nocturia 06/18/2015  . Dyspnea 06/14/2015  . Incontinence in female 02/06/2015  . History of chicken pox   . Hyperlipidemia, mixed 05/24/2014  . Osteoarthritis 05/29/2013  . 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 Tobacco use disorder  Quit smoking 35 years ago  11/16/2012  . Calcified granuloma of lung ([place]) 11/16/2012  . Sinus bradycardia on ECG 11/16/2012  . TMJ arthralgia 03/10/2012  . Hypothyroidism  10/07/2011  . Osteopenia  Dexa done 05/27/2014 normal  10/07/2011  . Diverticulosis of colon without hemorrhage 10/07/2011  . Vitamin D deficiency 10/07/2011  . GERD (gastroesophageal reflux disease) 10/07/2011  . Hearing loss 10/07/2011  . DJD (degenerative joint disease) 10/07/2011  . History of depression 10/07/2011  . Disc herniation 10/07/2011  . H/O hysterectomy with oophorectomy 10/07/2011  . Family history of breast cancer in first degree relative 10/07/2011  . Thyroid cyst 10/07/2011    Percival Spanish, PT,  MPT 03/22/2016, 10:35 AM  [facility] [address]  [place], [place], [address] Phone: [phone]   Fax:  [phone]  Name: Kylie Walters MRN: [id number] Date of Birth: [birth year]

## 2016-03-24 DIAGNOSIS — R296 Repeated falls: Secondary | ICD-10-CM | POA: Insufficient documentation

## 2016-03-24 NOTE — Assessment & Plan Note (Signed)
With recent rib fracture, pain improving. Referred for physical therapy

## 2016-03-24 NOTE — Assessment & Plan Note (Signed)
On Levothyroxine, continue to monitor 

## 2016-03-24 NOTE — Progress Notes (Signed)
Patient ID: Kylie Walters, female   DOB: 15-Mar-1925, 82 y.o.   MRN: [id number]   Subjective:    Patient ID: Kylie Walters, female    DOB: May 24, 1924, 82 y.o.   MRN: [id number]  Chief Complaint  Patient presents with  . Follow-up    ED    HPI Patient is in today for follow up. No recent illness or hospitalization. She was seen in hospital after fall that resulted in a rib fracture. She has only taken 1/2 of an Oxycodone since being seen in the hospital. She notes some swelling in her right arm. Denies palp/SOB/HA/congestion/fevers/GI or GU c/o. Taking meds as prescribed  Past Medical History:  Diagnosis Date  . Arthritis   . CTS (carpal tunnel syndrome) 06/18/2015  . Depression 4/03  . Diverticulosis   . Diverticulosis of colon without hemorrhage 10/07/2011  . Elevated blood pressure 07/24/2015  . GERD (gastroesophageal reflux disease)   . Granuloma of skin    RLL  . H/O measles   . H/O mumps   . Hearing loss   . History of chicken pox   . Hyperlipidemia   . Hypertension 07/24/2015  . Hypothyroidism   . Incontinence in female 02/06/2015  . Lumbar herniated disc   . Nocturia 06/18/2015  . Osteopenia   . Tinnitus 07/24/2015  . Vitamin D deficiency     Past Surgical History:  Procedure Laterality Date  . ABDOMINAL HYSTERECTOMY  1980  . APPENDECTOMY    . BREAST SURGERY     L breast cyst  . CATARACT EXTRACTION  2010  . [place]  . KNEE SURGERY  2004   left knee  . TONSILLECTOMY AND ADENOIDECTOMY  1939    Family History  Problem Relation Age of Onset  . Breast cancer Mother     metastatic disease  . Heart disease Mother     Atrial fibrillation  . Kidney disease Father     Bright's disease  . Cancer Maternal Grandfather     prostate  . Obesity Daughter   . Thyroid disease Son   . Hyperlipidemia Son     Social History   Social History  . Marital status: Widowed    Spouse name: N/A  . Number of children: 3  . Years of  education: N/A   Occupational History  . Not on file.   Social History Main Topics  . Smoking status: Former Smoker    Packs/day: 0.50    Years: 40.00    Types: Cigarettes    Start date: 06/09/1940    Quit date: 04/23/1979  . Smokeless tobacco: Never Used     Comment: quit smoking 40 years ago  . Alcohol use 0.0 oz/week     Comment: socially  . Drug use: No  . Sexual activity: Not Currently    Birth control/ protection: Post-menopausal     Comment: [facility] widowed. no dietary restrictions   Other Topics Concern  . Not on file   Social History Narrative  . No narrative on file    Outpatient Medications Prior to Visit  Medication Sig Dispense Refill  . aspirin EC 81 MG tablet Take 81 mg by mouth daily.    . calcium-vitamin D (OSCAL WITH D) 500-200 MG-UNIT per tablet Take 1 tablet by mouth.    Marland Kitchen HYDROcodone-homatropine (HYDROMET) 5-1.5 MG/5ML syrup Take 5 mLs by mouth every 6 (six) hours as needed for cough. (Patient not taking: Reported  on 03/19/2016) 120 mL 0  . KRILL OIL PO Take by mouth daily.    Marland Kitchen levothyroxine (SYNTHROID, LEVOTHROID) 75 MCG tablet Take 1 tablet (75 mcg total) by mouth daily. 30 tablet 5  . lisinopril (PRINIVIL,ZESTRIL) 10 MG tablet Take 0.5 tablets (5 mg total) by mouth daily. 15 tablet 5  . oxyCODONE (ROXICODONE) 5 MG immediate release tablet Take 0.5 tablets (2.5 mg total) by mouth every 4 (four) hours as needed for severe pain. (Patient not taking: Reported on 03/19/2016) 10 tablet 0  . psyllium (METAMUCIL) 58.6 % packet Take 1 packet by mouth daily.     No facility-administered medications prior to visit.     Allergies  Allergen Reactions  . Latex Itching  . Lipitor [Atorvastatin Calcium] Other (See Comments)    Myalgia     Review of Systems  Constitutional: Positive for malaise/fatigue. Negative for fever.  HENT: Negative for congestion.   Eyes: Negative for blurred vision.  Respiratory: Negative for shortness of breath.     Cardiovascular: Positive for chest pain. Negative for palpitations and leg swelling.  Gastrointestinal: Negative for abdominal pain, blood in stool and nausea.  Genitourinary: Negative for dysuria and frequency.  Musculoskeletal: Positive for myalgias. Negative for falls.  Skin: Negative for rash.  Neurological: Negative for dizziness, loss of consciousness and headaches.  Endo/Heme/Allergies: Negative for environmental allergies.  Psychiatric/Behavioral: Negative for depression. The patient is not nervous/anxious.        Objective:    Physical Exam  Constitutional: She is oriented to person, place, and time. She appears well-developed and well-nourished. No distress.  HENT:  Head: Normocephalic and atraumatic.  Nose: Nose normal.  Eyes: Right eye exhibits no discharge. Left eye exhibits no discharge.  Neck: Normal range of motion. Neck supple.  Cardiovascular: Normal rate and regular rhythm.   No murmur heard. Pulmonary/Chest: Effort normal and breath sounds normal.  Abdominal: Soft. Bowel sounds are normal. There is no tenderness.  Musculoskeletal: She exhibits no edema.  Pain with palpation over lateral chest wall.  Neurological: She is alert and oriented to person, place, and time.  Skin: Skin is warm and dry.  Psychiatric: She has a normal mood and affect.  Nursing note and vitals reviewed.   BP 140/88 (BP Location: Left Arm, Patient Position: Sitting, Cuff Size: Normal)   Pulse 78   Temp 97.9 F (36.6 C) (Oral)   Ht '5\' 5"'$  (1.651 m)   Wt 159 lb 6 oz (72.3 kg)   SpO2 96%   BMI 26.52 kg/m  Wt Readings from Last 3 Encounters:  03/08/16 159 lb 6 oz (72.3 kg)  03/06/16 150 lb (68 kg)  01/04/16 156 lb (70.8 kg)     Lab Results  Component Value Date   WBC 6.6 11/09/2015   HGB 14.2 11/09/2015   HCT 42.5 11/09/2015   PLT 237.0 11/09/2015   GLUCOSE 89 11/09/2015   CHOL 197 06/09/2015   TRIG 96.0 06/09/2015   HDL 52.00 06/09/2015   LDLCALC 126 (H) 06/09/2015    ALT 11 11/09/2015   AST 15 11/09/2015   NA 138 11/09/2015   K 4.3 11/09/2015   CL 102 11/09/2015   CREATININE 0.68 11/09/2015   BUN 15 11/09/2015   CO2 29 11/09/2015   TSH 0.82 11/09/2015    Lab Results  Component Value Date   TSH 0.82 11/09/2015   Lab Results  Component Value Date   WBC 6.6 11/09/2015   HGB 14.2 11/09/2015   HCT 42.5 11/09/2015  MCV 89.7 11/09/2015   PLT 237.0 11/09/2015   Lab Results  Component Value Date   NA 138 11/09/2015   K 4.3 11/09/2015   CO2 29 11/09/2015   GLUCOSE 89 11/09/2015   BUN 15 11/09/2015   CREATININE 0.68 11/09/2015   BILITOT 0.6 11/09/2015   ALKPHOS 62 11/09/2015   AST 15 11/09/2015   ALT 11 11/09/2015   PROT 7.1 11/09/2015   ALBUMIN 4.2 11/09/2015   CALCIUM 10.5 11/09/2015   GFR 86.53 11/09/2015   Lab Results  Component Value Date   CHOL 197 06/09/2015   Lab Results  Component Value Date   HDL 52.00 06/09/2015   Lab Results  Component Value Date   LDLCALC 126 (H) 06/09/2015   Lab Results  Component Value Date   TRIG 96.0 06/09/2015   Lab Results  Component Value Date   CHOLHDL 4 06/09/2015   No results found for: HGBA1C     Assessment & Plan:   Problem List Items Addressed This Visit    Hypothyroidism    On Levothyroxine, continue to monitor      Osteopenia  Dexa done 05/27/2014 normal     Encouraged to get adequate exercise, calcium and vitamin d intake       Hypertension    Well controlled, no changes to meds. Encouraged heart healthy diet such as the DASH diet and exercise as tolerated.       Recurrent falls - Primary    With recent rib fracture, pain improving. Referred for physical therapy       Relevant Orders   Ambulatory referral to Physical Therapy      I am having Ms. Klaiber maintain her calcium-vitamin D, psyllium, KRILL OIL PO, lisinopril, levothyroxine, aspirin EC, HYDROcodone-homatropine, and oxyCODONE.  No orders of the defined types were placed in this  encounter.    Penni Homans, MD

## 2016-03-24 NOTE — Assessment & Plan Note (Signed)
Encouraged to get adequate exercise, calcium and vitamin d intake 

## 2016-03-24 NOTE — Assessment & Plan Note (Signed)
Well controlled, no changes to meds. Encouraged heart healthy diet such as the DASH diet and exercise as tolerated.  °

## 2016-03-25 ENCOUNTER — Ambulatory Visit: Payer: Medicare Other

## 2016-03-25 DIAGNOSIS — R2681 Unsteadiness on feet: Secondary | ICD-10-CM | POA: Diagnosis not present

## 2016-03-25 DIAGNOSIS — M6281 Muscle weakness (generalized): Secondary | ICD-10-CM | POA: Diagnosis not present

## 2016-03-25 DIAGNOSIS — Z9181 History of falling: Secondary | ICD-10-CM

## 2016-03-25 NOTE — Therapy (Signed)
[facility] [address]  [place], [place], [address] Phone: [phone]   Fax:  [phone]  Physical Therapy Treatment  Patient Details  Name: Kylie Walters MRN: [id number] Date of Birth: 11/13/1924 Referring Provider: Penni Homans, MD  Encounter Date: 03/25/2016      PT End of Session - 03/25/16 1407    Visit Number 3   Number of Visits 16   Date for PT Re-Evaluation 05/17/16   Authorization Type Medicare   PT Start Time 1400   PT Stop Time 1440   PT Time Calculation (min) 40 min   Activity Tolerance Patient tolerated treatment well   Behavior During Therapy [facility] for tasks assessed/performed      Past Medical History:  Diagnosis Date  . Arthritis   . CTS (carpal tunnel syndrome) 06/18/2015  . Depression 4/03  . Diverticulosis   . Diverticulosis of colon without hemorrhage 10/07/2011  . Elevated blood pressure 07/24/2015  . GERD (gastroesophageal reflux disease)   . Granuloma of skin    RLL  . H/O measles   . H/O mumps   . Hearing loss   . History of chicken pox   . Hyperlipidemia   . Hypertension 07/24/2015  . Hypothyroidism   . Incontinence in female 02/06/2015  . Lumbar herniated disc   . Nocturia 06/18/2015  . Osteopenia   . Tinnitus 07/24/2015  . Vitamin D deficiency     Past Surgical History:  Procedure Laterality Date  . ABDOMINAL HYSTERECTOMY  1980  . APPENDECTOMY    . BREAST SURGERY     L breast cyst  . CATARACT EXTRACTION  2010  . [place]  . KNEE SURGERY  2004   left knee  . TONSILLECTOMY AND ADENOIDECTOMY  1939    There were no vitals filed for this visit.      Subjective Assessment - 03/25/16 1406    Subjective Pt. reporting she felt, "muscles she hasn't felt before", following last treatment.  Pt. reporting this pain only felt like muscular soreness.     Patient Stated Goals "No more falls."   Currently in Pain? No/denies   Pain  Score 0-No pain   Multiple Pain Sites No               [facility] Adult PT Treatment/Exercise - 03/25/16 1426      Knee/Hip Exercises: Aerobic   Nustep lvl 5 x 5'     Knee/Hip Exercises: Standing   Other Standing Knee Exercises Standing march on blue airex pad x 20 reps each side and close CGA from therapist   Other Standing Knee Exercises Standing alternating toe-touch to cones while standing on airex pad x 10 reps each side     Knee/Hip Exercises: Seated   Clamshell with TheraBand Green  clam shell with theraband x 10 reps  3" hold    Marching Both;10 reps  3" hold    Marching Limitations green TB looped around lower thighs     Knee/Hip Exercises: Supine   Bridges Both;10 reps  5" hold    Other Supine Knee/Hip Exercises Hooklying clam & march with green TB x10 each    Other Supine Knee/Hip Exercises Hooklying bridge with B sustained hip abd/ER with green TB x 10 reps     Ankle Exercises: Seated   Other Seated Ankle Exercises R ankle TB exercises - PF, DF/IV/EV with green x10  PT Short Term Goals - 03/25/16 1412      PT SHORT TERM GOAL #1   Title Independent with initial strengthening HEP by 04/19/16   Status On-going     PT SHORT TERM GOAL #2   Title Verbalize understanding of safety precautions to decrease fall risk by 04/19/16   Status On-going     PT SHORT TERM GOAL #3   Title Improve score on FGA to >/= 19/30 to reduce risk for falls by 04/19/16   Status On-going           PT Long Term Goals - 03/25/16 1412      PT LONG TERM GOAL #1   Title Independent with advanced HEP for balance/LE strengthening by 05/17/16   Status On-going     PT LONG TERM GOAL #2   Title B LE strength 4+/5 or greater to improve stability by 05/17/16   Status On-going     PT LONG TERM GOAL #3   Title FGA >/= 22/30 to decrease risk for falls by 05/17/16   Status On-going     PT LONG TERM GOAL #4   Title Decrease difference in times on normal, manual  and cogntive TUG to </= 1 sec to reduce risk for falls by 05/17/16   Status On-going               Plan - 03/25/16 1416    Clinical Impression Statement Pt. with good recall and demonstration of hip/ankle strengthening HEP today.  Pt. brought HEP with her today.  Conservative standing balance training initiated today on compliant surface.  Pt. requiring close CGA from therapist throughout balance activities however very compliant and motivated with these.  Will plan to continue to progress strengthening activity and balance/proprioception training per tolerance.     PT Treatment/Interventions Patient/family education;Therapeutic exercise;Neuromuscular re-education;Balance training;Therapeutic activities;Functional mobility training;Gait training;Stair training;ADLs/Self Care Home Management   PT Next Visit Plan Progress LE strengthening as tol with focus on hips and ankles; Balance training      Patient will benefit from skilled therapeutic intervention in order to improve the following deficits and impairments:  Decreased balance, Decreased strength, Difficulty walking, Decreased activity tolerance  Visit Diagnosis: Unsteadiness on feet  History of falling  Muscle weakness (generalized)     Problem List Patient Active Problem List   Diagnosis Date Noted  . Recurrent falls 03/24/2016  . Tinnitus 07/24/2015  . Hypertension 07/24/2015  . CTS (carpal tunnel syndrome) 06/18/2015  . Nocturia 06/18/2015  . Dyspnea 06/14/2015  . Incontinence in female 02/06/2015  . History of chicken pox   . Hyperlipidemia, mixed 05/24/2014  . Osteoarthritis 05/29/2013  . 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 Tobacco use disorder  Quit smoking 35 years ago  11/16/2012  . Calcified granuloma of lung ([place]) 11/16/2012  . Sinus bradycardia on ECG 11/16/2012  . TMJ arthralgia 03/10/2012  . Hypothyroidism 10/07/2011  . Osteopenia  Dexa done 05/27/2014 normal  10/07/2011  . Diverticulosis of  colon without hemorrhage 10/07/2011  . Vitamin D deficiency 10/07/2011  . GERD (gastroesophageal reflux disease) 10/07/2011  . Hearing loss 10/07/2011  . DJD (degenerative joint disease) 10/07/2011  . History of depression 10/07/2011  . Disc herniation 10/07/2011  . H/O hysterectomy with oophorectomy 10/07/2011  . Family history of breast cancer in first degree relative 10/07/2011  . Thyroid cyst 10/07/2011    Bess Harvest, PTA 03/25/16 4:50 PM  [facility]  [place], [place], [address] Phone: [phone]   Fax:  [phone]  Name: MAKYLIE RIVERE MRN: [id number] Date of Birth: [birth year]

## 2016-03-28 ENCOUNTER — Ambulatory Visit: Payer: Medicare Other

## 2016-03-28 DIAGNOSIS — M6281 Muscle weakness (generalized): Secondary | ICD-10-CM

## 2016-03-28 DIAGNOSIS — R2681 Unsteadiness on feet: Secondary | ICD-10-CM | POA: Diagnosis not present

## 2016-03-28 DIAGNOSIS — Z9181 History of falling: Secondary | ICD-10-CM | POA: Diagnosis not present

## 2016-03-28 NOTE — Therapy (Signed)
[facility] [address]  [place], [place], [address] Phone: [phone]   Fax:  [phone]  Physical Therapy Treatment  Patient Details  Name: Kylie Walters MRN: [id number] Date of Birth: [birth year] Referring Provider: Penni Homans, MD  Encounter Date: 03/28/2016      PT End of Session - 03/28/16 0939    Visit Number 4   Number of Visits 16   Date for PT Re-Evaluation 05/17/16   Authorization Type Medicare   PT Start Time 0932   PT Stop Time 1015   PT Time Calculation (min) 43 min   Activity Tolerance Patient tolerated treatment well   Behavior During Therapy [facility] for tasks assessed/performed      Past Medical History:  Diagnosis Date  . Arthritis   . CTS (carpal tunnel syndrome) 06/18/2015  . Depression 4/03  . Diverticulosis   . Diverticulosis of colon without hemorrhage 10/07/2011  . Elevated blood pressure 07/24/2015  . GERD (gastroesophageal reflux disease)   . Granuloma of skin    RLL  . H/O measles   . H/O mumps   . Hearing loss   . History of chicken pox   . Hyperlipidemia   . Hypertension 07/24/2015  . Hypothyroidism   . Incontinence in female 02/06/2015  . Lumbar herniated disc   . Nocturia 06/18/2015  . Osteopenia   . Tinnitus 07/24/2015  . Vitamin D deficiency     Past Surgical History:  Procedure Laterality Date  . ABDOMINAL HYSTERECTOMY  1980  . APPENDECTOMY    . BREAST SURGERY     L breast cyst  . CATARACT EXTRACTION  2010  . [place]  . KNEE SURGERY  2004   left knee  . TONSILLECTOMY AND ADENOIDECTOMY  1939    There were no vitals filed for this visit.      Subjective Assessment - 03/28/16 0934    Subjective Pt. reporting she is currently working out at [employer] 3x/wk performing NuStep, and water aerobics.    Patient Stated Goals "No more falls."   Currently in Pain? No/denies   Pain Score 0-No pain   Multiple Pain Sites No        Today's treatment:   [place]: NuStep: level 5, 6 min   Neuro Re-education (close CGA provided from therapist with all activities): Standing march on airex pad x 20 reps each  Standing toe-touch to 6" step x 15 reps each side Standing toe-touch to 8" cones x 10 reps each side Standing cross-over toe-touch to cones x 10 reps each side  Next to counter:           Tandem walk x 2 laps down/back            Side stepping x 2 laps down back  Rubber ball step-over 4 x 6 reps  Rubber ball side step-over 4 x 6 reps Rubber ball figure 8 weave 2 x 6 reps                  PT Short Term Goals - 03/25/16 1412      PT SHORT TERM GOAL #1   Title Independent with initial strengthening HEP by 04/19/16   Status On-going     PT SHORT TERM GOAL #2   Title Verbalize understanding of safety precautions to decrease fall risk by 04/19/16   Status On-going     PT SHORT TERM GOAL #3  Title Improve score on FGA to >/= 19/30 to reduce risk for falls by 04/19/16   Status On-going           PT Long Term Goals - 03/25/16 1412      PT LONG TERM GOAL #1   Title Independent with advanced HEP for balance/LE strengthening by 05/17/16   Status On-going     PT LONG TERM GOAL #2   Title B LE strength 4+/5 or greater to improve stability by 05/17/16   Status On-going     PT LONG TERM GOAL #3   Title FGA >/= 22/30 to decrease risk for falls by 05/17/16   Status On-going     PT LONG TERM GOAL #4   Title Decrease difference in times on normal, manual and cogntive TUG to </= 1 sec to reduce risk for falls by 05/17/16   Status On-going               Plan - 03/28/16 0939    Clinical Impression Statement Pt. tolerated treatment focused on balance and proprioception training well today.  Pt. balance very challenged on compliant surface today even with basic stepping tasks.  Pt. will continue to benefit from strengthening, balance, and proprioceptive activity to improve balance and reduce risk  of falls.   PT Treatment/Interventions Patient/family education;Therapeutic exercise;Neuromuscular re-education;Balance training;Therapeutic activities;Functional mobility training;Gait training;Stair training;ADLs/Self Care Home Management   PT Next Visit Plan Progress LE strengthening as tol with focus on hips and ankles; Balance training      Patient will benefit from skilled therapeutic intervention in order to improve the following deficits and impairments:  Decreased balance, Decreased strength, Difficulty walking, Decreased activity tolerance  Visit Diagnosis: Unsteadiness on feet  History of falling  Muscle weakness (generalized)     Problem List Patient Active Problem List   Diagnosis Date Noted  . Recurrent falls 03/24/2016  . Tinnitus 07/24/2015  . Hypertension 07/24/2015  . CTS (carpal tunnel syndrome) 06/18/2015  . Nocturia 06/18/2015  . Dyspnea 06/14/2015  . Incontinence in female 02/06/2015  . History of chicken pox   . Hyperlipidemia, mixed 05/24/2014  . Osteoarthritis 05/29/2013  . 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 Tobacco use disorder  Quit smoking 35 years ago  11/16/2012  . Calcified granuloma of lung ([place]) 11/16/2012  . Sinus bradycardia on ECG 11/16/2012  . TMJ arthralgia 03/10/2012  . Hypothyroidism 10/07/2011  . Osteopenia  Dexa done 05/27/2014 normal  10/07/2011  . Diverticulosis of colon without hemorrhage 10/07/2011  . Vitamin D deficiency 10/07/2011  . GERD (gastroesophageal reflux disease) 10/07/2011  . Hearing loss 10/07/2011  . DJD (degenerative joint disease) 10/07/2011  . History of depression 10/07/2011  . Disc herniation 10/07/2011  . H/O hysterectomy with oophorectomy 10/07/2011  . Family history of breast cancer in first degree relative 10/07/2011  . Thyroid cyst 10/07/2011    Bess Harvest, PTA 03/28/16 3:30 PM  [facility] [address]  [place],  [place], [address] Phone: [phone]   Fax:  [phone]  Name: MARTIZA SPETH MRN: [id number] Date of Birth: 04-08-1925

## 2016-04-01 ENCOUNTER — Ambulatory Visit: Payer: Medicare Other | Admitting: Physical Therapy

## 2016-04-01 DIAGNOSIS — Z9181 History of falling: Secondary | ICD-10-CM

## 2016-04-01 DIAGNOSIS — R2681 Unsteadiness on feet: Secondary | ICD-10-CM

## 2016-04-01 DIAGNOSIS — M6281 Muscle weakness (generalized): Secondary | ICD-10-CM

## 2016-04-01 NOTE — Therapy (Signed)
[facility] [address]  [place], [place], [address] Phone: [phone]   Fax:  [phone]  Physical Therapy Treatment  Patient Details  Name: Kylie Walters MRN: [id number] Date of Birth: [birth year] Referring Provider: Penni Homans, MD  Encounter Date: 04/01/2016      PT End of Session - 04/01/16 1314    Visit Number 5   Number of Visits 16   Date for PT Re-Evaluation 05/17/16   Authorization Type Medicare   PT Start Time 1314   PT Stop Time 9798   PT Time Calculation (min) 51 min   Activity Tolerance Patient tolerated treatment well   Behavior During Therapy [facility] for tasks assessed/performed      Past Medical History:  Diagnosis Date  . Arthritis   . CTS (carpal tunnel syndrome) 06/18/2015  . Depression 4/03  . Diverticulosis   . Diverticulosis of colon without hemorrhage 10/07/2011  . Elevated blood pressure 07/24/2015  . GERD (gastroesophageal reflux disease)   . Granuloma of skin    RLL  . H/O measles   . H/O mumps   . Hearing loss   . History of chicken pox   . Hyperlipidemia   . Hypertension 07/24/2015  . Hypothyroidism   . Incontinence in female 02/06/2015  . Lumbar herniated disc   . Nocturia 06/18/2015  . Osteopenia   . Tinnitus 07/24/2015  . Vitamin D deficiency     Past Surgical History:  Procedure Laterality Date  . ABDOMINAL HYSTERECTOMY  1980  . APPENDECTOMY    . BREAST SURGERY     L breast cyst  . CATARACT EXTRACTION  2010  . [place]  . KNEE SURGERY  2004   left knee  . TONSILLECTOMY AND ADENOIDECTOMY  1939    There were no vitals filed for this visit.      Subjective Assessment - 04/01/16 1319    Subjective Pt requesting set-up info for NuStep to be used in the fitness room at [employer]. Continues to note feeling of imbalance.   Patient Stated Goals "No more falls."   Currently in Pain? No/denies                          [facility] Adult PT Treatment/Exercise - 04/01/16 1314      Knee/Hip Exercises: Aerobic   Nustep lvl 5 x 7'             Balance Exercises - 04/01/16 1314      OTAGO PROGRAM   Head Movements Standing;5 reps   Neck Movements Standing;5 reps   Back Extension Standing;5 reps   Trunk Movements Standing;5 reps   Ankle Movements Standing;10 reps   Knee Extensor 10 reps   Knee Flexor 10 reps   Hip ABductor 10 reps   Ankle Plantorflexors 20 reps, support   Ankle Dorsiflexors 20 reps, support   Knee Bends 10 reps, no support   Backwards Walking Support   Walking and Turning Around No assistive device   Sideways Walking Assistive device  light UE support on counter   Tandem Stance 10 seconds, support   Tandem Walk Support   One Leg Stand 10 seconds, support   Heel Walking Support   Toe Walk Support   Sit to Stand 5 reps, one support           PT Education - 04/01/16 1418    Education  provided Yes   Education Details OTAGO fall prevention program provided as part of HEP - pt to perform highlighted exercises 3x/wk on alternate days with exisiting HEP   Person(s) Educated Patient   Methods Explanation;Demonstration;Handout   Comprehension Verbalized understanding;Returned demonstration;Need further instruction          PT Short Term Goals - 03/25/16 1412      PT SHORT TERM GOAL #1   Title Independent with initial strengthening HEP by 04/19/16   Status On-going     PT SHORT TERM GOAL #2   Title Verbalize understanding of safety precautions to decrease fall risk by 04/19/16   Status On-going     PT SHORT TERM GOAL #3   Title Improve score on FGA to >/= 19/30 to reduce risk for falls by 04/19/16   Status On-going           PT Long Term Goals - 03/25/16 1412      PT LONG TERM GOAL #1   Title Independent with advanced HEP for balance/LE strengthening by 05/17/16   Status On-going     PT LONG TERM GOAL #2   Title B LE  strength 4+/5 or greater to improve stability by 05/17/16   Status On-going     PT LONG TERM GOAL #3   Title FGA >/= 22/30 to decrease risk for falls by 05/17/16   Status On-going     PT LONG TERM GOAL #4   Title Decrease difference in times on normal, manual and cogntive TUG to </= 1 sec to reduce risk for falls by 05/17/16   Status On-going               Plan - 04/01/16 1321    Clinical Impression Statement Pt reports she still has to read through the initial HEP each time before performing the exercises, but feels like she has a good understanding of them and finds the proximal exercises seem to help her feel stronger. Introduced [place] fall prevention program today with pt performing majority of exercises with UE support but able to demonstrate good technique with all activities attempted with only minor corrections/clarifications. OTAGO handout provided to pt with recommended activities highlighted and instructions to perform 3x/wk on alternate days with existing HEP.   PT Treatment/Interventions Patient/family education;Therapeutic exercise;Neuromuscular re-education;Balance training;Therapeutic activities;Functional mobility training;Gait training;Stair training;ADLs/Self Care Home Management   PT Next Visit Plan Progress LE strengthening as tol with focus on hips and ankles; Balance training      Patient will benefit from skilled therapeutic intervention in order to improve the following deficits and impairments:  Decreased balance, Decreased strength, Difficulty walking, Decreased activity tolerance  Visit Diagnosis: Unsteadiness on feet  History of falling  Muscle weakness (generalized)     Problem List Patient Active Problem List   Diagnosis Date Noted  . Recurrent falls 03/24/2016  . Tinnitus 07/24/2015  . Hypertension 07/24/2015  . CTS (carpal tunnel syndrome) 06/18/2015  . Nocturia 06/18/2015  . Dyspnea 06/14/2015  . Incontinence in female 02/06/2015  .  History of chicken pox   . Hyperlipidemia, mixed 05/24/2014  . Osteoarthritis 05/29/2013  . 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 Tobacco use disorder  Quit smoking 35 years ago  11/16/2012  . Calcified granuloma of lung ([place]) 11/16/2012  . Sinus bradycardia on ECG 11/16/2012  . TMJ arthralgia 03/10/2012  . Hypothyroidism 10/07/2011  . Osteopenia  Dexa done 05/27/2014 normal  10/07/2011  . Diverticulosis of colon without hemorrhage 10/07/2011  . Vitamin D  deficiency 10/07/2011  . GERD (gastroesophageal reflux disease) 10/07/2011  . Hearing loss 10/07/2011  . DJD (degenerative joint disease) 10/07/2011  . History of depression 10/07/2011  . Disc herniation 10/07/2011  . H/O hysterectomy with oophorectomy 10/07/2011  . Family history of breast cancer in first degree relative 10/07/2011  . Thyroid cyst 10/07/2011    Percival Spanish, PT, MPT 04/01/2016, 2:20 PM  [facility] [address]  [address] [place], [place], [address] Phone: [phone]   Fax:  [phone]  Name: Kylie Walters MRN: [id number] Date of Birth: May 12, 1924

## 2016-04-04 ENCOUNTER — Ambulatory Visit: Payer: Medicare Other | Admitting: Physical Therapy

## 2016-04-04 DIAGNOSIS — Z9181 History of falling: Secondary | ICD-10-CM

## 2016-04-04 DIAGNOSIS — M6281 Muscle weakness (generalized): Secondary | ICD-10-CM | POA: Diagnosis not present

## 2016-04-04 DIAGNOSIS — R2681 Unsteadiness on feet: Secondary | ICD-10-CM

## 2016-04-04 NOTE — Therapy (Signed)
[facility] [address]  [place], [place], [address] Phone: [phone]   Fax:  [phone]  Physical Therapy Treatment  Patient Details  Name: Kylie Walters MRN: [id number] Date of Birth: Apr 29, 1924 Referring Provider: Penni Homans, MD  Encounter Date: 04/04/2016      PT End of Session - 04/04/16 0930    Visit Number 6   Number of Visits 16   Date for PT Re-Evaluation 05/17/16   Authorization Type Medicare   PT Start Time 0930   PT Stop Time 1020   PT Time Calculation (min) 50 min   Activity Tolerance Patient tolerated treatment well   Behavior During Therapy [facility] for tasks assessed/performed      Past Medical History:  Diagnosis Date  . Arthritis   . CTS (carpal tunnel syndrome) 06/18/2015  . Depression 4/03  . Diverticulosis   . Diverticulosis of colon without hemorrhage 10/07/2011  . Elevated blood pressure 07/24/2015  . GERD (gastroesophageal reflux disease)   . Granuloma of skin    RLL  . H/O measles   . H/O mumps   . Hearing loss   . History of chicken pox   . Hyperlipidemia   . Hypertension 07/24/2015  . Hypothyroidism   . Incontinence in female 02/06/2015  . Lumbar herniated disc   . Nocturia 06/18/2015  . Osteopenia   . Tinnitus 07/24/2015  . Vitamin D deficiency     Past Surgical History:  Procedure Laterality Date  . ABDOMINAL HYSTERECTOMY  1980  . APPENDECTOMY    . BREAST SURGERY     L breast cyst  . CATARACT EXTRACTION  2010  . [place]  . KNEE SURGERY  2004   left knee  . TONSILLECTOMY AND ADENOIDECTOMY  1939    There were no vitals filed for this visit.      Subjective Assessment - 04/04/16 0936    Subjective Pt reports "it has been slow going" working on the [place] program at home and has not yet made it through all the activities.   Patient Stated Goals "No more falls."   Currently in Pain? No/denies                   [facility] Adult PT Treatment/Exercise - 04/04/16 0930      Knee/Hip Exercises: Aerobic   Nustep lvl 5 x 6'             Balance Exercises - 04/04/16 0930      OTAGO PROGRAM   Head Movements Standing;5 reps   Neck Movements Standing;5 reps   Back Extension Standing;5 reps   Trunk Movements Standing;5 reps   Ankle Movements Standing;10 reps   Knee Extensor 10 reps;Weight (comment)  2#   Knee Flexor 10 reps;Weight (comment)  2#   Hip ABductor 10 reps;Weight (comment)  2#   Ankle Plantorflexors 20 reps, support   Ankle Dorsiflexors 20 reps, support   Knee Bends 10 reps, no support   Backwards Walking Support   Walking and Turning Around No assistive device   Sideways Walking No assistive device   Tandem Stance 10 seconds, support  partially unsupported   Tandem Walk Support   One Leg Stand 10 seconds, support   Heel Walking Support   Toe Walk Support   Heel Toe Walking Backward --  Level C = single UE support on counter   Sit to Stand 10 reps, no support  PT Short Term Goals - 03/25/16 1412      PT SHORT TERM GOAL #1   Title Independent with initial strengthening HEP by 04/19/16   Status On-going     PT SHORT TERM GOAL #2   Title Verbalize understanding of safety precautions to decrease fall risk by 04/19/16   Status On-going     PT SHORT TERM GOAL #3   Title Improve score on FGA to >/= 19/30 to reduce risk for falls by 04/19/16   Status On-going           PT Long Term Goals - 03/25/16 1412      PT LONG TERM GOAL #1   Title Independent with advanced HEP for balance/LE strengthening by 05/17/16   Status On-going     PT LONG TERM GOAL #2   Title B LE strength 4+/5 or greater to improve stability by 05/17/16   Status On-going     PT LONG TERM GOAL #3   Title FGA >/= 22/30 to decrease risk for falls by 05/17/16   Status On-going     PT LONG TERM GOAL #4   Title Decrease difference in times on normal, manual and cogntive TUG to </= 1 sec  to reduce risk for falls by 05/17/16   Status On-going               Plan - 04/04/16 0943    Clinical Impression Statement OTAGO fall prevention program reviewed with minor corrections/clarifications provided but overall pt performing good return demonstration of activities. Will plan to defer OTAGO to HEP with periodic review at pt request or to progress activities as indicated. Future therapy sessions to focus on LE strengthening, proprioceptive training, balance on uneven surfaces, and dynamic gait activities to promote decreased fall risk.   Rehab Potential Good   Clinical Impairments Affecting Rehab Potential HTN (newly diagnosed with new medications), OA/DJD s/p unspecified L knee sx 2004, h/o incontinence   PT Treatment/Interventions Patient/family education;Therapeutic exercise;Neuromuscular re-education;Balance training;Therapeutic activities;Functional mobility training;Gait training;Stair training;ADLs/Self Care Home Management   PT Next Visit Plan Progress LE strengthening in standing as tol with focus on hips and ankles; Proprioceptive & balance training on uneven surfaces   Consulted and Agree with Plan of Care Patient      Patient will benefit from skilled therapeutic intervention in order to improve the following deficits and impairments:  Decreased balance, Decreased strength, Difficulty walking, Decreased activity tolerance  Visit Diagnosis: Unsteadiness on feet  History of falling  Muscle weakness (generalized)     Problem List Patient Active Problem List   Diagnosis Date Noted  . Recurrent falls 03/24/2016  . Tinnitus 07/24/2015  . Hypertension 07/24/2015  . CTS (carpal tunnel syndrome) 06/18/2015  . Nocturia 06/18/2015  . Dyspnea 06/14/2015  . Incontinence in female 02/06/2015  . History of chicken pox   . Hyperlipidemia, mixed 05/24/2014  . Osteoarthritis 05/29/2013  . 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 Tobacco use disorder  Quit smoking  35 years ago  11/16/2012  . Calcified granuloma of lung ([place]) 11/16/2012  . Sinus bradycardia on ECG 11/16/2012  . TMJ arthralgia 03/10/2012  . Hypothyroidism 10/07/2011  . Osteopenia  Dexa done 05/27/2014 normal  10/07/2011  . Diverticulosis of colon without hemorrhage 10/07/2011  . Vitamin D deficiency 10/07/2011  . GERD (gastroesophageal reflux disease) 10/07/2011  . Hearing loss 10/07/2011  . DJD (degenerative joint disease) 10/07/2011  . History of depression 10/07/2011  . Disc herniation 10/07/2011  . H/O hysterectomy with  oophorectomy 10/07/2011  . Family history of breast cancer in first degree relative 10/07/2011  . Thyroid cyst 10/07/2011    Percival Spanish, PT, MPT  04/04/2016, 10:36 AM  [facility] [address]  [place], [place], [address] Phone: [phone]   Fax:  [phone]  Name: Kylie Walters MRN: [id number] Date of Birth: 02/06/1925

## 2016-04-08 ENCOUNTER — Ambulatory Visit: Payer: Medicare Other | Admitting: Physical Therapy

## 2016-04-08 DIAGNOSIS — Z9181 History of falling: Secondary | ICD-10-CM | POA: Diagnosis not present

## 2016-04-08 DIAGNOSIS — R2681 Unsteadiness on feet: Secondary | ICD-10-CM

## 2016-04-08 DIAGNOSIS — M6281 Muscle weakness (generalized): Secondary | ICD-10-CM

## 2016-04-08 NOTE — Therapy (Signed)
[facility] [address]  [place], [place], [address] Phone: [phone]   Fax:  [phone]  Physical Therapy Treatment  Patient Details  Name: Kylie Walters MRN: [id number] Date of Birth: January 31, 1925 Referring Provider: Penni Homans, MD  Encounter Date: 04/08/2016      PT End of Session - 04/08/16 1314    Visit Number 7   Number of Visits 16   Date for PT Re-Evaluation 05/17/16   Authorization Type Medicare   PT Start Time 1310   PT Stop Time 1350   PT Time Calculation (min) 40 min   Activity Tolerance Patient tolerated treatment well   Behavior During Therapy [facility] for tasks assessed/performed      Past Medical History:  Diagnosis Date  . Arthritis   . CTS (carpal tunnel syndrome) 06/18/2015  . Depression 4/03  . Diverticulosis   . Diverticulosis of colon without hemorrhage 10/07/2011  . Elevated blood pressure 07/24/2015  . GERD (gastroesophageal reflux disease)   . Granuloma of skin    RLL  . H/O measles   . H/O mumps   . Hearing loss   . History of chicken pox   . Hyperlipidemia   . Hypertension 07/24/2015  . Hypothyroidism   . Incontinence in female 02/06/2015  . Lumbar herniated disc   . Nocturia 06/18/2015  . Osteopenia   . Tinnitus 07/24/2015  . Vitamin D deficiency     Past Surgical History:  Procedure Laterality Date  . ABDOMINAL HYSTERECTOMY  1980  . APPENDECTOMY    . BREAST SURGERY     L breast cyst  . CATARACT EXTRACTION  2010  . [place]  . KNEE SURGERY  2004   left knee  . TONSILLECTOMY AND ADENOIDECTOMY  1939    There were no vitals filed for this visit.      Subjective Assessment - 04/08/16 1309    Subjective Patient feeling well - has been doing OTAGO at home.    Patient Stated Goals "No more falls."   Currently in Pain? Yes   Pain Score 1    Pain Location Ankle   Pain Orientation Right   Pain Descriptors / Indicators Aching                          [facility] Adult PT Treatment/Exercise - 04/08/16 1320      Knee/Hip Exercises: Aerobic   Nustep level 5 x 7 minutes     Knee/Hip Exercises: Standing   Forward Step Up Both;10 reps;Step Height: 4"   Forward Step Up Limitations onto aerobic step with 1 blue mat     Knee/Hip Exercises: Seated   Long Arc Quad Strengthening;Both;15 reps;Weights   Long Arc Quad Weight 2 lbs.   Hamstring Curl Strengthening;Both;15 reps   Abduction/Adduction  Strengthening;Both;15 reps   Abd/Adduction Limitations adduction with ball squeeze             Balance Exercises - 04/08/16 1351      Balance Exercises: Standing   Standing Eyes Opened Narrow base of support (BOS);Foam/compliant surface;4 reps  CGA to Min A to maintain balance.   Tandem Stance Eyes open;2 reps  2 reps x 60 seconds each LE; R LE forward more difficult   Gait with Head Turns Forward  150' vertical; 150' horizontal; CGA             PT Short Term  Goals - 03/25/16 1412      PT SHORT TERM GOAL #1   Title Independent with initial strengthening HEP by 04/19/16   Status On-going     PT SHORT TERM GOAL #2   Title Verbalize understanding of safety precautions to decrease fall risk by 04/19/16   Status On-going     PT SHORT TERM GOAL #3   Title Improve score on FGA to >/= 19/30 to reduce risk for falls by 04/19/16   Status On-going           PT Long Term Goals - 03/25/16 1412      PT LONG TERM GOAL #1   Title Independent with advanced HEP for balance/LE strengthening by 05/17/16   Status On-going     PT LONG TERM GOAL #2   Title B LE strength 4+/5 or greater to improve stability by 05/17/16   Status On-going     PT LONG TERM GOAL #3   Title FGA >/= 22/30 to decrease risk for falls by 05/17/16   Status On-going     PT LONG TERM GOAL #4   Title Decrease difference in times on normal, manual and cogntive TUG to </= 1 sec to reduce risk for falls by 05/17/16   Status On-going                Plan - 04/08/16 1353    Clinical Impression Statement Patient today reporting good complinace with OTAGO balance program. Today session focusing on hip/knee strengthening as well as balace on compliant surfaces with narrow base of support and giat with vertical and horizontal head turns. Patient requiring 1 hand assist to achieve narrow base of support position wtih heavy ankle strategy used to maintain balance. Gait with vertical head turns slightly difficulty requiring CGA to Min A to maintain upright balance as patient demonstrated multiple LOBs without fall. Patient to continue to benefit form PT to maximize function.    Clinical Impairments Affecting Rehab Potential HTN (newly diagnosed with new medications), OA/DJD s/p unspecified L knee sx 2004, h/o incontinence   PT Treatment/Interventions Patient/family education;Therapeutic exercise;Neuromuscular re-education;Balance training;Therapeutic activities;Functional mobility training;Gait training;Stair training;ADLs/Self Care Home Management   PT Next Visit Plan Progress LE strengthening in standing as tol with focus on hips and ankles; Proprioceptive & balance training on uneven surfaces   Consulted and Agree with Plan of Care Patient      Patient will benefit from skilled therapeutic intervention in order to improve the following deficits and impairments:  Decreased balance, Decreased strength, Difficulty walking, Decreased activity tolerance  Visit Diagnosis: Unsteadiness on feet  History of falling  Muscle weakness (generalized)     Problem List Patient Active Problem List   Diagnosis Date Noted  . Recurrent falls 03/24/2016  . Tinnitus 07/24/2015  . Hypertension 07/24/2015  . CTS (carpal tunnel syndrome) 06/18/2015  . Nocturia 06/18/2015  . Dyspnea 06/14/2015  . Incontinence in female 02/06/2015  . History of chicken pox   . Hyperlipidemia, mixed 05/24/2014  . Osteoarthritis 05/29/2013  . IgG  monoclonal protein disorder 05/29/2013  . Tobacco use disorder  Quit smoking 35 years ago  11/16/2012  . Calcified granuloma of lung ([place]) 11/16/2012  . Sinus bradycardia on ECG 11/16/2012  . TMJ arthralgia 03/10/2012  . Hypothyroidism 10/07/2011  . Osteopenia  Dexa done 05/27/2014 normal  10/07/2011  . Diverticulosis of colon without hemorrhage 10/07/2011  . Vitamin D deficiency 10/07/2011  . GERD (gastroesophageal reflux disease) 10/07/2011  . Hearing loss 10/07/2011  .  DJD (degenerative joint disease) 10/07/2011  . History of depression 10/07/2011  . Disc herniation 10/07/2011  . H/O hysterectomy with oophorectomy 10/07/2011  . Family history of breast cancer in first degree relative 10/07/2011  . Thyroid cyst 10/07/2011     Lanney Gins, PT, DPT 04/08/16 1:58 PM    [facility] [address]  [place], [place], [address] Phone: [phone]   Fax:  [phone]  Name: SIDRA OLDFIELD MRN: [id number] Date of Birth: 06-07-1924

## 2016-04-11 ENCOUNTER — Ambulatory Visit: Payer: Medicare Other | Admitting: Physical Therapy

## 2016-04-11 DIAGNOSIS — Z9181 History of falling: Secondary | ICD-10-CM | POA: Diagnosis not present

## 2016-04-11 DIAGNOSIS — M6281 Muscle weakness (generalized): Secondary | ICD-10-CM

## 2016-04-11 DIAGNOSIS — R2681 Unsteadiness on feet: Secondary | ICD-10-CM | POA: Diagnosis not present

## 2016-04-11 NOTE — Patient Instructions (Signed)
Fall Prevention in the Home Falls can cause injuries and can affect people from all age groups. There are many simple things that you can do to make your home safe and to help prevent falls. What can I do on the outside of my home?  Regularly repair the edges of walkways and driveways and fix any cracks.  Remove high doorway thresholds.  Trim any shrubbery on the main path into your home.  Use bright outdoor lighting.  Clear walkways of debris and clutter, including tools and rocks.  Regularly check that handrails are securely fastened and in good repair. Both sides of any steps should have handrails.  Install guardrails along the edges of any raised decks or porches.  Have leaves, snow, and ice cleared regularly.  Use sand or salt on walkways during winter months.  In the garage, clean up any spills right away, including grease or oil spills. What can I do in the bathroom?  Use night lights.  Install grab bars by the toilet and in the tub and shower. Do not use towel bars as grab bars.  Use non-skid mats or decals on the floor of the tub or shower.  If you need to sit down while you are in the shower, use a plastic, non-slip stool.  Keep the floor dry. Immediately clean up any water that spills on the floor.  Remove soap buildup in the tub or shower on a regular basis.  Attach bath mats securely with double-sided non-slip rug tape.  Remove throw rugs and other tripping hazards from the floor. What can I do in the bedroom?  Use night lights.  Make sure that a bedside light is easy to reach.  Do not use oversized bedding that drapes onto the floor.  Have a firm chair that has side arms to use for getting dressed.  Remove throw rugs and other tripping hazards from the floor. What can I do in the kitchen?  Clean up any spills right away.  Avoid walking on wet floors.  Place frequently used items in easy-to-reach places.  If you need to reach for something above  you, use a sturdy step stool that has a grab bar.  Keep electrical cables out of the way.  Do not use floor polish or wax that makes floors slippery. If you have to use wax, make sure that it is non-skid floor wax.  Remove throw rugs and other tripping hazards from the floor. What can I do in the stairways?  Do not leave any items on the stairs.  Make sure that there are handrails on both sides of the stairs. Fix handrails that are broken or loose. Make sure that handrails are as long as the stairways.  Check any carpeting to make sure that it is firmly attached to the stairs. Fix any carpet that is loose or worn.  Avoid having throw rugs at the top or bottom of stairways, or secure the rugs with carpet tape to prevent them from moving.  Make sure that you have a light switch at the top of the stairs and the bottom of the stairs. If you do not have them, have them installed. What are some other fall prevention tips?  Wear closed-toe shoes that fit well and support your feet. Wear shoes that have rubber soles or low heels.  When you use a stepladder, make sure that it is completely opened and that the sides are firmly locked. Have someone hold the ladder while you are using   it. Do not climb a closed stepladder.  Add color or contrast paint or tape to grab bars and handrails in your home. Place contrasting color strips on the first and last steps.  Use mobility aids as needed, such as canes, walkers, scooters, and crutches.  Turn on lights if it is dark. Replace any light bulbs that burn out.  Set up furniture so that there are clear paths. Keep the furniture in the same spot.  Fix any uneven floor surfaces.  Choose a carpet design that does not hide the edge of steps of a stairway.  Be aware of any and all pets.  Review your medicines with your healthcare provider. Some medicines can cause dizziness or changes in blood pressure, which increase your risk of falling. Talk with  your health care provider about other ways that you can decrease your risk of falls. This may include working with a physical therapist or trainer to improve your strength, balance, and endurance. This information is not intended to replace advice given to you by your health care provider. Make sure you discuss any questions you have with your health care provider. Document Released: 03/29/2002 Document Revised: 09/05/2015 Document Reviewed: 05/13/2014 Elsevier Interactive Patient Education  2017 [employer] Inc[employer]  

## 2016-04-11 NOTE — Therapy (Signed)
[facility] [address]  [place], [place], [address] Phone: [phone]   Fax:  [phone]  Physical Therapy Treatment  Patient Details  Name: Kylie Walters MRN: [id number] Date of Birth: 1925/04/14 Referring Provider: Penni Homans, MD  Encounter Date: 04/11/2016      PT End of Session - 04/11/16 1400    Visit Number 8   Number of Visits 16   Date for PT Re-Evaluation 05/17/16   Authorization Type Medicare   PT Start Time 1400   PT Stop Time 1442   PT Time Calculation (min) 42 min   Activity Tolerance Patient tolerated treatment well   Behavior During Therapy [facility] for tasks assessed/performed      Past Medical History:  Diagnosis Date  . Arthritis   . CTS (carpal tunnel syndrome) 06/18/2015  . Depression 4/03  . Diverticulosis   . Diverticulosis of colon without hemorrhage 10/07/2011  . Elevated blood pressure 07/24/2015  . GERD (gastroesophageal reflux disease)   . Granuloma of skin    RLL  . H/O measles   . H/O mumps   . Hearing loss   . History of chicken pox   . Hyperlipidemia   . Hypertension 07/24/2015  . Hypothyroidism   . Incontinence in female 02/06/2015  . Lumbar herniated disc   . Nocturia 06/18/2015  . Osteopenia   . Tinnitus 07/24/2015  . Vitamin D deficiency     Past Surgical History:  Procedure Laterality Date  . ABDOMINAL HYSTERECTOMY  1980  . APPENDECTOMY    . BREAST SURGERY     L breast cyst  . CATARACT EXTRACTION  2010  . [place]  . KNEE SURGERY  2004   left knee  . TONSILLECTOMY AND ADENOIDECTOMY  1939    There were no vitals filed for this visit.      Subjective Assessment - 04/11/16 1402    Subjective Pt reports ankle pain from earlier this week has resolved.   Patient Stated Goals "No more falls."   Currently in Pain? No/denies            [facility] PT Assessment - 04/11/16 1400      Assessment   Medical Diagnosis  Recurrent falls   Referring Provider Penni Homans, MD   Onset Date/Surgical Date 02/25/16   Next MD Visit 05/30/16     Strength   Right Hip Flexion 4/5   Right Hip Extension 4/5   Right Hip External Rotation  4/5   Right Hip Internal Rotation 4+/5   Right Hip ABduction 4+/5   Right Hip ADduction 4+/5   Left Hip Flexion 4/5   Left Hip Extension 4/5   Left Hip External Rotation 4/5   Left Hip Internal Rotation 4+/5   Left Hip ABduction 4+/5   Left Hip ADduction 4+/5   Right Knee Flexion 5/5   Right Knee Extension 5/5   Left Knee Flexion 5/5   Left Knee Extension 5/5   Right Ankle Dorsiflexion 4+/5   Right Ankle Plantar Flexion 5/5   Right Ankle Inversion 4+/5   Right Ankle Eversion 4+/5   Left Ankle Dorsiflexion 4+/5   Left Ankle Plantar Flexion 5/5   Left Ankle Inversion 4+/5   Left Ankle Eversion 4+/5     Standardized Balance Assessment   Five times sit to stand comments  11.41   10 Meter Walk 10.47 sec - 3.13 ft/sec  Timed Up and Go Test   TUG Normal TUG;Manual TUG;Cognitive TUG   Normal TUG (seconds) 9.41   Manual TUG (seconds) 10.12   Cognitive TUG (seconds) 10.31     Functional Gait  Assessment   Gait Level Surface Walks 20 ft in less than 7 sec but greater than 5.5 sec, uses assistive device, slower speed, mild gait deviations, or deviates 6-10 in outside of the 12 in walkway width.   Change in Gait Speed Able to smoothly change walking speed without loss of balance or gait deviation. Deviate no more than 6 in outside of the 12 in walkway width.   Gait with Horizontal Head Turns Performs head turns smoothly with no change in gait. Deviates no more than 6 in outside 12 in walkway width   Gait with Vertical Head Turns Performs head turns with no change in gait. Deviates no more than 6 in outside 12 in walkway width.   Gait and Pivot Turn Pivot turns safely in greater than 3 sec and stops with no loss of balance, or pivot turns safely within 3 sec and stops with mild  imbalance, requires small steps to catch balance.   Step Over Obstacle Is able to step over one shoe box (4.5 in total height) but must slow down and adjust steps to clear box safely. May require verbal cueing.   Gait with Narrow Base of Support Ambulates 4-7 steps.   Gait with Eyes Closed Walks 20 ft, slow speed, abnormal gait pattern, evidence for imbalance, deviates 10-15 in outside 12 in walkway width. Requires more than 9 sec to ambulate 20 ft.   Ambulating Backwards Walks 20 ft, slow speed, abnormal gait pattern, evidence for imbalance, deviates 10-15 in outside 12 in walkway width.   Steps Alternating feet, must use rail.   Total Score 19                     [facility] Adult PT Treatment/Exercise - 04/11/16 1400      Knee/Hip Exercises: Aerobic   Recumbent Bike lvl 2 x 6'                  PT Short Term Goals - 04/11/16 1405      PT SHORT TERM GOAL #1   Title Independent with initial strengthening HEP by 04/19/16   Status Achieved     PT SHORT TERM GOAL #2   Title Verbalize understanding of safety precautions to decrease fall risk by 04/19/16   Status On-going     PT SHORT TERM GOAL #3   Title Improve score on FGA to >/= 19/30 to reduce risk for falls by 04/19/16   Status Achieved           PT Long Term Goals - 04/11/16 1406      PT LONG TERM GOAL #1   Title Independent with advanced HEP for balance/LE strengthening by 05/17/16   Status Partially Met  Currently performing OTAGO Fall Prevention Program     PT LONG TERM GOAL #2   Title B LE strength 4+/5 or greater to improve stability by 05/17/16   Status Partially Met  Met except B hip flexion, extension & ER     PT LONG TERM GOAL #3   Title FGA >/= 22/30 to decrease risk for falls by 05/17/16   Status On-going     PT LONG TERM GOAL #4   Title Decrease difference in times on normal, manual and cogntive TUG to </= 1  sec to reduce risk for falls by 05/17/16   Status Achieved                Plan - 04/11/16 1406    Clinical Impression Statement Pt inquiring about readiness to stop PT, therefore assessed MMT, standardized tests and goal status. Pt has met all STG's and is demonstrating good progress toward LTG's but continues to have deficits with dynamic balance and gait as well as reports continued lack of confidence on uneven surfaces. Pt has 8 visits remaining in current POC and recommended continued PT to address these deficits and decrease risk for futher falls, with pt in agreement.   Clinical Impairments Affecting Rehab Potential HTN (newly diagnosed with new medications), OA/DJD s/p unspecified L knee sx 2004, h/o incontinence   PT Treatment/Interventions Patient/family education;Therapeutic exercise;Neuromuscular re-education;Balance training;Therapeutic activities;Functional mobility training;Gait training;Stair training;ADLs/Self Care Home Management   PT Next Visit Plan Progress LE strengthening in standing as tol with focus on hips and ankles; Proprioceptive & balance training on uneven surfaces   Consulted and Agree with Plan of Care Patient      Patient will benefit from skilled therapeutic intervention in order to improve the following deficits and impairments:  Decreased balance, Decreased strength, Difficulty walking, Decreased activity tolerance  Visit Diagnosis: Unsteadiness on feet  History of falling  Muscle weakness (generalized)     Problem List Patient Active Problem List   Diagnosis Date Noted  . Recurrent falls 03/24/2016  . Tinnitus 07/24/2015  . Hypertension 07/24/2015  . CTS (carpal tunnel syndrome) 06/18/2015  . Nocturia 06/18/2015  . Dyspnea 06/14/2015  . Incontinence in female 02/06/2015  . History of chicken pox   . Hyperlipidemia, mixed 05/24/2014  . Osteoarthritis 05/29/2013  . 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 Tobacco use disorder  Quit smoking 35 years ago  11/16/2012  . Calcified granuloma of lung ([place])  11/16/2012  . Sinus bradycardia on ECG 11/16/2012  . TMJ arthralgia 03/10/2012  . Hypothyroidism 10/07/2011  . Osteopenia  Dexa done 05/27/2014 normal  10/07/2011  . Diverticulosis of colon without hemorrhage 10/07/2011  . Vitamin D deficiency 10/07/2011  . GERD (gastroesophageal reflux disease) 10/07/2011  . Hearing loss 10/07/2011  . DJD (degenerative joint disease) 10/07/2011  . History of depression 10/07/2011  . Disc herniation 10/07/2011  . H/O hysterectomy with oophorectomy 10/07/2011  . Family history of breast cancer in first degree relative 10/07/2011  . Thyroid cyst 10/07/2011    Percival Spanish, PT, MPT 04/11/2016, 2:50 PM  [facility] [address]  [place], [place], [address] Phone: [phone]   Fax:  [phone]  Name: Kylie Walters MRN: [id number] Date of Birth: October 03, 1924

## 2016-04-17 ENCOUNTER — Ambulatory Visit: Payer: Medicare Other

## 2016-04-17 DIAGNOSIS — M6281 Muscle weakness (generalized): Secondary | ICD-10-CM | POA: Diagnosis not present

## 2016-04-17 DIAGNOSIS — Z9181 History of falling: Secondary | ICD-10-CM

## 2016-04-17 DIAGNOSIS — R2681 Unsteadiness on feet: Secondary | ICD-10-CM

## 2016-04-17 NOTE — Therapy (Signed)
[facility] [address]  [place], [place], [address] Phone: [phone]   Fax:  [phone]  Physical Therapy Treatment  Patient Details  Name: Kylie Walters MRN: [id number] Date of Birth: 02-02-1925 Referring Provider: Penni Homans, MD  Encounter Date: 04/17/2016      PT End of Session - 04/17/16 1408    Visit Number 9   Number of Visits 16   Date for PT Re-Evaluation 05/17/16   Authorization Type Medicare   PT Start Time 5102   PT Stop Time 1443   PT Time Calculation (min) 40 min   Activity Tolerance Patient tolerated treatment well   Behavior During Therapy [facility] for tasks assessed/performed      Past Medical History:  Diagnosis Date  . Arthritis   . CTS (carpal tunnel syndrome) 06/18/2015  . Depression 4/03  . Diverticulosis   . Diverticulosis of colon without hemorrhage 10/07/2011  . Elevated blood pressure 07/24/2015  . GERD (gastroesophageal reflux disease)   . Granuloma of skin    RLL  . H/O measles   . H/O mumps   . Hearing loss   . History of chicken pox   . Hyperlipidemia   . Hypertension 07/24/2015  . Hypothyroidism   . Incontinence in female 02/06/2015  . Lumbar herniated disc   . Nocturia 06/18/2015  . Osteopenia   . Tinnitus 07/24/2015  . Vitamin D deficiency     Past Surgical History:  Procedure Laterality Date  . ABDOMINAL HYSTERECTOMY  1980  . APPENDECTOMY    . BREAST SURGERY     L breast cyst  . CATARACT EXTRACTION  2010  . [place]  . KNEE SURGERY  2004   left knee  . TONSILLECTOMY AND ADENOIDECTOMY  1939    There were no vitals filed for this visit.      Subjective Assessment - 04/17/16 1405    Subjective Pt. reporting she intends to participate in new balance class they are offering in her community.     Patient Stated Goals "No more falls."   Currently in Pain? No/denies   Pain Score 0-No pain   Multiple Pain Sites No                [facility] Adult PT Treatment/Exercise - 04/17/16 1425      Neuro Re-ed    Neuro Re-ed Details  Alternating cone toe-touch 2 x 7 reps x 3 trials; ambulation with diagonal head turns x 3 laps around gym track with close supervision from therapist     Knee/Hip Exercises: Aerobic   Recumbent Bike lvl 3 x 6'     Knee/Hip Exercises: Standing   Other Standing Knee Exercises Staggered stance wt. shift on 2 airex pads with forward<>back wt. shift x 20 reps each way   HH support from therapist    Other Standing Knee Exercises Standing march on blue foam airex pad x 20 reps; supervision from therapist            PT Short Term Goals - 04/11/16 1405      PT SHORT TERM GOAL #1   Title Independent with initial strengthening HEP by 04/19/16   Status Achieved     PT SHORT TERM GOAL #2   Title Verbalize understanding of safety precautions to decrease fall risk by 04/19/16   Status On-going     PT SHORT TERM GOAL #3   Title Improve score  on FGA to >/= 19/30 to reduce risk for falls by 04/19/16   Status Achieved           PT Long Term Goals - 04/11/16 1406      PT LONG TERM GOAL #1   Title Independent with advanced HEP for balance/LE strengthening by 05/17/16   Status Partially Met  Currently performing OTAGO Fall Prevention Program     PT LONG TERM GOAL #2   Title B LE strength 4+/5 or greater to improve stability by 05/17/16   Status Partially Met  Met except B hip flexion, extension & ER     PT LONG TERM GOAL #3   Title FGA >/= 22/30 to decrease risk for falls by 05/17/16   Status On-going     PT LONG TERM GOAL #4   Title Decrease difference in times on normal, manual and cogntive TUG to </= 1 sec to reduce risk for falls by 05/17/16   Status Achieved               Plan - 04/17/16 1423    Clinical Impression Statement Today's treatment focused on dynamic gait activities and static balance activities on uneven surfaces.  Pt. performing well with static,  proprioception activities however unable to maintain consistent pace or gait path with diagonal head turns.  Pt. reporting she plans to participate in new balance program being offered in her community in coming weeks.  Pt. will continue to benefit from skilled therapy to strengthening LE, improve proprioception, and decrease fall risk.     PT Treatment/Interventions Patient/family education;Therapeutic exercise;Neuromuscular re-education;Balance training;Therapeutic activities;Functional mobility training;Gait training;Stair training;ADLs/Self Care Home Management   PT Next Visit Plan 10th VISIT; FOTO; G-code; Progress LE strengthening in standing as tol with focus on hips and ankles; Proprioceptive & balance training on uneven surfaces      Patient will benefit from skilled therapeutic intervention in order to improve the following deficits and impairments:  Decreased balance, Decreased strength, Difficulty walking, Decreased activity tolerance  Visit Diagnosis: Unsteadiness on feet  History of falling  Muscle weakness (generalized)     Problem List Patient Active Problem List   Diagnosis Date Noted  . Recurrent falls 03/24/2016  . Tinnitus 07/24/2015  . Hypertension 07/24/2015  . CTS (carpal tunnel syndrome) 06/18/2015  . Nocturia 06/18/2015  . Dyspnea 06/14/2015  . Incontinence in female 02/06/2015  . History of chicken pox   . Hyperlipidemia, mixed 05/24/2014  . Osteoarthritis 05/29/2013  . 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 Tobacco use disorder  Quit smoking 35 years ago  11/16/2012  . Calcified granuloma of lung ([place]) 11/16/2012  . Sinus bradycardia on ECG 11/16/2012  . TMJ arthralgia 03/10/2012  . Hypothyroidism 10/07/2011  . Osteopenia  Dexa done 05/27/2014 normal  10/07/2011  . Diverticulosis of colon without hemorrhage 10/07/2011  . Vitamin D deficiency 10/07/2011  . GERD (gastroesophageal reflux disease) 10/07/2011  . Hearing loss 10/07/2011  . DJD  (degenerative joint disease) 10/07/2011  . History of depression 10/07/2011  . Disc herniation 10/07/2011  . H/O hysterectomy with oophorectomy 10/07/2011  . Family history of breast cancer in first degree relative 10/07/2011  . Thyroid cyst 10/07/2011    Bess Harvest, PTA 04/17/16 3:19 PM  [facility] [address]  [place], [place], [address] Phone: [phone]   Fax:  [phone]  Name: Kylie Walters MRN: [id number] Date of Birth: 09-Feb-1925

## 2016-04-23 ENCOUNTER — Ambulatory Visit: Payer: Medicare Other | Attending: Family Medicine | Admitting: Physical Therapy

## 2016-04-23 DIAGNOSIS — Z9181 History of falling: Secondary | ICD-10-CM | POA: Diagnosis not present

## 2016-04-23 DIAGNOSIS — R2681 Unsteadiness on feet: Secondary | ICD-10-CM | POA: Insufficient documentation

## 2016-04-23 DIAGNOSIS — M6281 Muscle weakness (generalized): Secondary | ICD-10-CM | POA: Diagnosis not present

## 2016-04-23 NOTE — Therapy (Signed)
[facility] [address]  [place], [place], [address] Phone: [phone]   Fax:  [phone]  Physical Therapy Treatment  Patient Details  Name: Kylie Walters MRN: [id number] Date of Birth: Mar 28, 1925 Referring Provider: Penni Homans, MD  Encounter Date: 04/23/2016      PT End of Session - 04/23/16 1405    Visit Number 10   Number of Visits 16   Date for PT Re-Evaluation 05/17/16   Authorization Type Medicare   PT Start Time 1400   PT Stop Time 1439   PT Time Calculation (min) 39 min   Activity Tolerance Patient tolerated treatment well   Behavior During Therapy [facility] for tasks assessed/performed      Past Medical History:  Diagnosis Date  . Arthritis   . CTS (carpal tunnel syndrome) 06/18/2015  . Depression 4/03  . Diverticulosis   . Diverticulosis of colon without hemorrhage 10/07/2011  . Elevated blood pressure 07/24/2015  . GERD (gastroesophageal reflux disease)   . Granuloma of skin    RLL  . H/O measles   . H/O mumps   . Hearing loss   . History of chicken pox   . Hyperlipidemia   . Hypertension 07/24/2015  . Hypothyroidism   . Incontinence in female 02/06/2015  . Lumbar herniated disc   . Nocturia 06/18/2015  . Osteopenia   . Tinnitus 07/24/2015  . Vitamin D deficiency     Past Surgical History:  Procedure Laterality Date  . ABDOMINAL HYSTERECTOMY  1980  . APPENDECTOMY    . BREAST SURGERY     L breast cyst  . CATARACT EXTRACTION  2010  . [place]  . KNEE SURGERY  2004   left knee  . TONSILLECTOMY AND ADENOIDECTOMY  1939    There were no vitals filed for this visit.      Subjective Assessment - 04/23/16 1403    Subjective Pt reports she started participating in a 30 minute balance program at [facility] and other than feeling uncooridnated, tolerated the program well.   Patient Stated Goals "No more falls."   Currently in Pain? No/denies             [facility] PT Assessment - 04/23/16 1400      Assessment   Medical Diagnosis Recurrent falls   Referring Provider Penni Homans, MD   Onset Date/Surgical Date 02/25/16   Next MD Visit 05/30/16     Observation/Other Assessments   Focus on Therapeutic Outcomes (FOTO)  Neuromuscular disorder 92% (8% limitation)     Strength   Right Hip Flexion 4/5   Right Hip Extension 4+/5   Right Hip External Rotation  4+/5   Right Hip Internal Rotation 4+/5   Right Hip ABduction 5/5   Right Hip ADduction 5/5   Left Hip Flexion 4/5   Left Hip Extension 4+/5   Left Hip External Rotation 4+/5   Left Hip Internal Rotation 4+/5   Left Hip ABduction 5/5   Left Hip ADduction 5/5   Right Knee Flexion 5/5   Right Knee Extension 5/5   Left Knee Flexion 5/5   Left Knee Extension 5/5   Right Ankle Dorsiflexion 4+/5   Right Ankle Plantar Flexion 5/5   Right Ankle Inversion 4+/5   Right Ankle Eversion 4+/5   Left Ankle Dorsiflexion 4+/5   Left Ankle Plantar Flexion 5/5   Left Ankle Inversion 4+/5   Left Ankle Eversion 4+/5  Ambulation/Gait   Gait velocity 3.84 ft/sec  8.54"     Functional Gait  Assessment   Gait Level Surface Walks 20 ft in less than 5.5 sec, no assistive devices, good speed, no evidence for imbalance, normal gait pattern, deviates no more than 6 in outside of the 12 in walkway width.   Change in Gait Speed Able to smoothly change walking speed without loss of balance or gait deviation. Deviate no more than 6 in outside of the 12 in walkway width.   Gait with Horizontal Head Turns Performs head turns smoothly with no change in gait. Deviates no more than 6 in outside 12 in walkway width   Gait with Vertical Head Turns Performs head turns with no change in gait. Deviates no more than 6 in outside 12 in walkway width.   Gait and Pivot Turn Pivot turns safely within 3 sec and stops quickly with no loss of balance.   Step Over Obstacle Is able to step over one shoe box (4.5 in  total height) without changing gait speed. No evidence of imbalance.   Gait with Narrow Base of Support Ambulates 7-9 steps.   Gait with Eyes Closed Walks 20 ft, slow speed, abnormal gait pattern, evidence for imbalance, deviates 10-15 in outside 12 in walkway width. Requires more than 9 sec to ambulate 20 ft.   Ambulating Backwards Walks 20 ft, uses assistive device, slower speed, mild gait deviations, deviates 6-10 in outside 12 in walkway width.   Steps Alternating feet, must use rail.   Total Score 24                               PT Short Term Goals - 04/23/16 1406      PT SHORT TERM GOAL #1   Title Independent with initial strengthening HEP by 04/19/16   Status Achieved     PT SHORT TERM GOAL #2   Title Verbalize understanding of safety precautions to decrease fall risk by 04/19/16   Status Achieved     PT SHORT TERM GOAL #3   Title Improve score on FGA to >/= 19/30 to reduce risk for falls by 04/19/16   Status Achieved           PT Long Term Goals - 04/23/16 1407      PT LONG TERM GOAL #1   Title Independent with advanced HEP for balance/LE strengthening by 05/17/16   Status Achieved     PT LONG TERM GOAL #2   Title B LE strength 4+/5 or greater to improve stability by 05/17/16   Status Partially Met  Met except B hip flexion     PT LONG TERM GOAL #3   Title FGA >/= 22/30 to decrease risk for falls by 05/17/16   Status Achieved     PT LONG TERM GOAL #4   Title Decrease difference in times on normal, manual and cogntive TUG to </= 1 sec to reduce risk for falls by 05/17/16   Status Achieved               Plan - 04/23/16 1439    Clinical Impression Statement Pt reporting she has started a balance exercise program at [facility] and feels like if she keeps up with this and the OTAGO HEP that she no longer needs to come to PT. MMT revealed strength improved to >/= 4+/5 for alll LE muscles except B hip flexion. FGA has improved  to 24/30 with  pt no longer in the high fall risk category. All goals met except B hip flexion strength, therefore per pt's preference will proceed with discharge from PT.   PT Treatment/Interventions Patient/family education;Therapeutic exercise;Neuromuscular re-education;Balance training;Therapeutic activities;Functional mobility training;Gait training;Stair training;ADLs/Self Care Home Management   PT Next Visit Plan Discharge   Consulted and Agree with Plan of Care Patient      Patient will benefit from skilled therapeutic intervention in order to improve the following deficits and impairments:  Decreased balance, Decreased strength, Difficulty walking, Decreased activity tolerance  Visit Diagnosis: Unsteadiness on feet  History of falling  Muscle weakness (generalized)       G-Codes - 2016-05-07 1444    Functional Assessment Tool Used FGA = 24/30 (46.7% impaired)   Functional Limitation Mobility: Walking and moving around   Mobility: Walking and Moving Around Goal Status [phone]) At least 20 percent but less than 40 percent impaired, limited or restricted   Mobility: Walking and Moving Around Discharge Status [phone]) At least 20 percent but less than 40 percent impaired, limited or restricted      Problem List Patient Active Problem List   Diagnosis Date Noted  . Recurrent falls 03/24/2016  . Tinnitus 07/24/2015  . Hypertension 07/24/2015  . CTS (carpal tunnel syndrome) 06/18/2015  . Nocturia 06/18/2015  . Dyspnea 06/14/2015  . Incontinence in female 02/06/2015  . History of chicken pox   . Hyperlipidemia, mixed 05/24/2014  . Osteoarthritis 05/29/2013  . 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 Tobacco use disorder  Quit smoking 35 years ago  11/16/2012  . Calcified granuloma of lung ([place]) 11/16/2012  . Sinus bradycardia on ECG 11/16/2012  . TMJ arthralgia 03/10/2012  . Hypothyroidism 10/07/2011  . Osteopenia  Dexa done 05/27/2014 normal  10/07/2011  . Diverticulosis of colon  without hemorrhage 10/07/2011  . Vitamin D deficiency 10/07/2011  . GERD (gastroesophageal reflux disease) 10/07/2011  . Hearing loss 10/07/2011  . DJD (degenerative joint disease) 10/07/2011  . History of depression 10/07/2011  . Disc herniation 10/07/2011  . H/O hysterectomy with oophorectomy 10/07/2011  . Family history of breast cancer in first degree relative 10/07/2011  . Thyroid cyst 10/07/2011    Percival Spanish, PT, MPT 05/07/16, 2:46 PM  [facility] [address]  [place], [place], [address] Phone: [phone]   Fax:  [phone]  Name: Kylie Walters MRN: [id number] Date of Birth: 1924-07-02   PHYSICAL THERAPY DISCHARGE SUMMARY  Visits from Start of Care: 10  Current functional level related to goals / functional outcomes:   Refer to above clinical impression.   Remaining deficits:   As above.   Education / Equipment:   HEP & OTAGO Fall Prevention Program  Plan: Patient agrees to discharge.  Patient goals were partially met. Patient is being discharged due to not returning since the last visit.  ?????    Percival Spanish, PT, MPT 05-07-16, 4:01 PM  [facility] [address]  [place], [place], [address] Phone: [phone]   Fax:  [phone]

## 2016-04-25 ENCOUNTER — Ambulatory Visit: Payer: Medicare Other | Admitting: Physical Therapy

## 2016-04-29 ENCOUNTER — Ambulatory Visit: Payer: Medicare Other | Admitting: Physical Therapy

## 2016-04-30 ENCOUNTER — Ambulatory Visit: Payer: Medicare Other | Admitting: Physical Therapy

## 2016-05-02 ENCOUNTER — Ambulatory Visit: Payer: Medicare Other | Admitting: Physical Therapy

## 2016-05-06 ENCOUNTER — Ambulatory Visit: Payer: Medicare Other | Admitting: Physical Therapy

## 2016-05-07 ENCOUNTER — Ambulatory Visit: Payer: Medicare Other | Admitting: Physical Therapy

## 2016-05-09 ENCOUNTER — Ambulatory Visit: Payer: Medicare Other | Admitting: Physical Therapy

## 2016-05-09 ENCOUNTER — Ambulatory Visit: Payer: Medicare Other | Admitting: *Deleted

## 2016-05-13 ENCOUNTER — Other Ambulatory Visit: Payer: Self-pay | Admitting: Family Medicine

## 2016-05-13 ENCOUNTER — Ambulatory Visit: Payer: Medicare Other | Admitting: Physical Therapy

## 2016-05-13 MED ORDER — LEVOTHYROXINE SODIUM 75 MCG PO TABS: 75.0000 ug | ORAL_TABLET | Freq: Every day | ORAL | 5 refills | Status: DC

## 2016-05-14 ENCOUNTER — Ambulatory Visit: Payer: Medicare Other | Admitting: Physical Therapy

## 2016-05-16 ENCOUNTER — Ambulatory Visit: Payer: Medicare Other | Admitting: Physical Therapy

## 2016-05-20 ENCOUNTER — Encounter: Payer: Medicare Other | Admitting: Physical Therapy

## 2016-05-21 ENCOUNTER — Ambulatory Visit: Payer: Medicare Other | Admitting: Physical Therapy

## 2016-05-24 DIAGNOSIS — B351 Tinea unguium: Secondary | ICD-10-CM | POA: Diagnosis not present

## 2016-05-24 DIAGNOSIS — M79674 Pain in right toe(s): Secondary | ICD-10-CM | POA: Diagnosis not present

## 2016-05-24 DIAGNOSIS — M79675 Pain in left toe(s): Secondary | ICD-10-CM | POA: Diagnosis not present

## 2016-05-30 ENCOUNTER — Encounter: Payer: Self-pay | Admitting: Family Medicine

## 2016-05-30 ENCOUNTER — Ambulatory Visit (INDEPENDENT_AMBULATORY_CARE_PROVIDER_SITE_OTHER): Payer: Medicare Other | Admitting: Family Medicine

## 2016-05-30 VITALS — BP 136/78 | HR 64 | Temp 97.8°F | Wt 157.4 lb

## 2016-05-30 DIAGNOSIS — E782 Mixed hyperlipidemia: Secondary | ICD-10-CM

## 2016-05-30 DIAGNOSIS — E039 Hypothyroidism, unspecified: Secondary | ICD-10-CM

## 2016-05-30 DIAGNOSIS — E559 Vitamin D deficiency, unspecified: Secondary | ICD-10-CM | POA: Diagnosis not present

## 2016-05-30 DIAGNOSIS — Z0000 Encounter for general adult medical examination without abnormal findings: Secondary | ICD-10-CM

## 2016-05-30 DIAGNOSIS — I10 Essential (primary) hypertension: Secondary | ICD-10-CM

## 2016-05-30 HISTORY — DX: Encounter for general adult medical examination without abnormal findings: Z00.00

## 2016-05-30 LAB — VITAMIN D 25 HYDROXY (VIT D DEFICIENCY, FRACTURES): VITD: 21.14 ng/mL — AB (ref 30.00–100.00)

## 2016-05-30 LAB — COMPREHENSIVE METABOLIC PANEL
ALT: 13 U/L (ref 0–35)
AST: 18 U/L (ref 0–37)
Albumin: 4 g/dL (ref 3.5–5.2)
Alkaline Phosphatase: 61 U/L (ref 39–117)
BUN: 12 mg/dL (ref 6–23)
CALCIUM: 9.7 mg/dL (ref 8.4–10.5)
CHLORIDE: 108 meq/L (ref 96–112)
CO2: 31 meq/L (ref 19–32)
CREATININE: 0.68 mg/dL (ref 0.40–1.20)
GFR: 86.42 mL/min (ref >60.00)
GLUCOSE: 100 mg/dL — AB (ref 70–99)
Potassium: 4.7 mEq/L (ref 3.5–5.1)
SODIUM: 141 meq/L (ref 135–145)
Total Bilirubin: 0.5 mg/dL (ref 0.2–1.2)
Total Protein: 6.6 g/dL (ref 6.0–8.3)

## 2016-05-30 LAB — LIPID PANEL
CHOL/HDL RATIO: 4
CHOLESTEROL: 206 mg/dL — AB (ref 0–200)
HDL: 54.4 mg/dL (ref >39.00)
LDL CALC: 132 mg/dL — AB (ref 0–99)
NonHDL: 151.1
Triglycerides: 96 mg/dL (ref 0.0–149.0)
VLDL: 19.2 mg/dL (ref 0.0–40.0)

## 2016-05-30 LAB — CBC
HCT: 43 % (ref 36.0–46.0)
Hemoglobin: 14 g/dL (ref 12.0–15.0)
MCHC: 32.7 g/dL (ref 30.0–36.0)
MCV: 92.2 fl (ref 78.0–100.0)
Platelets: 244 10*3/uL (ref 150.0–400.0)
RBC: 4.67 Mil/uL (ref 3.87–5.11)
RDW: 13.7 % (ref 11.5–15.5)
WBC: 6.1 10*3/uL (ref 4.0–10.5)

## 2016-05-30 LAB — TSH: TSH: 1.56 u[IU]/mL (ref 0.35–4.50)

## 2016-05-30 MED ORDER — LISINOPRIL 10 MG PO TABS: 5.0000 mg | ORAL_TABLET | Freq: Every day | ORAL | 5 refills | Status: DC

## 2016-05-30 MED ORDER — LEVOTHYROXINE SODIUM 75 MCG PO TABS: 75.0000 ug | ORAL_TABLET | Freq: Every day | ORAL | 5 refills | Status: DC

## 2016-05-30 NOTE — Progress Notes (Addendum)
Patient ID: Kylie Walters, female   DOB: 09-Oct-1924, 83 y.o.   MRN: [id number]   Subjective:    Patient ID: Kylie Walters, female    DOB: Mar 11, 1925, 83 y.o.   MRN: [id number]  Chief Complaint  Patient presents with  . Hypothyroidism  . Hypertension  I acted as a Education administrator for Dr. Charlett Blake. Princess, RMA   Hypertension  The current episode started more than 1 year ago. The problem is unchanged. The problem is controlled. Associated symptoms include headaches. Pertinent negatives include no blurred vision, chest pain, malaise/fatigue, palpitations or shortness of breath. The current treatment provides mild improvement.    Patient is in today for medicare well visit, following up on   hypothyroidism, hypertension and other medical conditions patient has no acute concerns. She feels well. Has noted an improvement in incontinence since therapy can now go 2 hours without an episode. Otherwise she is doing well with ADLs, she has no acute concerns and no recent hospitalizations. Denies CP/palp/SO/fevers/GI or GU c/o. Taking meds as prescribed. She does note some clear rhinorrhea at times and nasal congestion with associated frontal Headache at times. Reports good BP control at home  Past Medical History:  Diagnosis Date  . Arthritis   . CTS (carpal tunnel syndrome) 06/18/2015  . Depression 4/03  . Diverticulosis   . Diverticulosis of colon without hemorrhage 10/07/2011  . Elevated blood pressure 07/24/2015  . GERD (gastroesophageal reflux disease)   . Granuloma of skin    RLL  . H/O measles   . H/O mumps   . Hearing loss   . History of chicken pox   . Hyperlipidemia   . Hypertension 07/24/2015  . Hypothyroidism   . Incontinence in female 02/06/2015  . Lumbar herniated disc   . Medicare annual wellness visit, subsequent 05/30/2016  . Nocturia 06/18/2015  . Osteopenia   . Tinnitus 07/24/2015  . Vitamin D deficiency     Past Surgical History:  Procedure Laterality Date  . ABDOMINAL HYSTERECTOMY   1980  . APPENDECTOMY    . BREAST SURGERY     L breast cyst  . CATARACT EXTRACTION  2010  . [place]  . KNEE SURGERY  2004   left knee  . TONSILLECTOMY AND ADENOIDECTOMY  1939    Family History  Problem Relation Age of Onset  . Breast cancer Mother     metastatic disease  . Heart disease Mother     Atrial fibrillation  . Kidney disease Father     Bright's disease  . Cancer Maternal Grandfather     prostate  . Obesity Daughter   . Thyroid disease Son   . Hyperlipidemia Son     Social History   Social History  . Marital status: Widowed    Spouse name: N/A  . Number of children: 3  . Years of education: N/A   Occupational History  . Not on file.   Social History Main Topics  . Smoking status: Former Smoker    Packs/day: 0.50    Years: 40.00    Types: Cigarettes    Start date: 06/09/1940    Quit date: 04/23/1979  . Smokeless tobacco: Never Used     Comment: quit smoking 40 years ago  . Alcohol use 0.0 oz/week     Comment: socially  . Drug use: No  . Sexual activity: Not Currently    Birth control/ protection: Post-menopausal     Comment: moved to  [place], widowed. no dietary restrictions   Other Topics Concern  . Not on file   Social History Narrative  . No narrative on file    Outpatient Medications Prior to Visit  Medication Sig Dispense Refill  . aspirin EC 81 MG tablet Take 81 mg by mouth daily.    . calcium-vitamin D (OSCAL WITH D) 500-200 MG-UNIT per tablet Take 1 tablet by mouth.    Marland Kitchen KRILL OIL PO Take by mouth daily.    . psyllium (METAMUCIL) 58.6 % packet Take 1 packet by mouth daily.    Marland Kitchen senna (SENOKOT) 8.6 MG tablet Take 2 tablets by mouth as needed for constipation.    Marland Kitchen levothyroxine (SYNTHROID, LEVOTHROID) 75 MCG tablet Take 1 tablet (75 mcg total) by mouth daily. 30 tablet 5  . lisinopril (PRINIVIL,ZESTRIL) 10 MG tablet Take 0.5 tablets (5 mg total) by mouth daily. 15 tablet 5  . oxyCODONE  (ROXICODONE) 5 MG immediate release tablet Take 0.5 tablets (2.5 mg total) by mouth every 4 (four) hours as needed for severe pain. 10 tablet 0  . HYDROcodone-homatropine (HYDROMET) 5-1.5 MG/5ML syrup Take 5 mLs by mouth every 6 (six) hours as needed for cough. (Patient not taking: Reported on 03/19/2016) 120 mL 0   No facility-administered medications prior to visit.     Allergies  Allergen Reactions  . Latex Itching  . Lipitor [Atorvastatin Calcium] Other (See Comments)    Myalgia     Review of Systems  Constitutional: Negative for fever and malaise/fatigue.  HENT: Positive for congestion and hearing loss.   Eyes: Negative for blurred vision.  Respiratory: Positive for sputum production. Negative for cough and shortness of breath.        Clear rhinorrhea at times, sometimes associated with cold weather  Cardiovascular: Negative for chest pain, palpitations and leg swelling.  Gastrointestinal: Negative for vomiting.  Genitourinary: Positive for frequency.       Incontinence intermittently  Musculoskeletal: Negative for back pain.  Skin: Negative for rash.  Neurological: Positive for headaches. Negative for loss of consciousness.       Objective:    Physical Exam  Constitutional: She is oriented to person, place, and time. She appears well-developed and well-nourished. No distress.  HENT:  Head: Normocephalic and atraumatic.  Mouth/Throat: No oropharyngeal exudate.  Mild oropharynyngeal erythema  Eyes: Conjunctivae are normal.  Neck: Normal range of motion. No thyromegaly present.  Cardiovascular: Normal rate and regular rhythm.   Murmur heard. 1/6 systolic murmur  Pulmonary/Chest: Effort normal and breath sounds normal. She has no wheezes.  Abdominal: Soft. Bowel sounds are normal. She exhibits no distension and no mass. There is no tenderness. There is no rebound.  No tenderness, no distension   Musculoskeletal: Normal range of motion. She exhibits no edema or  deformity.  Neurological: She is alert and oriented to person, place, and time. She has normal reflexes. No cranial nerve deficit.  Skin: Skin is warm and dry. She is not diaphoretic.  Psychiatric: She has a normal mood and affect.    BP 136/78 (BP Location: Left Arm, Patient Position: Sitting, Cuff Size: Normal)   Pulse 64   Temp 97.8 F (36.6 C) (Oral)   Wt 157 lb 6.4 oz (71.4 kg)   SpO2 98%   BMI 26.19 kg/m  Wt Readings from Last 3 Encounters:  05/30/16 157 lb 6.4 oz (71.4 kg)  03/08/16 159 lb 6 oz (72.3 kg)  03/06/16 150 lb (68 kg)     Lab Results  Component Value Date   WBC 6.6 11/09/2015   HGB 14.2 11/09/2015   HCT 42.5 11/09/2015   PLT 237.0 11/09/2015   GLUCOSE 89 11/09/2015   CHOL 197 06/09/2015   TRIG 96.0 06/09/2015   HDL 52.00 06/09/2015   LDLCALC 126 (H) 06/09/2015   ALT 11 11/09/2015   AST 15 11/09/2015   NA 138 11/09/2015   K 4.3 11/09/2015   CL 102 11/09/2015   CREATININE 0.68 11/09/2015   BUN 15 11/09/2015   CO2 29 11/09/2015   TSH 0.82 11/09/2015    Lab Results  Component Value Date   TSH 0.82 11/09/2015   Lab Results  Component Value Date   WBC 6.6 11/09/2015   HGB 14.2 11/09/2015   HCT 42.5 11/09/2015   MCV 89.7 11/09/2015   PLT 237.0 11/09/2015   Lab Results  Component Value Date   NA 138 11/09/2015   K 4.3 11/09/2015   CO2 29 11/09/2015   GLUCOSE 89 11/09/2015   BUN 15 11/09/2015   CREATININE 0.68 11/09/2015   BILITOT 0.6 11/09/2015   ALKPHOS 62 11/09/2015   AST 15 11/09/2015   ALT 11 11/09/2015   PROT 7.1 11/09/2015   ALBUMIN 4.2 11/09/2015   CALCIUM 10.5 11/09/2015   GFR 86.53 11/09/2015   Lab Results  Component Value Date   CHOL 197 06/09/2015   Lab Results  Component Value Date   HDL 52.00 06/09/2015   Lab Results  Component Value Date   LDLCALC 126 (H) 06/09/2015   Lab Results  Component Value Date   TRIG 96.0 06/09/2015   Lab Results  Component Value Date   CHOLHDL 4 06/09/2015   No results found  for: HGBA1C     Assessment & Plan:   Problem List Items Addressed This Visit    Hypothyroidism - Primary    On Levothyroxine, continue to monitor      Relevant Medications   levothyroxine (SYNTHROID, LEVOTHROID) 75 MCG tablet   Other Relevant Orders   TSH   Vitamin D deficiency    Check level today.       Relevant Orders   VITAMIN D 25 Hydroxy (Vit-D Deficiency, Fractures)   Hyperlipidemia, mixed    Encouraged heart healthy diet, increase exercise, avoid trans fats, consider a krill oil cap daily      Relevant Medications   lisinopril (PRINIVIL,ZESTRIL) 10 MG tablet   Other Relevant Orders   Lipid panel   Hypertension    Well controlled, no changes to meds. Encouraged heart healthy diet such as the DASH diet and exercise as tolerated.       Relevant Medications   lisinopril (PRINIVIL,ZESTRIL) 10 MG tablet   Other Relevant Orders   CBC   Comprehensive metabolic panel   Medicare annual wellness visit, subsequent    Patient denies any difficulties at home. No trouble with ADLs, depression or falls. See EMR for functional status screen and depression screen. No recent changes to vision or hearing. Is UTD with immunizations. Is UTD with screening. Discussed Advanced Directives. Encouraged heart healthy diet, exercise as tolerated and adequate sleep. See patient's problem list for health risk factors to monitor. See AVS for preventative healthcare recommendation schedule.         I have discontinued Ms. Vanderslice's HYDROcodone-homatropine and oxyCODONE. I am also having her maintain her calcium-vitamin D, psyllium, KRILL OIL PO, aspirin EC, senna, lisinopril, and levothyroxine.  Meds ordered this encounter  Medications  . lisinopril (PRINIVIL,ZESTRIL) 10 MG tablet  Sig: Take 0.5 tablets (5 mg total) by mouth daily.    Dispense:  30 tablet    Refill:  5  . levothyroxine (SYNTHROID, LEVOTHROID) 75 MCG tablet    Sig: Take 1 tablet (75 mcg total) by mouth daily.     Dispense:  30 tablet    Refill:  5    CMA served as scribe during this visit. History, Physical and Plan performed by medical provider. Documentation and orders reviewed and attested to.   Penni Homans, MD

## 2016-05-30 NOTE — Assessment & Plan Note (Signed)
Encouraged to get adequate exercise, calcium and vitamin d intake 

## 2016-05-30 NOTE — Progress Notes (Signed)
Pre visit review using our clinic review tool, if applicable. No additional management support is needed unless otherwise documented below in the visit note. 

## 2016-05-30 NOTE — Assessment & Plan Note (Signed)
Encouraged heart healthy diet, increase exercise, avoid trans fats, consider a krill oil cap daily 

## 2016-05-30 NOTE — Assessment & Plan Note (Signed)
On Levothyroxine, continue to monitor 

## 2016-05-30 NOTE — Assessment & Plan Note (Signed)
Well controlled, no changes to meds. Encouraged heart healthy diet such as the DASH diet and exercise as tolerated.  °

## 2016-05-30 NOTE — Patient Instructions (Signed)
Hypertension Hypertension, commonly called high blood pressure, is when the force of blood pumping through your arteries is too strong. Your arteries are the blood vessels that carry blood from your heart throughout your body. A blood pressure reading consists of a higher number over a lower number, such as 110/72. The higher number (systolic) is the pressure inside your arteries when your heart pumps. The lower number (diastolic) is the pressure inside your arteries when your heart relaxes. Ideally you want your blood pressure below 120/80. Hypertension forces your heart to work harder to pump blood. Your arteries may become narrow or stiff. Having untreated or uncontrolled hypertension can cause heart attack, stroke, kidney disease, and other problems. What increases the risk? Some risk factors for high blood pressure are controllable. Others are not. Risk factors you cannot control include:  Race. You may be at higher risk if you are African American.  Age. Risk increases with age.  Gender. Men are at higher risk than women before age 45 years. After age 65, women are at higher risk than men. Risk factors you can control include:  Not getting enough exercise or physical activity.  Being overweight.  Getting too much fat, sugar, calories, or salt in your diet.  Drinking too much alcohol. What are the signs or symptoms? Hypertension does not usually cause signs or symptoms. Extremely high blood pressure (hypertensive crisis) may cause headache, anxiety, shortness of breath, and nosebleed. How is this diagnosed? To check if you have hypertension, your health care provider will measure your blood pressure while you are seated, with your arm held at the level of your heart. It should be measured at least twice using the same arm. Certain conditions can cause a difference in blood pressure between your right and left arms. A blood pressure reading that is higher than normal on one occasion does  not mean that you need treatment. If it is not clear whether you have high blood pressure, you may be asked to return on a different day to have your blood pressure checked again. Or, you may be asked to monitor your blood pressure at home for 1 or more weeks. How is this treated? Treating high blood pressure includes making lifestyle changes and possibly taking medicine. Living a healthy lifestyle can help lower high blood pressure. You may need to change some of your habits. Lifestyle changes may include:  Following the DASH diet. This diet is high in fruits, vegetables, and whole grains. It is low in salt, red meat, and added sugars.  Keep your sodium intake below 2,300 mg per day.  Getting at least 30-45 minutes of aerobic exercise at least 4 times per week.  Losing weight if necessary.  Not smoking.  Limiting alcoholic beverages.  Learning ways to reduce stress. Your health care provider may prescribe medicine if lifestyle changes are not enough to get your blood pressure under control, and if one of the following is true:  You are 18-59 years of age and your systolic blood pressure is above 140.  You are 60 years of age or older, and your systolic blood pressure is above 150.  Your diastolic blood pressure is above 90.  You have diabetes, and your systolic blood pressure is over 140 or your diastolic blood pressure is over 90.  You have kidney disease and your blood pressure is above 140/90.  You have heart disease and your blood pressure is above 140/90. Your personal target blood pressure may vary depending on your medical   conditions, your age, and other factors. Follow these instructions at home:  Have your blood pressure rechecked as directed by your health care provider.  Take medicines only as directed by your health care provider. Follow the directions carefully. Blood pressure medicines must be taken as prescribed. The medicine does not work as well when you skip  doses. Skipping doses also puts you at risk for problems.  Do not smoke.  Monitor your blood pressure at home as directed by your health care provider. Contact a health care provider if:  You think you are having a reaction to medicines taken.  You have recurrent headaches or feel dizzy.  You have swelling in your ankles.  You have trouble with your vision. Get help right away if:  You develop a severe headache or confusion.  You have unusual weakness, numbness, or feel faint.  You have severe chest or abdominal pain.  You vomit repeatedly.  You have trouble breathing. This information is not intended to replace advice given to you by your health care provider. Make sure you discuss any questions you have with your health care provider. Document Released: 04/08/2005 Document Revised: 09/14/2015 Document Reviewed: 01/29/2013 Elsevier Interactive Patient Education  2017 [employer] Inc[employer]  

## 2016-05-30 NOTE — Assessment & Plan Note (Signed)
Patient denies any difficulties at home. No trouble with ADLs, depression or falls. See EMR for functional status screen and depression screen. No recent changes to vision or hearing. Is UTD with immunizations. Is UTD with screening. Discussed Advanced Directives. Encouraged heart healthy diet, exercise as tolerated and adequate sleep. See patient's problem list for health risk factors to monitor. See AVS for preventative healthcare recommendation schedule.

## 2016-05-30 NOTE — Assessment & Plan Note (Signed)
Check level today 

## 2016-05-31 ENCOUNTER — Other Ambulatory Visit: Payer: Self-pay

## 2016-05-31 DIAGNOSIS — E559 Vitamin D deficiency, unspecified: Secondary | ICD-10-CM

## 2016-05-31 MED ORDER — VITAMIN D (ERGOCALCIFEROL) 1.25 MG (50000 UNIT) PO CAPS: 50000.0000 [IU] | ORAL_CAPSULE | ORAL | 4 refills | Status: DC

## 2016-05-31 NOTE — Addendum Note (Signed)
Addended by: Magdalene Molly A on: 05/31/2016 11:19 AM   Modules accepted: Orders

## 2016-06-10 ENCOUNTER — Other Ambulatory Visit: Payer: Self-pay | Admitting: Family Medicine

## 2016-06-10 DIAGNOSIS — H18413 Arcus senilis, bilateral: Secondary | ICD-10-CM | POA: Diagnosis not present

## 2016-06-10 DIAGNOSIS — Z961 Presence of intraocular lens: Secondary | ICD-10-CM | POA: Diagnosis not present

## 2016-06-10 DIAGNOSIS — H524 Presbyopia: Secondary | ICD-10-CM | POA: Diagnosis not present

## 2016-06-10 DIAGNOSIS — H43393 Other vitreous opacities, bilateral: Secondary | ICD-10-CM | POA: Diagnosis not present

## 2016-06-10 DIAGNOSIS — H04123 Dry eye syndrome of bilateral lacrimal glands: Secondary | ICD-10-CM | POA: Diagnosis not present

## 2016-06-10 DIAGNOSIS — Z1231 Encounter for screening mammogram for malignant neoplasm of breast: Secondary | ICD-10-CM

## 2016-06-10 DIAGNOSIS — H52203 Unspecified astigmatism, bilateral: Secondary | ICD-10-CM | POA: Diagnosis not present

## 2016-06-10 DIAGNOSIS — H5203 Hypermetropia, bilateral: Secondary | ICD-10-CM | POA: Diagnosis not present

## 2016-06-17 ENCOUNTER — Other Ambulatory Visit: Payer: Self-pay | Admitting: Family Medicine

## 2016-06-17 MED ORDER — LEVOTHYROXINE SODIUM 75 MCG PO TABS: 75.0000 ug | ORAL_TABLET | Freq: Every day | ORAL | 1 refills | Status: DC

## 2016-06-21 NOTE — Progress Notes (Signed)
We did not see or even know how to unblock a note. That is still a mystery.

## 2016-07-01 IMAGING — DX DG CHEST 2V
2 series · 2 of 2 positions shown · non-contrast
Comparison: CT chest 05/11/2014.

CLINICAL DATA: Shortness of breath for a few weeks.  Ex-smoker.

EXAM:
CHEST  2 VIEW

[chest pa]
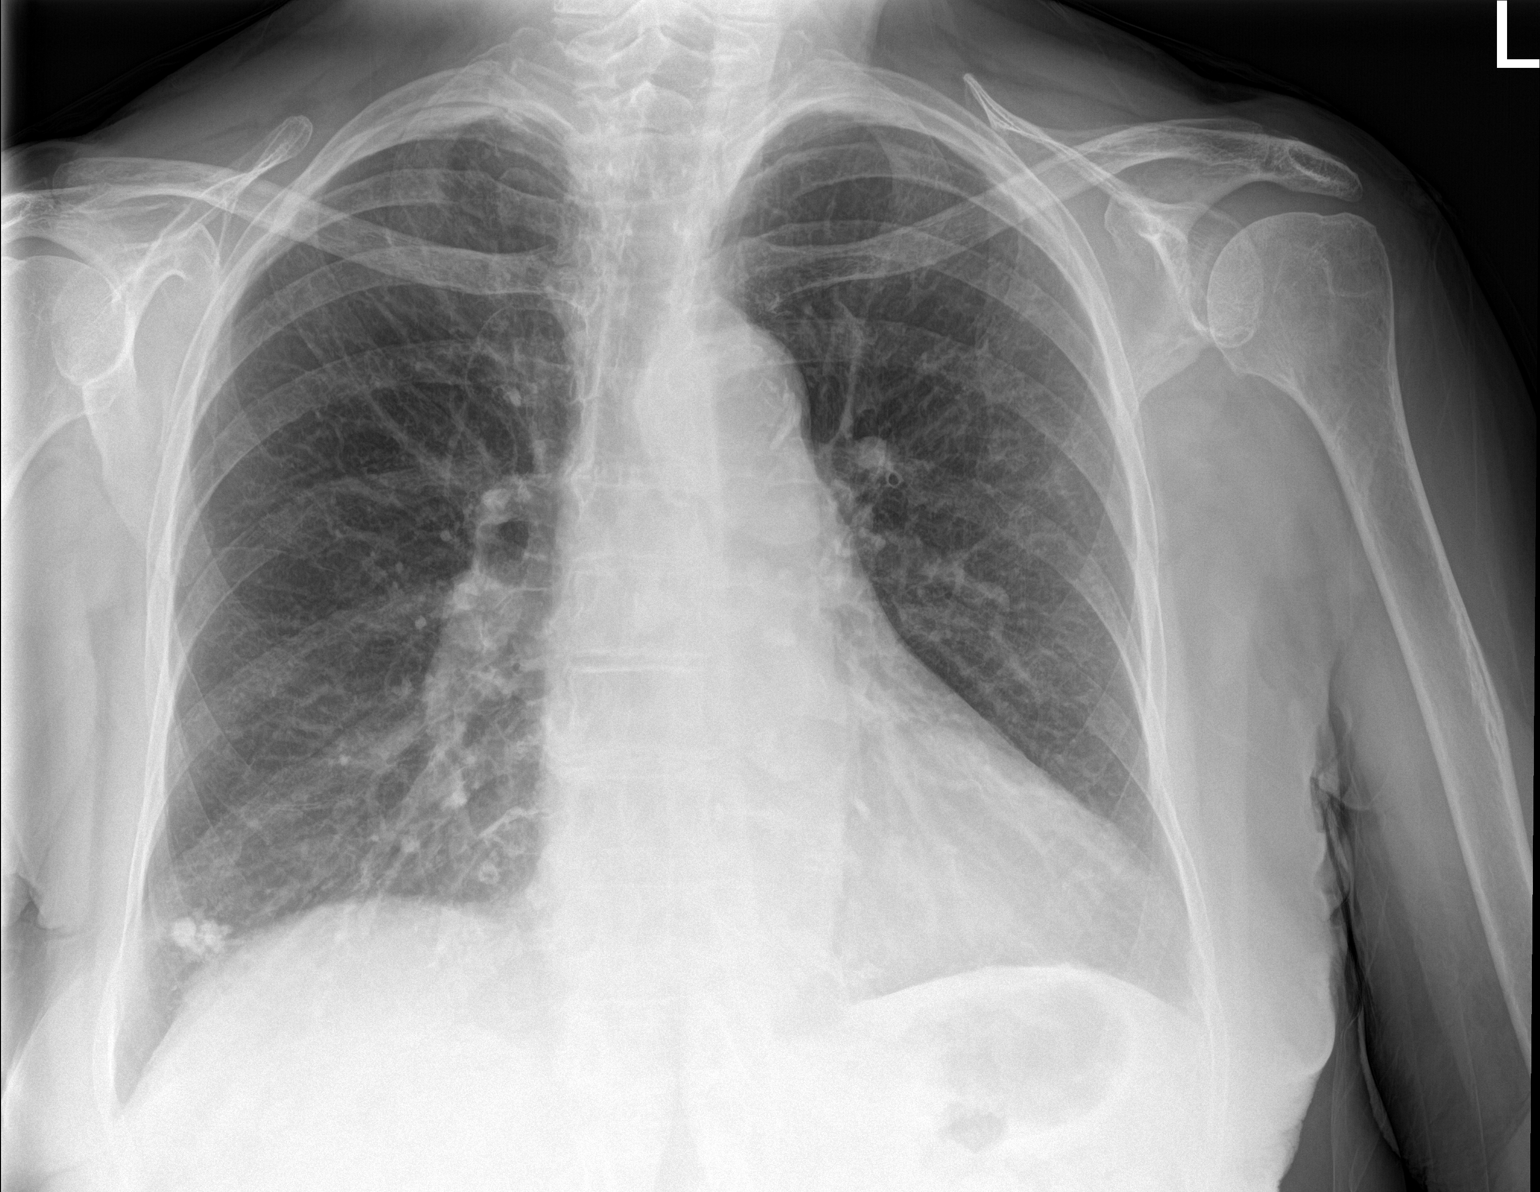

[chest lat]
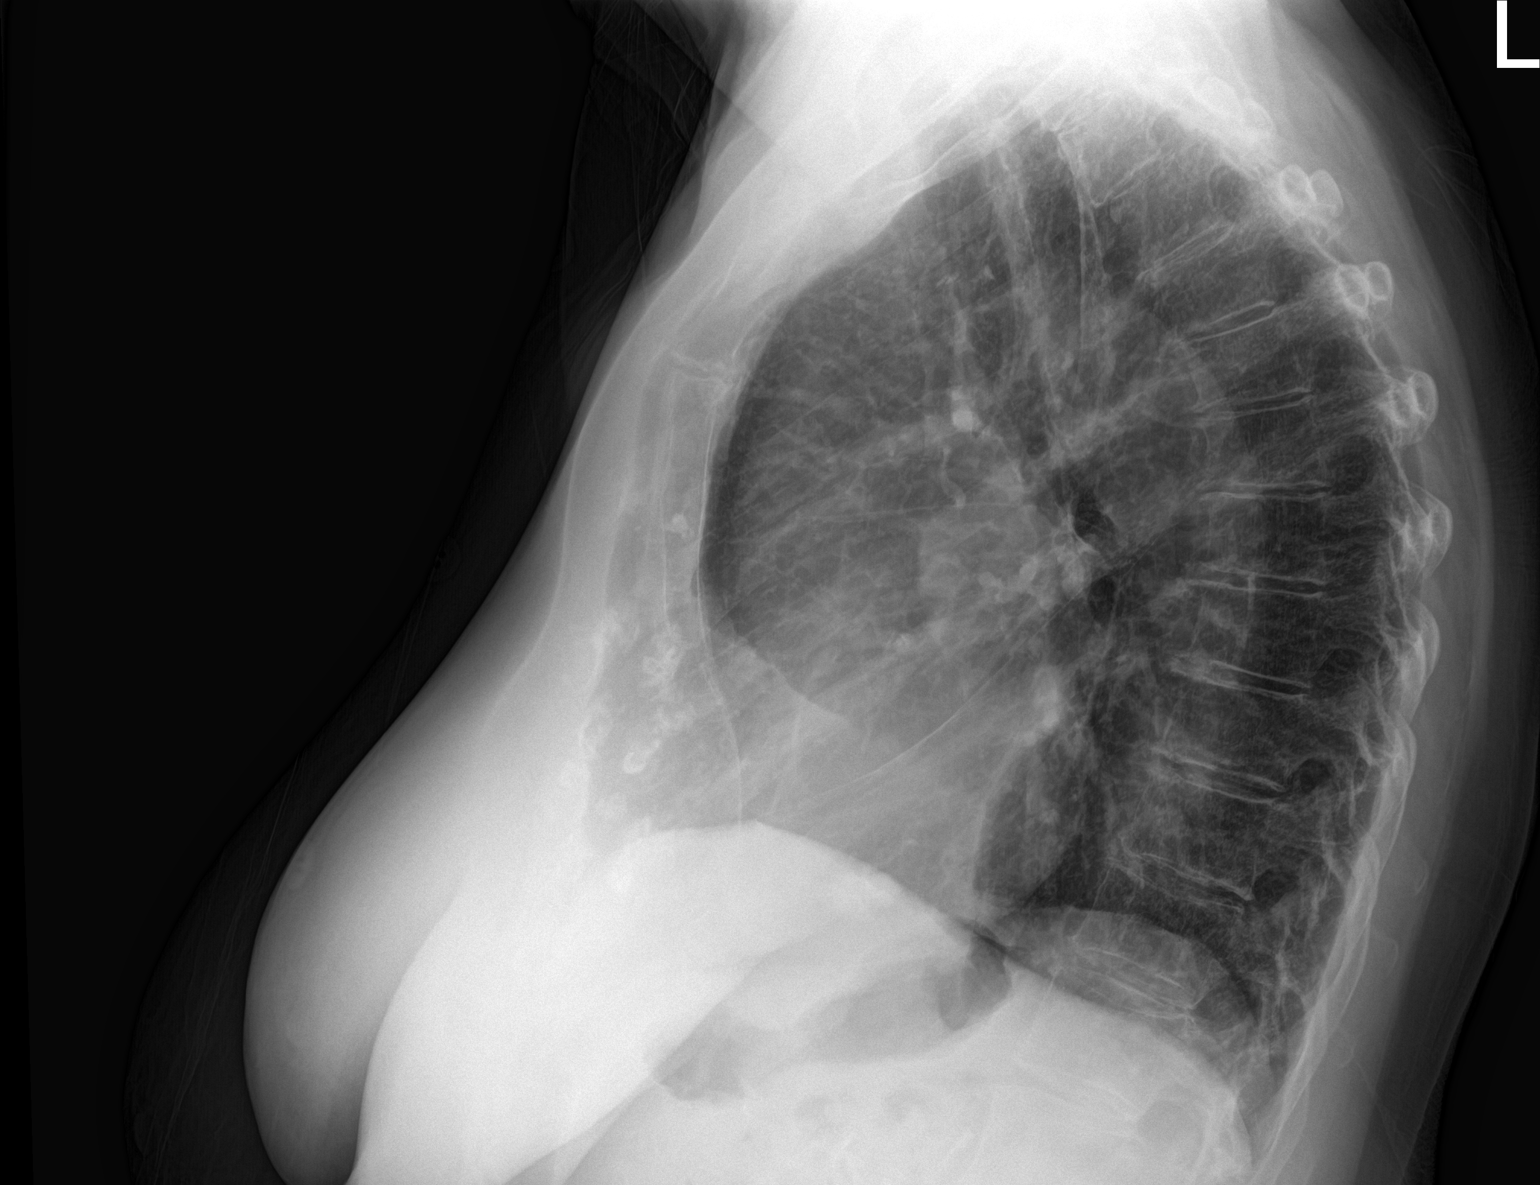

[2 of 2 positions shown; findings below may reference images not displayed]

FINDINGS: Cardiomegaly. No active infiltrates or failure. Granuloma RIGHT lung
base. No effusion or pneumothorax. Coarsened interstitial markings
suggesting COPD. No osseous findings.
IMPRESSION: COPD, cardiomegaly, no active disease.

## 2016-07-08 ENCOUNTER — Ambulatory Visit (HOSPITAL_BASED_OUTPATIENT_CLINIC_OR_DEPARTMENT_OTHER)
Admission: RE | Admit: 2016-07-08 | Discharge: 2016-07-08 | Disposition: A | Discharge status: Home / Self Care | Payer: Medicare Other | Source: Ambulatory Visit | Attending: Family Medicine | Admitting: Family Medicine

## 2016-07-08 DIAGNOSIS — Z1231 Encounter for screening mammogram for malignant neoplasm of breast: Secondary | ICD-10-CM

## 2016-07-26 DIAGNOSIS — M79674 Pain in right toe(s): Secondary | ICD-10-CM | POA: Diagnosis not present

## 2016-07-26 DIAGNOSIS — B351 Tinea unguium: Secondary | ICD-10-CM | POA: Diagnosis not present

## 2016-07-26 DIAGNOSIS — M79675 Pain in left toe(s): Secondary | ICD-10-CM | POA: Diagnosis not present

## 2016-08-28 ENCOUNTER — Other Ambulatory Visit (INDEPENDENT_AMBULATORY_CARE_PROVIDER_SITE_OTHER): Payer: Medicare Other

## 2016-08-28 DIAGNOSIS — E559 Vitamin D deficiency, unspecified: Secondary | ICD-10-CM

## 2016-08-28 LAB — VITAMIN D 25 HYDROXY (VIT D DEFICIENCY, FRACTURES): VITD: 35.38 ng/mL (ref 30.00–100.00)

## 2016-08-29 ENCOUNTER — Other Ambulatory Visit: Payer: Self-pay | Admitting: Family Medicine

## 2016-09-27 DIAGNOSIS — M79674 Pain in right toe(s): Secondary | ICD-10-CM | POA: Diagnosis not present

## 2016-09-27 DIAGNOSIS — M79675 Pain in left toe(s): Secondary | ICD-10-CM | POA: Diagnosis not present

## 2016-09-27 DIAGNOSIS — B351 Tinea unguium: Secondary | ICD-10-CM | POA: Diagnosis not present

## 2016-11-28 ENCOUNTER — Encounter: Payer: Self-pay | Admitting: Family Medicine

## 2016-11-28 ENCOUNTER — Ambulatory Visit (INDEPENDENT_AMBULATORY_CARE_PROVIDER_SITE_OTHER): Payer: Medicare Other | Admitting: Family Medicine

## 2016-11-28 VITALS — BP 116/48 | HR 38 | Temp 98.3°F | Resp 18 | Wt 159.4 lb

## 2016-11-28 DIAGNOSIS — E039 Hypothyroidism, unspecified: Secondary | ICD-10-CM | POA: Diagnosis not present

## 2016-11-28 DIAGNOSIS — I10 Essential (primary) hypertension: Secondary | ICD-10-CM | POA: Diagnosis not present

## 2016-11-28 DIAGNOSIS — E559 Vitamin D deficiency, unspecified: Secondary | ICD-10-CM

## 2016-11-28 DIAGNOSIS — E782 Mixed hyperlipidemia: Secondary | ICD-10-CM | POA: Diagnosis not present

## 2016-11-28 DIAGNOSIS — R001 Bradycardia, unspecified: Secondary | ICD-10-CM | POA: Diagnosis not present

## 2016-11-28 LAB — COMPREHENSIVE METABOLIC PANEL
ALK PHOS: 53 U/L (ref 39–117)
ALT: 10 U/L (ref 0–35)
AST: 14 U/L (ref 0–37)
Albumin: 4 g/dL (ref 3.5–5.2)
BILIRUBIN TOTAL: 0.4 mg/dL (ref 0.2–1.2)
BUN: 18 mg/dL (ref 6–23)
CHLORIDE: 109 meq/L (ref 96–112)
CO2: 29 meq/L (ref 19–32)
CREATININE: 0.76 mg/dL (ref 0.40–1.20)
Calcium: 9.7 mg/dL (ref 8.4–10.5)
GFR: 75.93 mL/min (ref >60.00)
GLUCOSE: 94 mg/dL (ref 70–99)
POTASSIUM: 4.4 meq/L (ref 3.5–5.1)
Sodium: 141 mEq/L (ref 135–145)
Total Protein: 6.4 g/dL (ref 6.0–8.3)

## 2016-11-28 LAB — CBC
HEMATOCRIT: 43.7 % (ref 36.0–46.0)
HEMOGLOBIN: 14.1 g/dL (ref 12.0–15.0)
MCHC: 32.3 g/dL (ref 30.0–36.0)
MCV: 92.6 fl (ref 78.0–100.0)
Platelets: 212 10*3/uL (ref 150.0–400.0)
RBC: 4.72 Mil/uL (ref 3.87–5.11)
RDW: 14.1 % (ref 11.5–15.5)
WBC: 6.1 10*3/uL (ref 4.0–10.5)

## 2016-11-28 LAB — VITAMIN D 25 HYDROXY (VIT D DEFICIENCY, FRACTURES): VITD: 34.02 ng/mL (ref 30.00–100.00)

## 2016-11-28 LAB — LIPID PANEL
Cholesterol: 180 mg/dL (ref 0–200)
HDL: 45.1 mg/dL (ref >39.00)
LDL Cholesterol: 121 mg/dL — ABNORMAL HIGH (ref 0–99)
NonHDL: 134.5
Total CHOL/HDL Ratio: 4
Triglycerides: 66 mg/dL (ref 0.0–149.0)
VLDL: 13.2 mg/dL (ref 0.0–40.0)

## 2016-11-28 LAB — TSH: TSH: 1.53 u[IU]/mL (ref 0.35–4.50)

## 2016-11-28 MED ORDER — LISINOPRIL 5 MG PO TABS: 2.5000 mg | ORAL_TABLET | Freq: Every day | ORAL | 1 refills | Status: DC

## 2016-11-28 MED ORDER — LEVOTHYROXINE SODIUM 75 MCG PO TABS: 75.0000 ug | ORAL_TABLET | Freq: Every day | ORAL | 1 refills | Status: DC

## 2016-11-28 NOTE — Assessment & Plan Note (Signed)
Encouraged heart healthy diet, increase exercise, avoid trans fats, consider a krill oil cap daily 

## 2016-11-28 NOTE — Assessment & Plan Note (Signed)
Check vitamin D today, takes a vitamin D OTC cap daily

## 2016-11-28 NOTE — Assessment & Plan Note (Signed)
On Levothyroxine, continue to monitor 

## 2016-11-28 NOTE — Patient Instructions (Signed)
Bradycardia, Adult °Bradycardia is a slower-than-normal heartbeat. A normal resting heart rate for an adult ranges from 60 to 100 beats per minute. With bradycardia, the resting heart rate is less than 60 beats per minute. °Bradycardia can prevent enough oxygen from reaching certain areas of your body when you are active. It can be serious if it keeps enough oxygen from reaching your brain and other parts of your body. Bradycardia is not a problem for everyone. For some healthy adults, a slow resting heart rate is normal. °What are the causes? °This condition may be caused by: °· A problem with the heart, including: °? A problem with the heart's electrical system, such as a heart block. °? A problem with the heart's natural pacemaker (sinus node). °? Heart disease. °? A heart attack. °? Heart damage. °? A heart infection. °? A heart condition that is present at birth (congenital heart defect). °· Certain medicines that treat heart conditions. °· Certain conditions, such as hypothyroidism and obstructive sleep apnea. °· Problems with the balance of chemicals and other substances, like potassium, in the blood. ° °What increases the risk? °This condition is more likely to develop in adults who: °· Are age 65 or older. °· Have high blood pressure (hypertension), high cholesterol (hyperlipidemia), or diabetes. °· Drink heavily, use tobacco or nicotine products, or use drugs. °· Are stressed. ° °What are the signs or symptoms? °Symptoms of this condition include: °· Light-headedness. °· Feeling faint or fainting. °· Fatigue and weakness. °· Shortness of breath. °· Chest pain (angina). °· Drowsiness. °· Confusion. °· Dizziness. ° °How is this diagnosed? °This condition may be diagnosed based on: °· Your symptoms. °· Your medical history. °· A physical exam. ° °During the exam, your health care provider will listen to your heartbeat and check your pulse. To confirm the diagnosis, your health care provider may order tests,  such as: °· Blood tests. °· An electrocardiogram (ECG). This test records the heart's electrical activity. The test can show how fast your heart is beating and whether the heartbeat is steady. °· A test in which you wear a portable device (event recorder or Holter monitor) to record your heart's electrical activity while you go about your day. °· An exercise test. ° °How is this treated? °Treatment for this condition depends on the cause of the condition and how severe your symptoms are. Treatment may involve: °· Treatment of the underlying condition. °· Changing your medicines or how much medicine you take. °· Having a small, battery-operated device called a pacemaker implanted under the skin. When bradycardia occurs, this device can be used to increase your heart rate and help your heart to beat in a regular rhythm. ° °Follow these instructions at home: °Lifestyle ° °· Manage any health conditions that contribute to bradycardia as told by your health care provider. °· Follow a heart-healthy diet. A nutrition specialist (dietitian) can help to educate you about healthy food options and changes. °· Follow an exercise program that is approved by your health care provider. °· Maintain a healthy weight. °· Try to reduce or manage your stress, such as with yoga or meditation. If you need help reducing stress, ask your health care provider. °· Do not use use any products that contain nicotine or tobacco, such as cigarettes and e-cigarettes. If you need help quitting, ask your health care provider. °· Do not use illegal drugs. °· Limit alcohol intake to no more than 1 drink per day for nonpregnant women and 2 drinks per   day for men. One drink equals 12 oz of beer, 5 oz of wine, or 1½ oz of hard liquor. °General instructions °· Take over-the-counter and prescription medicines only as told by your health care provider. °· Keep all follow-up visits as directed by your health care provider. This is important. °How is this  prevented? °In some cases, bradycardia may be prevented by: °· Treating underlying medical problems. °· Stopping behaviors or medicines that can trigger the condition. ° °Contact a health care provider if: °· You feel light-headed or dizzy. °· You almost faint. °· You feel weak or are easily fatigued during physical activity. °· You experience confusion or have memory problems. °Get help right away if: °· You faint. °· You have an irregular heartbeat (palpitations). °· You have chest pain. °· You have trouble breathing. °This information is not intended to replace advice given to you by your health care provider. Make sure you discuss any questions you have with your health care provider. °Document Released: 12/29/2001 Document Revised: 12/05/2015 Document Reviewed: 09/28/2015 °Elsevier Interactive Patient Education © 2017 [employer] ° °

## 2016-11-28 NOTE — Progress Notes (Signed)
Subjective:  I acted as a Education administrator for Dr. Charlett Blake. Kylie Walters, Kylie Walters  Patient ID: Kylie Walters, female    DOB: 06/27/1924, 83 y.o.   MRN: [id number]  No chief complaint on file.   HPI  Patient is in today for a 6 month follow up She is following up on her HTn, hyperlipidemia and other medical conditions. Patient states she is doing well other than having little energy to do things. No recent febrile illness or acute hospitalizations. Denies CP/palp/SOB/HA/congestion/fevers/GI or GU c/o. Taking meds as prescribed. Doing well with ADLs.   Patient Care Team: Mosie Lukes, MD as PCP - General (Family Medicine) Roel Cluck, MD as Referring Physician (Ophthalmology)   Past Medical History:  Diagnosis Date  . Arthritis   . CTS (carpal tunnel syndrome) 06/18/2015  . Depression 4/03  . Diverticulosis   . Diverticulosis of colon without hemorrhage 10/07/2011  . Elevated blood pressure 07/24/2015  . GERD (gastroesophageal reflux disease)   . Granuloma of skin    RLL  . H/O measles   . H/O mumps   . Hearing loss   . History of chicken pox   . Hyperlipidemia   . Hypertension 07/24/2015  . Hypothyroidism   . Incontinence in female 02/06/2015  . Lumbar herniated disc   . Medicare annual wellness visit, subsequent 05/30/2016  . Nocturia 06/18/2015  . Osteopenia   . Tinnitus 07/24/2015  . Vitamin D deficiency     Past Surgical History:  Procedure Laterality Date  . ABDOMINAL HYSTERECTOMY  1980  . APPENDECTOMY    . BREAST SURGERY     L breast cyst  . CATARACT EXTRACTION  2010  . [place]  . KNEE SURGERY  2004   left knee  . TONSILLECTOMY AND ADENOIDECTOMY  1939    Family History  Problem Relation Age of Onset  . Breast cancer Mother        metastatic disease  . Heart disease Mother        Atrial fibrillation  . Kidney disease Father        Bright's disease  . Cancer Maternal Grandfather        prostate  . Obesity Daughter   .  Thyroid disease Son   . Hyperlipidemia Son     Social History   Social History  . Marital status: Widowed    Spouse name: N/A  . Number of children: 3  . Years of education: N/A   Occupational History  . Not on file.   Social History Main Topics  . Smoking status: Former Smoker    Packs/day: 0.50    Years: 40.00    Types: Cigarettes    Start date: 06/09/1940    Quit date: 04/23/1979  . Smokeless tobacco: Never Used     Comment: quit smoking 40 years ago  . Alcohol use 0.0 oz/week     Comment: socially  . Drug use: No  . Sexual activity: Not Currently    Birth control/ protection: Post-menopausal     Comment: moved to [facility], widowed. no dietary restrictions   Other Topics Concern  . Not on file   Social History Narrative  . No narrative on file    Outpatient Medications Prior to Visit  Medication Sig Dispense Refill  . aspirin EC 81 MG tablet Take 81 mg by mouth daily.    . calcium-vitamin D (OSCAL WITH D) 500-200 MG-UNIT per tablet Take 1 tablet  by mouth.    Marland Kitchen KRILL OIL PO Take by mouth daily.    . psyllium (METAMUCIL) 58.6 % packet Take 1 packet by mouth daily.    Marland Kitchen senna (SENOKOT) 8.6 MG tablet Take 2 tablets by mouth as needed for constipation.    Marland Kitchen levothyroxine (SYNTHROID, LEVOTHROID) 75 MCG tablet Take 1 tablet (75 mcg total) by mouth daily. 90 tablet 1  . lisinopril (PRINIVIL,ZESTRIL) 10 MG tablet Take 0.5 tablets (5 mg total) by mouth daily. 30 tablet 5   No facility-administered medications prior to visit.     Allergies  Allergen Reactions  . Latex Itching  . Lipitor [Atorvastatin Calcium] Other (See Comments)    Myalgia     Review of Systems  Constitutional: Negative for fever and malaise/fatigue.  HENT: Negative for congestion.   Eyes: Negative for blurred vision.  Respiratory: Negative for cough and shortness of breath.   Cardiovascular: Negative for chest pain, palpitations and leg swelling.  Gastrointestinal: Negative for vomiting.    Musculoskeletal: Negative for back pain.  Skin: Negative for rash.  Neurological: Negative for loss of consciousness and headaches.       Objective:    Physical Exam  Constitutional: She is oriented to person, place, and time. She appears well-developed and well-nourished. No distress.  HENT:  Head: Normocephalic and atraumatic.  Eyes: Conjunctivae are normal.  Neck: Normal range of motion. No thyromegaly present.  Cardiovascular: Normal rate and regular rhythm.   Pulmonary/Chest: Effort normal and breath sounds normal. She has no wheezes.  Abdominal: Soft. Bowel sounds are normal. There is no tenderness.  Musculoskeletal: Normal range of motion. She exhibits no edema or deformity.  Neurological: She is alert and oriented to person, place, and time.  Skin: Skin is warm and dry. She is not diaphoretic.  Psychiatric: She has a normal mood and affect.    BP (!) 116/48 (BP Location: Left Arm, Patient Position: Sitting, Cuff Size: Normal)   Pulse (!) 38   Temp 98.3 F (36.8 C) (Oral)   Resp 18   Wt 159 lb 6.4 oz (72.3 kg)   SpO2 95%   BMI 26.53 kg/m  Wt Readings from Last 3 Encounters:  11/28/16 159 lb 6.4 oz (72.3 kg)  05/30/16 157 lb 6.4 oz (71.4 kg)  03/08/16 159 lb 6 oz (72.3 kg)   BP Readings from Last 3 Encounters:  11/28/16 (!) 116/48  05/30/16 136/78  03/08/16 140/88     Immunization History  Administered Date(s) Administered  . Influenza Split 02/01/2011, 01/13/2012  . Influenza, High Dose Seasonal PF 01/04/2016  . Influenza,inj,Quad PF,36+ Mos 01/17/2014  . Influenza-Unspecified 01/13/2015  . PPD Test 11/15/2014  . Pneumococcal Conjugate-13 04/22/2002, 07/22/2010, 05/18/2014  . Pneumococcal Polysaccharide-23 07/24/2015  . Td 06/20/2008    Health Maintenance  Topic Date Due  . INFLUENZA VACCINE  11/20/2016  . MAMMOGRAM  07/08/2017  . TETANUS/TDAP  06/21/2018  . DEXA SCAN  Completed  . PNA vac Low Risk Adult  Completed    Lab Results  Component  Value Date   WBC 6.1 11/28/2016   HGB 14.1 11/28/2016   HCT 43.7 11/28/2016   PLT 212.0 11/28/2016   GLUCOSE 94 11/28/2016   CHOL 180 11/28/2016   TRIG 66.0 11/28/2016   HDL 45.10 11/28/2016   LDLCALC 121 (H) 11/28/2016   ALT 10 11/28/2016   AST 14 11/28/2016   NA 141 11/28/2016   K 4.4 11/28/2016   CL 109 11/28/2016   CREATININE 0.76 11/28/2016  BUN 18 11/28/2016   CO2 29 11/28/2016   TSH 1.53 11/28/2016    Lab Results  Component Value Date   TSH 1.53 11/28/2016   Lab Results  Component Value Date   WBC 6.1 11/28/2016   HGB 14.1 11/28/2016   HCT 43.7 11/28/2016   MCV 92.6 11/28/2016   PLT 212.0 11/28/2016   Lab Results  Component Value Date   NA 141 11/28/2016   K 4.4 11/28/2016   CO2 29 11/28/2016   GLUCOSE 94 11/28/2016   BUN 18 11/28/2016   CREATININE 0.76 11/28/2016   BILITOT 0.4 11/28/2016   ALKPHOS 53 11/28/2016   AST 14 11/28/2016   ALT 10 11/28/2016   PROT 6.4 11/28/2016   ALBUMIN 4.0 11/28/2016   CALCIUM 9.7 11/28/2016   GFR 75.93 11/28/2016   Lab Results  Component Value Date   CHOL 180 11/28/2016   Lab Results  Component Value Date   HDL 45.10 11/28/2016   Lab Results  Component Value Date   LDLCALC 121 (H) 11/28/2016   Lab Results  Component Value Date   TRIG 66.0 11/28/2016   Lab Results  Component Value Date   CHOLHDL 4 11/28/2016   No results found for: HGBA1C       Assessment & Plan:   Problem List Items Addressed This Visit    Hypothyroidism    On Levothyroxine, continue to monitor      Relevant Medications   levothyroxine (SYNTHROID, LEVOTHROID) 75 MCG tablet   Vitamin D deficiency    Check vitamin D today, takes a vitamin D OTC cap daily      Relevant Orders   VITAMIN D 25 Hydroxy (Vit-D Deficiency, Fractures) (Completed)   Hyperlipidemia, mixed    Encouraged heart healthy diet, increase exercise, avoid trans fats, consider a krill oil cap daily      Relevant Medications   lisinopril (PRINIVIL,ZESTRIL)  5 MG tablet   Other Relevant Orders   Lipid panel (Completed)   Ambulatory referral to Cardiology   Hypertension    Well controlled, no changes to meds. Encouraged heart healthy diet such as the DASH diet and exercise as tolerated. A little low will drop the Lisinopril to 2.5 mg tab daily      Relevant Medications   lisinopril (PRINIVIL,ZESTRIL) 5 MG tablet   Other Relevant Orders   CBC (Completed)   Comprehensive metabolic panel (Completed)   TSH (Completed)   Ambulatory referral to Cardiology   Bradycardia - Primary    Asymptomatic but will refer to cardiology for ongoing surveillance and she will seek c are if symptoms develop      Relevant Orders   Ambulatory referral to Cardiology      I have discontinued Kylie Walters's lisinopril. I am also having her start on lisinopril. Additionally, I am having her maintain her calcium-vitamin D, psyllium, KRILL OIL PO, aspirin EC, senna, and levothyroxine.  Meds ordered this encounter  Medications  . levothyroxine (SYNTHROID, LEVOTHROID) 75 MCG tablet    Sig: Take 1 tablet (75 mcg total) by mouth daily.    Dispense:  90 tablet    Refill:  1  . lisinopril (PRINIVIL,ZESTRIL) 5 MG tablet    Sig: Take 0.5 tablets (2.5 mg total) by mouth daily.    Dispense:  45 tablet    Refill:  1    CMA served as scribe during this visit. History, Physical and Plan performed by medical provider. Documentation and orders reviewed and attested to.  Penni Homans, MD

## 2016-11-28 NOTE — Assessment & Plan Note (Signed)
Well controlled, no changes to meds. Encouraged heart healthy diet such as the DASH diet and exercise as tolerated. A little low will drop the Lisinopril to 2.5 mg tab daily

## 2016-11-29 DIAGNOSIS — M79674 Pain in right toe(s): Secondary | ICD-10-CM | POA: Diagnosis not present

## 2016-11-29 DIAGNOSIS — M79675 Pain in left toe(s): Secondary | ICD-10-CM | POA: Diagnosis not present

## 2016-11-29 DIAGNOSIS — B351 Tinea unguium: Secondary | ICD-10-CM | POA: Diagnosis not present

## 2016-12-01 DIAGNOSIS — R001 Bradycardia, unspecified: Secondary | ICD-10-CM | POA: Insufficient documentation

## 2016-12-01 NOTE — Assessment & Plan Note (Signed)
Asymptomatic but will refer to cardiology for ongoing surveillance and she will seek c are if symptoms develop

## 2016-12-17 ENCOUNTER — Telehealth: Payer: Self-pay | Admitting: Family Medicine

## 2016-12-17 NOTE — Telephone Encounter (Signed)
[facility] to [facility]  Phone: [phone]  Reason for call:  Patient would like a print out of her vaccinations, please mail and she would like to know if she's do and if so she would like to schedule nurse visit,please advise

## 2016-12-17 NOTE — Telephone Encounter (Signed)
Are we doing nurse visit for Flu shots yet?  Please advise

## 2016-12-19 NOTE — Telephone Encounter (Signed)
LMOVM that per SB ok to sched nurse visit for Flu shot/thx dmf

## 2016-12-19 NOTE — Telephone Encounter (Signed)
Called patient left message for her to call to schedule appointment for Flu shot and be placed on [place] schedule.

## 2016-12-19 NOTE — Telephone Encounter (Signed)
We have not set up a schedule yet but if patient is requesting we can do an nurse visit for her flu shot

## 2017-01-02 NOTE — Progress Notes (Signed)
HPI: FU dyspnea. Chest x-ray February 2017 showed COPD and cardiomegaly. Echo March 2017 showed normal LV function, grade 1 diastolic dysfunction, mild aortic insufficiency and mild left atrial enlargement. Patient seen by primary care on August 9 and noted to be bradycardic with heart rate documented at 38. TSH 11/28/2016 1.53. Since last seen, patient denies dyspnea, chest pain, palpitations or syncope.  Current Outpatient Prescriptions  Medication Sig Dispense Refill  . aspirin EC 81 MG tablet Take 81 mg by mouth daily.    . calcium-vitamin D (OSCAL WITH D) 500-200 MG-UNIT per tablet Take 1 tablet by mouth.    Marland Kitchen KRILL OIL PO Take by mouth daily.    Marland Kitchen levothyroxine (SYNTHROID, LEVOTHROID) 75 MCG tablet Take 1 tablet (75 mcg total) by mouth daily. 90 tablet 1  . lisinopril (PRINIVIL,ZESTRIL) 5 MG tablet Take 0.5 tablets (2.5 mg total) by mouth daily. 45 tablet 1  . psyllium (METAMUCIL) 58.6 % packet Take 1 packet by mouth daily.    Marland Kitchen senna (SENOKOT) 8.6 MG tablet Take 2 tablets by mouth as needed for constipation.     No current facility-administered medications for this visit.      Past Medical History:  Diagnosis Date  . Arthritis   . CTS (carpal tunnel syndrome) 06/18/2015  . Depression 4/03  . Diverticulosis   . Diverticulosis of colon without hemorrhage 10/07/2011  . Elevated blood pressure 07/24/2015  . GERD (gastroesophageal reflux disease)   . Granuloma of skin    RLL  . H/O measles   . H/O mumps   . Hearing loss   . History of chicken pox   . Hyperlipidemia   . Hypertension 07/24/2015  . Hypothyroidism   . Incontinence in female 02/06/2015  . Lumbar herniated disc   . Medicare annual wellness visit, subsequent 05/30/2016  . Nocturia 06/18/2015  . Osteopenia   . Tinnitus 07/24/2015  . Vitamin D deficiency     Past Surgical History:  Procedure Laterality Date  . ABDOMINAL HYSTERECTOMY  1980  . APPENDECTOMY    . BREAST SURGERY     L breast cyst  . CATARACT  EXTRACTION  2010  . [place]  . KNEE SURGERY  2004   left knee  . TONSILLECTOMY AND ADENOIDECTOMY  1939    Social History   Social History  . Marital status: Widowed    Spouse name: N/A  . Number of children: 3  . Years of education: N/A   Occupational History  . Not on file.   Social History Main Topics  . Smoking status: Former Smoker    Packs/day: 0.50    Years: 40.00    Types: Cigarettes    Start date: 06/09/1940    Quit date: 04/23/1979  . Smokeless tobacco: Never Used     Comment: quit smoking 40 years ago  . Alcohol use 0.0 oz/week     Comment: socially  . Drug use: No  . Sexual activity: Not Currently    Birth control/ protection: Post-menopausal     Comment: moved to [facility] widowed. no dietary restrictions   Other Topics Concern  . Not on file   Social History Narrative  . No narrative on file    Family History  Problem Relation Age of Onset  . Breast cancer Mother        metastatic disease  . Heart disease Mother        Atrial fibrillation  .  Kidney disease Father        Bright's disease  . Cancer Maternal Grandfather        prostate  . Obesity Daughter   . Thyroid disease Son   . Hyperlipidemia Son     ROS: no fevers or chills, productive cough, hemoptysis, dysphasia, odynophagia, melena, hematochezia, dysuria, hematuria, rash, seizure activity, orthopnea, PND, pedal edema, claudication. Remaining systems are negative.  Physical Exam: Well-developed well-nourished in no acute distress.  Skin is warm and dry.  HEENT is normal.  Neck is supple.  Chest is clear to auscultation with normal expansion.  Cardiovascular exam is regular rate and rhythm.  Abdominal exam nontender or distended. No masses palpated. Extremities show no edema. neuro grossly intact  ECG- Sinus rhythm at a rate of 60. Nonspecific ST changes. personally reviewed  A/P  1 Bradycardia-patient noted to be bradycardic at  previous office visit with primary care. Her heart rate today is 60 and she is having no symptoms. We will not pursue further evaluation unless she develops more issues in the future.  2 hypertension-blood pressure is elevated. However she states typically control. Continue present medications and follow.  3 hyperlipidemia-management per primary care.  4 dyspnea-previous echocardiogram showed normal LV function. No significant symptoms at present. No further evaluation.  Kirk Ruths, MD

## 2017-01-03 ENCOUNTER — Encounter: Payer: Self-pay | Admitting: Cardiology

## 2017-01-15 ENCOUNTER — Encounter: Payer: Self-pay | Admitting: Cardiology

## 2017-01-15 ENCOUNTER — Ambulatory Visit (INDEPENDENT_AMBULATORY_CARE_PROVIDER_SITE_OTHER): Payer: Medicare Other | Admitting: Cardiology

## 2017-01-15 VITALS — BP 156/66 | HR 60 | Ht 65.0 in | Wt 159.4 lb

## 2017-01-15 DIAGNOSIS — R001 Bradycardia, unspecified: Secondary | ICD-10-CM | POA: Diagnosis not present

## 2017-01-15 DIAGNOSIS — E7800 Pure hypercholesterolemia, unspecified: Secondary | ICD-10-CM | POA: Diagnosis not present

## 2017-01-15 DIAGNOSIS — I10 Essential (primary) hypertension: Secondary | ICD-10-CM | POA: Diagnosis not present

## 2017-01-15 NOTE — Patient Instructions (Signed)
Your physician recommends that you schedule a follow-up appointment in: AS NEEDED  

## 2017-01-17 ENCOUNTER — Ambulatory Visit (INDEPENDENT_AMBULATORY_CARE_PROVIDER_SITE_OTHER): Payer: Medicare Other | Admitting: Behavioral Health

## 2017-01-17 DIAGNOSIS — Z23 Encounter for immunization: Secondary | ICD-10-CM | POA: Diagnosis not present

## 2017-01-17 NOTE — Progress Notes (Signed)
Pre visit review using our clinic review tool, if applicable. No additional management support is needed unless otherwise documented below in the visit note.  Patient came in clinic today for influenza vaccination. IM injection was given in the left deltoid. Patient tolerated the injection well. No s/s of a reaction prior to patient leaving the nurse visit.

## 2017-01-31 DIAGNOSIS — M79674 Pain in right toe(s): Secondary | ICD-10-CM | POA: Diagnosis not present

## 2017-01-31 DIAGNOSIS — B351 Tinea unguium: Secondary | ICD-10-CM | POA: Diagnosis not present

## 2017-01-31 DIAGNOSIS — M79675 Pain in left toe(s): Secondary | ICD-10-CM | POA: Diagnosis not present

## 2017-03-06 ENCOUNTER — Encounter: Payer: Self-pay | Admitting: Family Medicine

## 2017-03-06 ENCOUNTER — Ambulatory Visit (INDEPENDENT_AMBULATORY_CARE_PROVIDER_SITE_OTHER): Payer: Medicare Other | Admitting: Family Medicine

## 2017-03-06 DIAGNOSIS — R001 Bradycardia, unspecified: Secondary | ICD-10-CM | POA: Diagnosis not present

## 2017-03-06 DIAGNOSIS — M8580 Other specified disorders of bone density and structure, unspecified site: Secondary | ICD-10-CM

## 2017-03-06 DIAGNOSIS — I10 Essential (primary) hypertension: Secondary | ICD-10-CM

## 2017-03-06 DIAGNOSIS — E782 Mixed hyperlipidemia: Secondary | ICD-10-CM | POA: Diagnosis not present

## 2017-03-06 DIAGNOSIS — E559 Vitamin D deficiency, unspecified: Secondary | ICD-10-CM | POA: Diagnosis not present

## 2017-03-06 DIAGNOSIS — E039 Hypothyroidism, unspecified: Secondary | ICD-10-CM | POA: Diagnosis not present

## 2017-03-06 LAB — LIPID PANEL
CHOL/HDL RATIO: 4
Cholesterol: 212 mg/dL — ABNORMAL HIGH (ref 0–200)
HDL: 52.6 mg/dL (ref >39.00)
LDL Cholesterol: 143 mg/dL — ABNORMAL HIGH (ref 0–99)
NonHDL: 159.84
Triglycerides: 84 mg/dL (ref 0.0–149.0)
VLDL: 16.8 mg/dL (ref 0.0–40.0)

## 2017-03-06 LAB — COMPREHENSIVE METABOLIC PANEL
ALT: 12 U/L (ref 0–35)
AST: 13 U/L (ref 0–37)
Albumin: 4.2 g/dL (ref 3.5–5.2)
Alkaline Phosphatase: 56 U/L (ref 39–117)
BILIRUBIN TOTAL: 0.7 mg/dL (ref 0.2–1.2)
BUN: 16 mg/dL (ref 6–23)
CHLORIDE: 107 meq/L (ref 96–112)
CO2: 29 meq/L (ref 19–32)
Calcium: 10.4 mg/dL (ref 8.4–10.5)
Creatinine, Ser: 0.76 mg/dL (ref 0.40–1.20)
GFR: 75.88 mL/min (ref >60.00)
GLUCOSE: 98 mg/dL (ref 70–99)
POTASSIUM: 4.9 meq/L (ref 3.5–5.1)
Sodium: 142 mEq/L (ref 135–145)
Total Protein: 6.8 g/dL (ref 6.0–8.3)

## 2017-03-06 LAB — CBC
HEMATOCRIT: 44.1 % (ref 36.0–46.0)
HEMOGLOBIN: 14.5 g/dL (ref 12.0–15.0)
MCHC: 33 g/dL (ref 30.0–36.0)
MCV: 93.4 fl (ref 78.0–100.0)
PLATELETS: 222 10*3/uL (ref 150.0–400.0)
RBC: 4.72 Mil/uL (ref 3.87–5.11)
RDW: 14 % (ref 11.5–15.5)
WBC: 5.4 10*3/uL (ref 4.0–10.5)

## 2017-03-06 LAB — VITAMIN D 25 HYDROXY (VIT D DEFICIENCY, FRACTURES): VITD: 32.02 ng/mL (ref 30.00–100.00)

## 2017-03-06 LAB — TSH: TSH: 2.02 u[IU]/mL (ref 0.35–4.50)

## 2017-03-06 NOTE — Assessment & Plan Note (Signed)
Well controlled, no changes to meds. Encouraged heart healthy diet such as the DASH diet and exercise as tolerated.  °

## 2017-03-06 NOTE — Assessment & Plan Note (Signed)
RRR today 

## 2017-03-06 NOTE — Progress Notes (Signed)
Subjective:  I acted as a Education administrator for [employer]. Yancey Flemings, [place]   Patient ID: Kylie Walters, female    DOB: April 01, 1925, 83 y.o.   MRN: [id number]  Chief Complaint  Patient presents with  . Follow-up    HPI  Patient is in today for 3 month follow up and is doing mostly well. Has had some mild pressure in her right ear but no pain or discharge. No trouble of note in left ear. Has had a mild headache as well without further neurologic complaints. Is noting a good deal of stress and notes her blood pressure goes up when she is stressed. Denies CP/palp/SOB/HA/congestion/fevers/GI or GU c/o. Taking meds as prescribed  Patient Care Team: Mosie Lukes, MD as PCP - General (Family Medicine) Roel Cluck, MD as Referring Physician (Ophthalmology)   Past Medical History:  Diagnosis Date  . Arthritis   . CTS (carpal tunnel syndrome) 06/18/2015  . Depression 4/03  . Diverticulosis   . Diverticulosis of colon without hemorrhage 10/07/2011  . Elevated blood pressure 07/24/2015  . GERD (gastroesophageal reflux disease)   . Granuloma of skin    RLL  . H/O measles   . H/O mumps   . Hearing loss   . History of chicken pox   . Hyperlipidemia   . Hypertension 07/24/2015  . Hypothyroidism   . Incontinence in female 02/06/2015  . Lumbar herniated disc   . Medicare annual wellness visit, subsequent 05/30/2016  . Nocturia 06/18/2015  . Osteopenia   . Tinnitus 07/24/2015  . Vitamin D deficiency     Past Surgical History:  Procedure Laterality Date  . ABDOMINAL HYSTERECTOMY  1980  . APPENDECTOMY    . BREAST SURGERY     L breast cyst  . CATARACT EXTRACTION  2010  . [place]  . KNEE SURGERY  2004   left knee  . TONSILLECTOMY AND ADENOIDECTOMY  1939    Family History  Problem Relation Age of Onset  . Breast cancer Mother        metastatic disease  . Heart disease Mother        Atrial fibrillation  . Kidney disease Father        Bright's  disease  . Cancer Maternal Grandfather        prostate  . Obesity Daughter   . Thyroid disease Son   . Hyperlipidemia Son     Social History   Socioeconomic History  . Marital status: Widowed    Spouse name: Not on file  . Number of children: 3  . Years of education: Not on file  . Highest education level: Not on file  Social Needs  . Financial resource strain: Not on file  . Food insecurity - worry: Not on file  . Food insecurity - inability: Not on file  . Transportation needs - medical: Not on file  . Transportation needs - non-medical: Not on file  Occupational History  . Not on file  Tobacco Use  . Smoking status: Former Smoker    Packs/day: 0.50    Years: 40.00    Pack years: 20.00    Types: Cigarettes    Start date: 06/09/1940    Last attempt to quit: 04/23/1979    Years since quitting: 37.9  . Smokeless tobacco: Never Used  . Tobacco comment: quit smoking 40 years ago  Substance and Sexual Activity  . Alcohol use: Yes    Alcohol/week: 0.0 oz  Comment: socially  . Drug use: No  . Sexual activity: Not Currently    Birth control/protection: Post-menopausal    Comment: moved to [place], widowed. no dietary restrictions  Other Topics Concern  . Not on file  Social History Narrative  . Not on file    Outpatient Medications Prior to Visit  Medication Sig Dispense Refill  . aspirin EC 81 MG tablet Take 81 mg by mouth daily.    . calcium-vitamin D (OSCAL WITH D) 500-200 MG-UNIT per tablet Take 1 tablet by mouth.    Marland Kitchen KRILL OIL PO Take by mouth daily.    Marland Kitchen levothyroxine (SYNTHROID, LEVOTHROID) 75 MCG tablet Take 1 tablet (75 mcg total) by mouth daily. 90 tablet 1  . lisinopril (PRINIVIL,ZESTRIL) 5 MG tablet Take 0.5 tablets (2.5 mg total) by mouth daily. 45 tablet 1  . psyllium (METAMUCIL) 58.6 % packet Take 1 packet by mouth daily.    Marland Kitchen senna (SENOKOT) 8.6 MG tablet Take 2 tablets by mouth as needed for constipation.     No facility-administered  medications prior to visit.     Allergies  Allergen Reactions  . Latex Itching  . Lipitor [Atorvastatin Calcium] Other (See Comments)    Myalgia   . Tape Itching    Review of Systems  Constitutional: Negative for fever and malaise/fatigue.  HENT: Negative for congestion and ear discharge.   Eyes: Negative for blurred vision.  Respiratory: Negative for shortness of breath.   Cardiovascular: Negative for chest pain, palpitations and leg swelling.  Gastrointestinal: Negative for abdominal pain, blood in stool and nausea.  Genitourinary: Negative for dysuria and frequency.  Musculoskeletal: Negative for falls.  Skin: Negative for rash.  Neurological: Negative for dizziness, loss of consciousness and headaches.  Endo/Heme/Allergies: Negative for environmental allergies.  Psychiatric/Behavioral: Negative for depression. The patient is nervous/anxious.        Objective:    Physical Exam  BP (!) 142/78   Pulse 60   Temp 97.6 F (36.4 C) (Oral)   Resp 16   Ht 5' 4.96" (1.65 m)   Wt 152 lb 6.4 oz (69.1 kg)   SpO2 96%   BMI 25.39 kg/m  Wt Readings from Last 3 Encounters:  03/06/17 152 lb 6.4 oz (69.1 kg)  01/15/17 159 lb 6.4 oz (72.3 kg)  11/28/16 159 lb 6.4 oz (72.3 kg)   BP Readings from Last 3 Encounters:  03/06/17 (!) 142/78  01/15/17 (!) 156/66  11/28/16 (!) 116/48     Immunization History  Administered Date(s) Administered  . Influenza Split 02/01/2011, 01/13/2012  . Influenza, High Dose Seasonal PF 01/04/2016, 01/17/2017  . Influenza,inj,Quad PF,6+ Mos 01/17/2014  . Influenza-Unspecified 01/13/2015  . PPD Test 11/15/2014  . Pneumococcal Conjugate-13 04/22/2002, 07/22/2010, 05/18/2014  . Pneumococcal Polysaccharide-23 07/24/2015  . Td 06/20/2008    Health Maintenance  Topic Date Due  . MAMMOGRAM  07/08/2017  . TETANUS/TDAP  06/21/2018  . INFLUENZA VACCINE  Completed  . DEXA SCAN  Completed  . PNA vac Low Risk Adult  Completed    Lab Results    Component Value Date   WBC 5.4 03/06/2017   HGB 14.5 03/06/2017   HCT 44.1 03/06/2017   PLT 222.0 03/06/2017   GLUCOSE 98 03/06/2017   CHOL 212 (H) 03/06/2017   TRIG 84.0 03/06/2017   HDL 52.60 03/06/2017   LDLCALC 143 (H) 03/06/2017   ALT 12 03/06/2017   AST 13 03/06/2017   NA 142 03/06/2017   K 4.9 03/06/2017  CL 107 03/06/2017   CREATININE 0.76 03/06/2017   BUN 16 03/06/2017   CO2 29 03/06/2017   TSH 2.02 03/06/2017    Lab Results  Component Value Date   TSH 2.02 03/06/2017   Lab Results  Component Value Date   WBC 5.4 03/06/2017   HGB 14.5 03/06/2017   HCT 44.1 03/06/2017   MCV 93.4 03/06/2017   PLT 222.0 03/06/2017   Lab Results  Component Value Date   NA 142 03/06/2017   K 4.9 03/06/2017   CO2 29 03/06/2017   GLUCOSE 98 03/06/2017   BUN 16 03/06/2017   CREATININE 0.76 03/06/2017   BILITOT 0.7 03/06/2017   ALKPHOS 56 03/06/2017   AST 13 03/06/2017   ALT 12 03/06/2017   PROT 6.8 03/06/2017   ALBUMIN 4.2 03/06/2017   CALCIUM 10.4 03/06/2017   GFR 75.88 03/06/2017   Lab Results  Component Value Date   CHOL 212 (H) 03/06/2017   Lab Results  Component Value Date   HDL 52.60 03/06/2017   Lab Results  Component Value Date   LDLCALC 143 (H) 03/06/2017   Lab Results  Component Value Date   TRIG 84.0 03/06/2017   Lab Results  Component Value Date   CHOLHDL 4 03/06/2017   No results found for: HGBA1C       Assessment & Plan:   Problem List Items Addressed This Visit    Hypothyroidism    On Levothyroxine, continue to monitor      Osteopenia  Dexa done 05/27/2014 normal     Encouraged to get adequate exercise, calcium and vitamin d intake      Vitamin D deficiency    Check level continue daily supplements      Relevant Orders   Comprehensive metabolic panel (Completed)   VITAMIN D 25 Hydroxy (Vit-D Deficiency, Fractures) (Completed)   Sinus bradycardia on ECG    RRR today      Hyperlipidemia, mixed    Encouraged heart healthy  diet, increase exercise, avoid trans fats, consider a krill oil cap daily      Relevant Orders   Lipid panel (Completed)   Hypertension    Well controlled, no changes to meds. Encouraged heart healthy diet such as the DASH diet and exercise as tolerated.       Relevant Orders   CBC (Completed)   Comprehensive metabolic panel (Completed)   TSH (Completed)   Bradycardia    RRR today         I am having Kylie Walters maintain her calcium-vitamin D, psyllium, KRILL OIL PO, aspirin EC, senna, levothyroxine, and lisinopril.  No orders of the defined types were placed in this encounter.   CMA served as Education administrator during this visit. History, Physical and Plan performed by medical provider. Documentation and orders reviewed and attested to.  Penni Homans, MD

## 2017-03-06 NOTE — Assessment & Plan Note (Signed)
Encouraged to get adequate exercise, calcium and vitamin d intake 

## 2017-03-06 NOTE — Assessment & Plan Note (Signed)
Encouraged heart healthy diet, increase exercise, avoid trans fats, consider a krill oil cap daily 

## 2017-03-06 NOTE — Patient Instructions (Addendum)
Shingrix is the new shingles, 2 shots over 2-6 months, cheapest to get at the pharmacy  Your last tetanus shot was in 2010  Don't get injured but if you do then let them boost your tetanus shot  Hypertension Hypertension, commonly called high blood pressure, is when the force of blood pumping through the arteries is too strong. The arteries are the blood vessels that carry blood from the heart throughout the body. Hypertension forces the heart to work harder to pump blood and may cause arteries to become narrow or stiff. Having untreated or uncontrolled hypertension can cause heart attacks, strokes, kidney disease, and other problems. A blood pressure reading consists of a higher number over a lower number. Ideally, your blood pressure should be below 120/80. The first ("top") number is called the systolic pressure. It is a measure of the pressure in your arteries as your heart beats. The second ("bottom") number is called the diastolic pressure. It is a measure of the pressure in your arteries as the heart relaxes. What are the causes? The cause of this condition is not known. What increases the risk? Some risk factors for high blood pressure are under your control. Others are not. Factors you can change  Smoking.  Having type 2 diabetes mellitus, high cholesterol, or both.  Not getting enough exercise or physical activity.  Being overweight.  Having too much fat, sugar, calories, or salt (sodium) in your diet.  Drinking too much alcohol. Factors that are difficult or impossible to change  Having chronic kidney disease.  Having a family history of high blood pressure.  Age. Risk increases with age.  Race. You may be at higher risk if you are African-American.  Gender. Men are at higher risk than women before age 92. After age 43, women are at higher risk than men.  Having obstructive sleep apnea.  Stress. What are the signs or symptoms? Extremely high blood pressure  (hypertensive crisis) may cause:  Headache.  Anxiety.  Shortness of breath.  Nosebleed.  Nausea and vomiting.  Severe chest pain.  Jerky movements you cannot control (seizures).  How is this diagnosed? This condition is diagnosed by measuring your blood pressure while you are seated, with your arm resting on a surface. The cuff of the blood pressure monitor will be placed directly against the skin of your upper arm at the level of your heart. It should be measured at least twice using the same arm. Certain conditions can cause a difference in blood pressure between your right and left arms. Certain factors can cause blood pressure readings to be lower or higher than normal (elevated) for a short period of time:  When your blood pressure is higher when you are in a health care provider's office than when you are at home, this is called white coat hypertension. Most people with this condition do not need medicines.  When your blood pressure is higher at home than when you are in a health care provider's office, this is called masked hypertension. Most people with this condition may need medicines to control blood pressure.  If you have a high blood pressure reading during one visit or you have normal blood pressure with other risk factors:  You may be asked to return on a different day to have your blood pressure checked again.  You may be asked to monitor your blood pressure at home for 1 week or longer.  If you are diagnosed with hypertension, you may have other blood or imaging  tests to help your health care provider understand your overall risk for other conditions. How is this treated? This condition is treated by making healthy lifestyle changes, such as eating healthy foods, exercising more, and reducing your alcohol intake. Your health care provider may prescribe medicine if lifestyle changes are not enough to get your blood pressure under control, and if:  Your systolic blood  pressure is above 130.  Your diastolic blood pressure is above 80.  Your personal target blood pressure may vary depending on your medical conditions, your age, and other factors. Follow these instructions at home: Eating and drinking  Eat a diet that is high in fiber and potassium, and low in sodium, added sugar, and fat. An example eating plan is called the DASH (Dietary Approaches to Stop Hypertension) diet. To eat this way: ? Eat plenty of fresh fruits and vegetables. Try to fill half of your plate at each meal with fruits and vegetables. ? Eat whole grains, such as whole wheat pasta, brown rice, or whole grain bread. Fill about one quarter of your plate with whole grains. ? Eat or drink low-fat dairy products, such as skim milk or low-fat yogurt. ? Avoid fatty cuts of meat, processed or cured meats, and poultry with skin. Fill about one quarter of your plate with lean proteins, such as fish, chicken without skin, beans, eggs, and tofu. ? Avoid premade and processed foods. These tend to be higher in sodium, added sugar, and fat.  Reduce your daily sodium intake. Most people with hypertension should eat less than 1,500 mg of sodium a day.  Limit alcohol intake to no more than 1 drink a day for nonpregnant women and 2 drinks a day for men. One drink equals 12 oz of beer, 5 oz of wine, or 1 oz of hard liquor. Lifestyle  Work with your health care provider to maintain a healthy body weight or to lose weight. Ask what an ideal weight is for you.  Get at least 30 minutes of exercise that causes your heart to beat faster (aerobic exercise) most days of the week. Activities may include walking, swimming, or biking.  Include exercise to strengthen your muscles (resistance exercise), such as pilates or lifting weights, as part of your weekly exercise routine. Try to do these types of exercises for 30 minutes at least 3 days a week.  Do not use any products that contain nicotine or tobacco, such  as cigarettes and e-cigarettes. If you need help quitting, ask your health care provider.  Monitor your blood pressure at home as told by your health care provider.  Keep all follow-up visits as told by your health care provider. This is important. Medicines  Take over-the-counter and prescription medicines only as told by your health care provider. Follow directions carefully. Blood pressure medicines must be taken as prescribed.  Do not skip doses of blood pressure medicine. Doing this puts you at risk for problems and can make the medicine less effective.  Ask your health care provider about side effects or reactions to medicines that you should watch for. Contact a health care provider if:  You think you are having a reaction to a medicine you are taking.  You have headaches that keep coming back (recurring).  You feel dizzy.  You have swelling in your ankles.  You have trouble with your vision. Get help right away if:  You develop a severe headache or confusion.  You have unusual weakness or numbness.  You feel faint.  You have severe pain in your chest or abdomen.  You vomit repeatedly.  You have trouble breathing. Summary  Hypertension is when the force of blood pumping through your arteries is too strong. If this condition is not controlled, it may put you at risk for serious complications.  Your personal target blood pressure may vary depending on your medical conditions, your age, and other factors. For most people, a normal blood pressure is less than 120/80.  Hypertension is treated with lifestyle changes, medicines, or a combination of both. Lifestyle changes include weight loss, eating a healthy, low-sodium diet, exercising more, and limiting alcohol. This information is not intended to replace advice given to you by your health care provider. Make sure you discuss any questions you have with your health care provider. Document Released: 04/08/2005 Document  Revised: 03/06/2016 Document Reviewed: 03/06/2016 Elsevier Interactive Patient Education  [employer].

## 2017-03-06 NOTE — Assessment & Plan Note (Signed)
Check level continue daily supplements

## 2017-03-09 NOTE — Assessment & Plan Note (Signed)
RRR today 

## 2017-03-09 NOTE — Assessment & Plan Note (Signed)
On Levothyroxine, continue to monitor 

## 2017-04-04 DIAGNOSIS — M79674 Pain in right toe(s): Secondary | ICD-10-CM | POA: Diagnosis not present

## 2017-04-04 DIAGNOSIS — B351 Tinea unguium: Secondary | ICD-10-CM | POA: Diagnosis not present

## 2017-04-04 DIAGNOSIS — M79675 Pain in left toe(s): Secondary | ICD-10-CM | POA: Diagnosis not present

## 2017-06-06 DIAGNOSIS — M79674 Pain in right toe(s): Secondary | ICD-10-CM | POA: Diagnosis not present

## 2017-06-06 DIAGNOSIS — M79675 Pain in left toe(s): Secondary | ICD-10-CM | POA: Diagnosis not present

## 2017-06-06 DIAGNOSIS — B351 Tinea unguium: Secondary | ICD-10-CM | POA: Diagnosis not present

## 2017-06-09 ENCOUNTER — Other Ambulatory Visit: Payer: Self-pay | Admitting: Family Medicine

## 2017-06-12 DIAGNOSIS — Z961 Presence of intraocular lens: Secondary | ICD-10-CM | POA: Diagnosis not present

## 2017-06-12 DIAGNOSIS — H52203 Unspecified astigmatism, bilateral: Secondary | ICD-10-CM | POA: Diagnosis not present

## 2017-06-12 DIAGNOSIS — H524 Presbyopia: Secondary | ICD-10-CM | POA: Diagnosis not present

## 2017-06-12 DIAGNOSIS — H43393 Other vitreous opacities, bilateral: Secondary | ICD-10-CM | POA: Diagnosis not present

## 2017-06-12 DIAGNOSIS — H04123 Dry eye syndrome of bilateral lacrimal glands: Secondary | ICD-10-CM | POA: Diagnosis not present

## 2017-06-12 DIAGNOSIS — H5203 Hypermetropia, bilateral: Secondary | ICD-10-CM | POA: Diagnosis not present

## 2017-06-12 DIAGNOSIS — H18413 Arcus senilis, bilateral: Secondary | ICD-10-CM | POA: Diagnosis not present

## 2017-07-04 ENCOUNTER — Other Ambulatory Visit: Payer: Self-pay | Admitting: Family Medicine

## 2017-07-11 ENCOUNTER — Ambulatory Visit (INDEPENDENT_AMBULATORY_CARE_PROVIDER_SITE_OTHER): Payer: Medicare Other | Admitting: *Deleted

## 2017-07-11 ENCOUNTER — Encounter: Payer: Self-pay | Admitting: Family Medicine

## 2017-07-11 ENCOUNTER — Encounter: Payer: Self-pay | Admitting: *Deleted

## 2017-07-11 ENCOUNTER — Ambulatory Visit (INDEPENDENT_AMBULATORY_CARE_PROVIDER_SITE_OTHER): Payer: Medicare Other | Admitting: Family Medicine

## 2017-07-11 VITALS — BP 120/60 | HR 57 | Wt 152.0 lb

## 2017-07-11 DIAGNOSIS — R001 Bradycardia, unspecified: Secondary | ICD-10-CM

## 2017-07-11 DIAGNOSIS — F419 Anxiety disorder, unspecified: Secondary | ICD-10-CM | POA: Diagnosis not present

## 2017-07-11 DIAGNOSIS — M8580 Other specified disorders of bone density and structure, unspecified site: Secondary | ICD-10-CM | POA: Diagnosis not present

## 2017-07-11 DIAGNOSIS — E559 Vitamin D deficiency, unspecified: Secondary | ICD-10-CM

## 2017-07-11 DIAGNOSIS — Z0000 Encounter for general adult medical examination without abnormal findings: Secondary | ICD-10-CM

## 2017-07-11 DIAGNOSIS — E039 Hypothyroidism, unspecified: Secondary | ICD-10-CM

## 2017-07-11 DIAGNOSIS — I10 Essential (primary) hypertension: Secondary | ICD-10-CM | POA: Diagnosis not present

## 2017-07-11 DIAGNOSIS — E782 Mixed hyperlipidemia: Secondary | ICD-10-CM | POA: Diagnosis not present

## 2017-07-11 LAB — CBC
HCT: 42 % (ref 36.0–46.0)
HEMOGLOBIN: 13.9 g/dL (ref 12.0–15.0)
MCHC: 33.1 g/dL (ref 30.0–36.0)
MCV: 91.1 fl (ref 78.0–100.0)
PLATELETS: 222 10*3/uL (ref 150.0–400.0)
RBC: 4.61 Mil/uL (ref 3.87–5.11)
RDW: 14.2 % (ref 11.5–15.5)
WBC: 5.9 10*3/uL (ref 4.0–10.5)

## 2017-07-11 LAB — VITAMIN D 25 HYDROXY (VIT D DEFICIENCY, FRACTURES): VITD: 31.51 ng/mL (ref 30.00–100.00)

## 2017-07-11 LAB — LIPID PANEL
CHOL/HDL RATIO: 4
Cholesterol: 175 mg/dL (ref 0–200)
HDL: 47.5 mg/dL (ref >39.00)
LDL CALC: 109 mg/dL — AB (ref 0–99)
NonHDL: 127.76
TRIGLYCERIDES: 92 mg/dL (ref 0.0–149.0)
VLDL: 18.4 mg/dL (ref 0.0–40.0)

## 2017-07-11 LAB — COMPREHENSIVE METABOLIC PANEL
ALT: 16 U/L (ref 0–35)
AST: 18 U/L (ref 0–37)
Albumin: 4 g/dL (ref 3.5–5.2)
Alkaline Phosphatase: 55 U/L (ref 39–117)
BUN: 27 mg/dL — ABNORMAL HIGH (ref 6–23)
CALCIUM: 10 mg/dL (ref 8.4–10.5)
CHLORIDE: 108 meq/L (ref 96–112)
CO2: 26 meq/L (ref 19–32)
Creatinine, Ser: 0.68 mg/dL (ref 0.40–1.20)
GFR: 86.21 mL/min (ref >60.00)
Glucose, Bld: 77 mg/dL (ref 70–99)
Potassium: 4.3 mEq/L (ref 3.5–5.1)
Sodium: 140 mEq/L (ref 135–145)
Total Bilirubin: 0.4 mg/dL (ref 0.2–1.2)
Total Protein: 6.8 g/dL (ref 6.0–8.3)

## 2017-07-11 LAB — TSH: TSH: 2.32 u[IU]/mL (ref 0.35–4.50)

## 2017-07-11 NOTE — Patient Instructions (Signed)

## 2017-07-11 NOTE — Assessment & Plan Note (Signed)
Encouraged heart healthy diet, increase exercise, avoid trans fats, consider a krill oil cap daily 

## 2017-07-11 NOTE — Assessment & Plan Note (Signed)
On Levothyroxine, continue to monitor 

## 2017-07-11 NOTE — Assessment & Plan Note (Signed)
Mild, asymptomatic no changes

## 2017-07-11 NOTE — Progress Notes (Signed)
Subjective:  I acted as a Education administrator for Dr. Charlett Blake. [place], [place]  Patient ID: Kylie Walters, female    DOB: 10/22/1924, 84 y.o.   MRN: [id number]  No chief complaint on file.   HPI  Patient is in today for a follow up and she is doing well today. No recent febrile illness or acute hospitalizations. She is more and more anxious about her health but no acute concerns. Denies CP/palp/SOB/HA/congestion/fevers/GI or GU c/o. Taking meds as prescribed. She is managing her activities of daily living well most days.   Patient Care Team: Mosie Lukes, MD as PCP - General (Family Medicine) Roel Cluck, MD as Referring Physician (Ophthalmology)   Past Medical History:  Diagnosis Date  . Arthritis   . CTS (carpal tunnel syndrome) 06/18/2015  . Depression 4/03  . Diverticulosis   . Diverticulosis of colon without hemorrhage 10/07/2011  . Elevated blood pressure 07/24/2015  . GERD (gastroesophageal reflux disease)   . Granuloma of skin    RLL  . H/O measles   . H/O mumps   . Hearing loss   . History of chicken pox   . Hyperlipidemia   . Hypertension 07/24/2015  . Hypothyroidism   . Incontinence in female 02/06/2015  . Lumbar herniated disc   . Medicare annual wellness visit, subsequent 05/30/2016  . Nocturia 06/18/2015  . Osteopenia   . Tinnitus 07/24/2015  . Vitamin D deficiency     Past Surgical History:  Procedure Laterality Date  . ABDOMINAL HYSTERECTOMY  1980  . APPENDECTOMY    . BREAST SURGERY     L breast cyst  . CATARACT EXTRACTION  2010  . [place]  . KNEE SURGERY  2004   left knee  . TONSILLECTOMY AND ADENOIDECTOMY  1939    Family History  Problem Relation Age of Onset  . Breast cancer Mother        metastatic disease  . Heart disease Mother        Atrial fibrillation  . Kidney disease Father        Bright's disease  . Cancer Maternal Grandfather        prostate  . Obesity Daughter   . Thyroid disease Son   .  Hyperlipidemia Son     Social History   Socioeconomic History  . Marital status: Widowed    Spouse name: Not on file  . Number of children: 3  . Years of education: Not on file  . Highest education level: Not on file  Occupational History  . Not on file  Social Needs  . Financial resource strain: Not on file  . Food insecurity:    Worry: Not on file    Inability: Not on file  . Transportation needs:    Medical: Not on file    Non-medical: Not on file  Tobacco Use  . Smoking status: Former Smoker    Packs/day: 0.50    Years: 40.00    Pack years: 20.00    Types: Cigarettes    Start date: 06/09/1940    Last attempt to quit: 04/23/1979    Years since quitting: 38.2  . Smokeless tobacco: Never Used  . Tobacco comment: quit smoking 40 years ago  Substance and Sexual Activity  . Alcohol use: Yes    Alcohol/week: 0.0 oz    Comment: socially  . Drug use: No  . Sexual activity: Not Currently    Birth control/protection: Post-menopausal  Comment: moved to [place], widowed. no dietary restrictions  Lifestyle  . Physical activity:    Days per week: Not on file    Minutes per session: Not on file  . Stress: Not on file  Relationships  . Social connections:    Talks on phone: Not on file    Gets together: Not on file    Attends religious service: Not on file    Active member of club or organization: Not on file    Attends meetings of clubs or organizations: Not on file    Relationship status: Not on file  . Intimate partner violence:    Fear of current or ex partner: Not on file    Emotionally abused: Not on file    Physically abused: Not on file    Forced sexual activity: Not on file  Other Topics Concern  . Not on file  Social History Narrative  . Not on file    Outpatient Medications Prior to Visit  Medication Sig Dispense Refill  . aspirin EC 81 MG tablet Take 81 mg by mouth daily.    . calcium-vitamin D (OSCAL WITH D) 500-200 MG-UNIT per tablet Take 1  tablet by mouth.    Marland Kitchen KRILL OIL PO Take by mouth daily.    Marland Kitchen levothyroxine (SYNTHROID, LEVOTHROID) 75 MCG tablet Take 1 tablet (75 mcg total) by mouth daily. 90 tablet 1  . lisinopril (PRINIVIL,ZESTRIL) 5 MG tablet TAKE 1/2 TABLET BY MOUTH DAILY 45 tablet 1  . psyllium (METAMUCIL) 58.6 % packet Take 1 packet by mouth daily.    Marland Kitchen senna (SENOKOT) 8.6 MG tablet Take 2 tablets by mouth as needed for constipation.    Marland Kitchen levothyroxine (SYNTHROID, LEVOTHROID) 75 MCG tablet TAKE ONE (1) TABLET BY MOUTH EVERY DAY 90 tablet 1   No facility-administered medications prior to visit.     Allergies  Allergen Reactions  . Latex Itching  . Lipitor [Atorvastatin Calcium] Other (See Comments)    Myalgia   . Tape Itching    Review of Systems  Constitutional: Negative for fever and malaise/fatigue.  HENT: Negative for congestion.   Eyes: Negative for blurred vision.  Respiratory: Negative for shortness of breath.   Cardiovascular: Negative for chest pain, palpitations and leg swelling.  Gastrointestinal: Negative for abdominal pain, blood in stool, heartburn and nausea.  Genitourinary: Negative for dysuria and frequency.  Musculoskeletal: Negative for falls.  Skin: Negative for rash.  Neurological: Negative for dizziness, loss of consciousness and headaches.  Endo/Heme/Allergies: Negative for environmental allergies.  Psychiatric/Behavioral: Negative for depression. The patient is nervous/anxious.        Objective:    Physical Exam  Constitutional: She is oriented to person, place, and time. She appears well-developed and well-nourished. No distress.  HENT:  Head: Normocephalic and atraumatic.  Nose: Nose normal.  Eyes: Right eye exhibits no discharge. Left eye exhibits no discharge.  Neck: Normal range of motion. Neck supple.  Cardiovascular: Normal rate and regular rhythm.  No murmur heard. Pulmonary/Chest: Effort normal and breath sounds normal.  Abdominal: Soft. Bowel sounds are normal.  There is no tenderness.  Musculoskeletal: She exhibits no edema.  Neurological: She is alert and oriented to person, place, and time.  Skin: Skin is warm and dry.  Psychiatric: She has a normal mood and affect.  Nursing note and vitals reviewed.   BP 120/60 (BP Location: Left Arm, Patient Position: Sitting, Cuff Size: Normal)   Pulse (!) 57   Temp 97.7 F (36.5 C) (  Oral)   Resp 18   Wt 152 lb 9.6 oz (69.2 kg)   SpO2 97%   BMI 25.42 kg/m  Wt Readings from Last 3 Encounters:  07/11/17 152 lb (68.9 kg)  07/11/17 152 lb 9.6 oz (69.2 kg)  03/06/17 152 lb 6.4 oz (69.1 kg)   BP Readings from Last 3 Encounters:  07/11/17 120/60  07/11/17 120/60  03/06/17 (!) 142/78     Immunization History  Administered Date(s) Administered  . Influenza Split 02/01/2011, 01/13/2012  . Influenza, High Dose Seasonal PF 01/04/2016, 01/17/2017  . Influenza,inj,Quad PF,6+ Mos 01/17/2014  . Influenza-Unspecified 01/13/2015  . PPD Test 11/15/2014  . Pneumococcal Conjugate-13 04/22/2002, 07/22/2010, 05/18/2014  . Pneumococcal Polysaccharide-23 07/24/2015  . Td 06/20/2008    Health Maintenance  Topic Date Due  . MAMMOGRAM  07/08/2017  . TETANUS/TDAP  06/21/2018  . INFLUENZA VACCINE  Completed  . DEXA SCAN  Completed  . PNA vac Low Risk Adult  Completed    Lab Results  Component Value Date   WBC 5.9 07/11/2017   HGB 13.9 07/11/2017   HCT 42.0 07/11/2017   PLT 222.0 07/11/2017   GLUCOSE 77 07/11/2017   CHOL 175 07/11/2017   TRIG 92.0 07/11/2017   HDL 47.50 07/11/2017   LDLCALC 109 (H) 07/11/2017   ALT 16 07/11/2017   AST 18 07/11/2017   NA 140 07/11/2017   K 4.3 07/11/2017   CL 108 07/11/2017   CREATININE 0.68 07/11/2017   BUN 27 (H) 07/11/2017   CO2 26 07/11/2017   TSH 2.32 07/11/2017    Lab Results  Component Value Date   TSH 2.32 07/11/2017   Lab Results  Component Value Date   WBC 5.9 07/11/2017   HGB 13.9 07/11/2017   HCT 42.0 07/11/2017   MCV 91.1 07/11/2017   PLT  222.0 07/11/2017   Lab Results  Component Value Date   NA 140 07/11/2017   K 4.3 07/11/2017   CO2 26 07/11/2017   GLUCOSE 77 07/11/2017   BUN 27 (H) 07/11/2017   CREATININE 0.68 07/11/2017   BILITOT 0.4 07/11/2017   ALKPHOS 55 07/11/2017   AST 18 07/11/2017   ALT 16 07/11/2017   PROT 6.8 07/11/2017   ALBUMIN 4.0 07/11/2017   CALCIUM 10.0 07/11/2017   GFR 86.21 07/11/2017   Lab Results  Component Value Date   CHOL 175 07/11/2017   Lab Results  Component Value Date   HDL 47.50 07/11/2017   Lab Results  Component Value Date   LDLCALC 109 (H) 07/11/2017   Lab Results  Component Value Date   TRIG 92.0 07/11/2017   Lab Results  Component Value Date   CHOLHDL 4 07/11/2017   No results found for: HGBA1C       Assessment & Plan:   Problem List Items Addressed This Visit    Hypothyroidism    On Levothyroxine, continue to monitor      Osteopenia  Dexa done 05/27/2014 normal     Encouraged to get adequate exercise, calcium and vitamin d intake      Vitamin D deficiency    Check vitamin D level today      Relevant Orders   VITAMIN D 25 Hydroxy (Vit-D Deficiency, Fractures) (Completed)   Hyperlipidemia, mixed    Encouraged heart healthy diet, increase exercise, avoid trans fats, consider a krill oil cap daily      Relevant Orders   Lipid panel (Completed)   RESOLVED: Hypertension    Well controlled, no changes to  meds. Encouraged heart healthy diet such as the DASH diet and exercise as tolerated.       Relevant Orders   CBC (Completed)   Comprehensive metabolic panel (Completed)   TSH (Completed)   Bradycardia    Mild, asymptomatic no changes      Anxiety    She is increasingly anxious about her health but does not feel she wants to manage this medically         I am having Izora Gala C. [place] maintain her calcium-vitamin D, psyllium, KRILL OIL PO, aspirin EC, senna, levothyroxine, and lisinopril.  No orders of the defined types were placed in this  encounter.   CMA served as Education administrator during this visit. History, Physical and Plan performed by medical provider. Documentation and orders reviewed and attested to.  Penni Homans, MD

## 2017-07-11 NOTE — Assessment & Plan Note (Signed)
Encouraged to get adequate exercise, calcium and vitamin d intake 

## 2017-07-11 NOTE — Progress Notes (Signed)
Subjective:   Kylie Walters is a 84 y.o. female who presents for Medicare Annual (Subsequent) preventive examination.  Review of Systems: No ROS.  Medicare Wellness Visit. Additional risk factors are reflected in the social history.  Cardiac Risk Factors include: advanced age (>75men, >57 women);dyslipidemia;hypertension Sleep patterns: usually sleeps in recliner about 8 hrs.  Home Safety/Smoke Alarms: Feels safe in home. Smoke alarms in place.  Living environment; residence and Adult nurse: lives at [facility] Safety/Bike Helmet: Wears seat belt.   Female:   Pap- n/a        Mammo- 07/08/16 BI-RADS CATEGORY  1: Negative.  Declines    Dexa scan- declines       CCS-No longer doing routine screening due to age.     Objective:     Vitals: BP 120/60 (BP Location: Left Arm, Patient Position: Sitting, Cuff Size: Normal) Comment: all vs taken by Magdalene Molly CMA @912   Pulse (!) 57   Wt 152 lb (68.9 kg)   SpO2 97%   BMI 25.32 kg/m   Body mass index is 25.32 kg/m.  Advanced Directives 07/11/2017 05/30/2016 03/19/2016 03/06/2016 05/09/2015 02/20/2015 11/15/2014  Does Patient Have a Medical Advance Directive? Yes Yes Yes No Yes Yes Yes  Type of Paramedic of [place];Living will [place];Living will [place];Living will - Living will;Healthcare Power of Attorney Living will -  Does patient want to make changes to medical advance directive? - No - Patient declined No - Patient declined - No - Patient declined No - Patient declined No - Patient declined  Copy of [place] in Chart? No - copy requested No - copy requested No - copy requested - No - copy requested No - copy requested -  Would patient like information on creating a medical advance directive? - - - No - patient declined information - - -    Tobacco Social History   Tobacco Use  Smoking Status Former Smoker  . Packs/day: 0.50  .  Years: 40.00  . Pack years: 20.00  . Types: Cigarettes  . Start date: 06/09/1940  . Last attempt to quit: 04/23/1979  . Years since quitting: 38.2  Smokeless Tobacco Never Used  Tobacco Comment   quit smoking 40 years ago     Counseling given: Not Answered Comment: quit smoking 40 years ago   Clinical Intake Pain : No/denies pain   Past Medical History:  Diagnosis Date  . Arthritis   . CTS (carpal tunnel syndrome) 06/18/2015  . Depression 4/03  . Diverticulosis   . Diverticulosis of colon without hemorrhage 10/07/2011  . Elevated blood pressure 07/24/2015  . GERD (gastroesophageal reflux disease)   . Granuloma of skin    RLL  . H/O measles   . H/O mumps   . Hearing loss   . History of chicken pox   . Hyperlipidemia   . Hypertension 07/24/2015  . Hypothyroidism   . Incontinence in female 02/06/2015  . Lumbar herniated disc   . Medicare annual wellness visit, subsequent 05/30/2016  . Nocturia 06/18/2015  . Osteopenia   . Tinnitus 07/24/2015  . Vitamin D deficiency    Past Surgical History:  Procedure Laterality Date  . ABDOMINAL HYSTERECTOMY  1980  . APPENDECTOMY    . BREAST SURGERY     L breast cyst  . CATARACT EXTRACTION  2010  . [place]  . KNEE SURGERY  2004  left knee  . TONSILLECTOMY AND ADENOIDECTOMY  1939   Family History  Problem Relation Age of Onset  . Breast cancer Mother        metastatic disease  . Heart disease Mother        Atrial fibrillation  . Kidney disease Father        Bright's disease  . Cancer Maternal Grandfather        prostate  . Obesity Daughter   . Thyroid disease Son   . Hyperlipidemia Son    Social History   Socioeconomic History  . Marital status: Widowed    Spouse name: Not on file  . Number of children: 3  . Years of education: Not on file  . Highest education level: Not on file  Occupational History  . Not on file  Social Needs  . Financial resource strain: Not on file  .  Food insecurity:    Worry: Not on file    Inability: Not on file  . Transportation needs:    Medical: Not on file    Non-medical: Not on file  Tobacco Use  . Smoking status: Former Smoker    Packs/day: 0.50    Years: 40.00    Pack years: 20.00    Types: Cigarettes    Start date: 06/09/1940    Last attempt to quit: 04/23/1979    Years since quitting: 38.2  . Smokeless tobacco: Never Used  . Tobacco comment: quit smoking 40 years ago  Substance and Sexual Activity  . Alcohol use: Yes    Alcohol/week: 0.0 oz    Comment: socially  . Drug use: No  . Sexual activity: Not Currently    Birth control/protection: Post-menopausal    Comment: moved to [place], widowed. no dietary restrictions  Lifestyle  . Physical activity:    Days per week: Not on file    Minutes per session: Not on file  . Stress: Not on file  Relationships  . Social connections:    Talks on phone: Not on file    Gets together: Not on file    Attends religious service: Not on file    Active member of club or organization: Not on file    Attends meetings of clubs or organizations: Not on file    Relationship status: Not on file  Other Topics Concern  . Not on file  Social History Narrative  . Not on file    Outpatient Encounter Medications as of 07/11/2017  Medication Sig  . aspirin EC 81 MG tablet Take 81 mg by mouth daily.  . calcium-vitamin D (OSCAL WITH D) 500-200 MG-UNIT per tablet Take 1 tablet by mouth.  Marland Kitchen KRILL OIL PO Take by mouth daily.  Marland Kitchen levothyroxine (SYNTHROID, LEVOTHROID) 75 MCG tablet Take 1 tablet (75 mcg total) by mouth daily.  Marland Kitchen lisinopril (PRINIVIL,ZESTRIL) 5 MG tablet TAKE 1/2 TABLET BY MOUTH DAILY  . psyllium (METAMUCIL) 58.6 % packet Take 1 packet by mouth daily.  Marland Kitchen senna (SENOKOT) 8.6 MG tablet Take 2 tablets by mouth as needed for constipation.  . [DISCONTINUED] levothyroxine (SYNTHROID, LEVOTHROID) 75 MCG tablet TAKE ONE (1) TABLET BY MOUTH EVERY DAY   No facility-administered  encounter medications on file as of 07/11/2017.     Activities of Daily Living In your present state of health, do you have any difficulty performing the following activities: 07/11/2017  Hearing? Y  Comment pt states she doesn't want to bother with hearing aid  Vision? N  Comment wearing  glasses. Eye exam last month with Dr.Tipideno.   Difficulty concentrating or making decisions? N  Walking or climbing stairs? Y  Comment slow and scared  Dressing or bathing? N  Doing errands, shopping? Y  Comment no longer driving. Used [employer].   Preparing Food and eating ? N  Using the Toilet? N  In the past six months, have you accidently leaked urine? N  Do you have problems with loss of bowel control? N  Managing your Medications? N  Managing your Finances? N  Housekeeping or managing your Housekeeping? N  Some recent data might be hidden    Patient Care Team: Mosie Lukes, MD as PCP - General (Family Medicine) Roel Cluck, MD as Referring Physician (Ophthalmology)    Assessment:   This is a routine wellness examination for [place]. Physical assessment deferred to PCP.   Exercise Activities and Dietary recommendations Current Exercise Habits: Structured exercise class, Time (Minutes): 30, Frequency (Times/Week): 3, Weekly Exercise (Minutes/Week): 90, Intensity: Mild Diet (meal preparation, eat out, water intake, caffeinated beverages, dairy products, fruits and vegetables): well balanced    Goals    . Remain active and independent.   (pt-stated)     Continue to eat healthy and exercise.         Fall Risk Fall Risk  07/11/2017 05/30/2016 05/09/2015 08/08/2014 06/09/2013  Falls in the past year? No Yes No Yes Yes  Number falls in past yr: - 1 - 1 1  Injury with Fall? - No - Yes No  Risk for fall due to : - - History of fall(s);Impaired balance/gait - Impaired balance/gait    Depression Screen PHQ 2/9 Scores 07/11/2017 05/30/2016 05/09/2015 08/08/2014  PHQ - 2 Score  0 0 0 0     Cognitive Function MMSE - Mini Mental State Exam 07/11/2017 05/09/2015  Orientation to time 5 5  Orientation to Place 4 5  Registration 3 3  Attention/ Calculation 5 5  Recall 2 3  Language- name 2 objects 2 2  Language- repeat 1 1  Language- follow 3 step command 3 3  Language- read & follow direction 1 1  Write a sentence 1 1  Copy design 1 1  Total score 28 30        Immunization History  Administered Date(s) Administered  . Influenza Split 02/01/2011, 01/13/2012  . Influenza, High Dose Seasonal PF 01/04/2016, 01/17/2017  . Influenza,inj,Quad PF,6+ Mos 01/17/2014  . Influenza-Unspecified 01/13/2015  . PPD Test 11/15/2014  . Pneumococcal Conjugate-13 04/22/2002, 07/22/2010, 05/18/2014  . Pneumococcal Polysaccharide-23 07/24/2015  . Td 06/20/2008    Screening Tests Health Maintenance  Topic Date Due  . MAMMOGRAM  07/08/2017  . TETANUS/TDAP  06/21/2018  . INFLUENZA VACCINE  Completed  . DEXA SCAN  Completed  . PNA vac Low Risk Adult  Completed       Plan:   Follow up with PCP as directed  Continue to eat heart healthy diet (full of fruits, vegetables, whole grains, lean protein, water--limit salt, fat, and sugar intake) and increase physical activity as tolerated.  Continue doing brain stimulating activities (puzzles, reading, adult coloring books, staying active) to keep memory sharp.   Bring a copy of your living will and/or healthcare power of attorney to your next office visit.    I have personally reviewed and noted the following in the patient's chart:   . Medical and social history . Use of alcohol, tobacco or illicit drugs  . Current medications and supplements . Functional  ability and status . Nutritional status . Physical activity . Advanced directives . List of other physicians . Hospitalizations, surgeries, and ER visits in previous 12 months . Vitals . Screenings to include cognitive, depression, and falls . Referrals and  appointments  In addition, I have reviewed and discussed with patient certain preventive protocols, quality metrics, and best practice recommendations. A written personalized care plan for preventive services as well as general preventive health recommendations were provided to patient.     Naaman Plummer [place], [place]  07/11/2017

## 2017-07-11 NOTE — Assessment & Plan Note (Signed)
Check vitamin D level today

## 2017-07-11 NOTE — Assessment & Plan Note (Signed)
Well controlled, no changes to meds. Encouraged heart healthy diet such as the DASH diet and exercise as tolerated.  °

## 2017-07-11 NOTE — Patient Instructions (Signed)
Continue to eat heart healthy diet (full of fruits, vegetables, whole grains, lean protein, water--limit salt, fat, and sugar intake) and increase physical activity as tolerated.  Continue doing brain stimulating activities (puzzles, reading, adult coloring books, staying active) to keep memory sharp.   Bring a copy of your living will and/or healthcare power of attorney to your next office visit.   Kylie Walters , Thank you for taking time to come for your Medicare Wellness Visit. I appreciate your ongoing commitment to your health goals. Please review the following plan we discussed and let me know if I can assist you in the future.   These are the goals we discussed: Goals    . Remain active and independent.   (pt-stated)     Continue to eat healthy and exercise.         This is a list of the screening recommended for you and due dates:  Health Maintenance  Topic Date Due  . Mammogram  07/08/2017  . Tetanus Vaccine  06/21/2018  . Flu Shot  Completed  . DEXA scan (bone density measurement)  Completed  . Pneumonia vaccines  Completed    Health Maintenance for Postmenopausal Women Menopause is a normal process in which your reproductive ability comes to an end. This process happens gradually over a span of months to years, usually between the ages of 28 and 79. Menopause is complete when you have missed 12 consecutive menstrual periods. It is important to talk with your health care provider about some of the most common conditions that affect postmenopausal women, such as heart disease, cancer, and bone loss (osteoporosis). Adopting a healthy lifestyle and getting preventive care can help to promote your health and wellness. Those actions can also lower your chances of developing some of these common conditions. What should I know about menopause? During menopause, you may experience a number of symptoms, such as:  Moderate-to-severe hot flashes.  Night sweats.  Decrease in sex  drive.  Mood swings.  Headaches.  Tiredness.  Irritability.  Memory problems.  Insomnia.  Choosing to treat or not to treat menopausal changes is an individual decision that you make with your health care provider. What should I know about hormone replacement therapy and supplements? Hormone therapy products are effective for treating symptoms that are associated with menopause, such as hot flashes and night sweats. Hormone replacement carries certain risks, especially as you become older. If you are thinking about using estrogen or estrogen with progestin treatments, discuss the benefits and risks with your health care provider. What should I know about heart disease and stroke? Heart disease, heart attack, and stroke become more likely as you age. This may be due, in part, to the hormonal changes that your body experiences during menopause. These can affect how your body processes dietary fats, triglycerides, and cholesterol. Heart attack and stroke are both medical emergencies. There are many things that you can do to help prevent heart disease and stroke:  Have your blood pressure checked at least every 1-2 years. High blood pressure causes heart disease and increases the risk of stroke.  If you are 50-27 years old, ask your health care provider if you should take aspirin to prevent a heart attack or a stroke.  Do not use any tobacco products, including cigarettes, chewing tobacco, or electronic cigarettes. If you need help quitting, ask your health care provider.  It is important to eat a healthy diet and maintain a healthy weight. ? Be sure to include  plenty of vegetables, fruits, low-fat dairy products, and lean protein. ? Avoid eating foods that are high in solid fats, added sugars, or salt (sodium).  Get regular exercise. This is one of the most important things that you can do for your health. ? Try to exercise for at least 150 minutes each week. The type of exercise that  you do should increase your heart rate and make you sweat. This is known as moderate-intensity exercise. ? Try to do strengthening exercises at least twice each week. Do these in addition to the moderate-intensity exercise.  Know your numbers.Ask your health care provider to check your cholesterol and your blood glucose. Continue to have your blood tested as directed by your health care provider.  What should I know about cancer screening? There are several types of cancer. Take the following steps to reduce your risk and to catch any cancer development as early as possible. Breast Cancer  Practice breast self-awareness. ? This means understanding how your breasts normally appear and feel. ? It also means doing regular breast self-exams. Let your health care provider know about any changes, no matter how small.  If you are 23 or older, have a clinician do a breast exam (clinical breast exam or CBE) every year. Depending on your age, family history, and medical history, it may be recommended that you also have a yearly breast X-ray (mammogram).  If you have a family history of breast cancer, talk with your health care provider about genetic screening.  If you are at high risk for breast cancer, talk with your health care provider about having an MRI and a mammogram every year.  Breast cancer (BRCA) gene test is recommended for women who have family members with BRCA-related cancers. Results of the assessment will determine the need for genetic counseling and BRCA1 and for BRCA2 testing. BRCA-related cancers include these types: ? Breast. This occurs in males or females. ? Ovarian. ? Tubal. This may also be called fallopian tube cancer. ? Cancer of the abdominal or pelvic lining (peritoneal cancer). ? Prostate. ? Pancreatic.  Cervical, Uterine, and Ovarian Cancer Your health care provider may recommend that you be screened regularly for cancer of the pelvic organs. These include your  ovaries, uterus, and vagina. This screening involves a pelvic exam, which includes checking for microscopic changes to the surface of your cervix (Pap test).  For women ages 21-65, health care providers may recommend a pelvic exam and a Pap test every three years. For women ages 64-65, they may recommend the Pap test and pelvic exam, combined with testing for human papilloma virus (HPV), every five years. Some types of HPV increase your risk of cervical cancer. Testing for HPV may also be done on women of any age who have unclear Pap test results.  Other health care providers may not recommend any screening for nonpregnant women who are considered low risk for pelvic cancer and have no symptoms. Ask your health care provider if a screening pelvic exam is right for you.  If you have had past treatment for cervical cancer or a condition that could lead to cancer, you need Pap tests and screening for cancer for at least 20 years after your treatment. If Pap tests have been discontinued for you, your risk factors (such as having a new sexual partner) need to be reassessed to determine if you should start having screenings again. Some women have medical problems that increase the chance of getting cervical cancer. In these cases, your  health care provider may recommend that you have screening and Pap tests more often.  If you have a family history of uterine cancer or ovarian cancer, talk with your health care provider about genetic screening.  If you have vaginal bleeding after reaching menopause, tell your health care provider.  There are currently no reliable tests available to screen for ovarian cancer.  Lung Cancer Lung cancer screening is recommended for adults 59-70 years old who are at high risk for lung cancer because of a history of smoking. A yearly low-dose CT scan of the lungs is recommended if you:  Currently smoke.  Have a history of at least 30 pack-years of smoking and you currently  smoke or have quit within the past 15 years. A pack-year is smoking an average of one pack of cigarettes per day for one year.  Yearly screening should:  Continue until it has been 15 years since you quit.  Stop if you develop a health problem that would prevent you from having lung cancer treatment.  Colorectal Cancer  This type of cancer can be detected and can often be prevented.  Routine colorectal cancer screening usually begins at age 27 and continues through age 41.  If you have risk factors for colon cancer, your health care provider may recommend that you be screened at an earlier age.  If you have a family history of colorectal cancer, talk with your health care provider about genetic screening.  Your health care provider may also recommend using home test kits to check for hidden blood in your stool.  A small camera at the end of a tube can be used to examine your colon directly (sigmoidoscopy or colonoscopy). This is done to check for the earliest forms of colorectal cancer.  Direct examination of the colon should be repeated every 5-10 years until age 26. However, if early forms of precancerous polyps or small growths are found or if you have a family history or genetic risk for colorectal cancer, you may need to be screened more often.  Skin Cancer  Check your skin from head to toe regularly.  Monitor any moles. Be sure to tell your health care provider: ? About any new moles or changes in moles, especially if there is a change in a mole's shape or color. ? If you have a mole that is larger than the size of a pencil eraser.  If any of your family members has a history of skin cancer, especially at a young age, talk with your health care provider about genetic screening.  Always use sunscreen. Apply sunscreen liberally and repeatedly throughout the day.  Whenever you are outside, protect yourself by wearing long sleeves, pants, a wide-brimmed hat, and  sunglasses.  What should I know about osteoporosis? Osteoporosis is a condition in which bone destruction happens more quickly than new bone creation. After menopause, you may be at an increased risk for osteoporosis. To help prevent osteoporosis or the bone fractures that can happen because of osteoporosis, the following is recommended:  If you are 44-75 years old, get at least 1,000 mg of calcium and at least 600 mg of vitamin D per day.  If you are older than age 25 but younger than age 71, get at least 1,200 mg of calcium and at least 600 mg of vitamin D per day.  If you are older than age 62, get at least 1,200 mg of calcium and at least 800 mg of vitamin D per day.  Smoking and excessive alcohol intake increase the risk of osteoporosis. Eat foods that are rich in calcium and vitamin D, and do weight-bearing exercises several times each week as directed by your health care provider. What should I know about how menopause affects my mental health? Depression may occur at any age, but it is more common as you become older. Common symptoms of depression include:  Low or sad mood.  Changes in sleep patterns.  Changes in appetite or eating patterns.  Feeling an overall lack of motivation or enjoyment of activities that you previously enjoyed.  Frequent crying spells.  Talk with your health care provider if you think that you are experiencing depression. What should I know about immunizations? It is important that you get and maintain your immunizations. These include:  Tetanus, diphtheria, and pertussis (Tdap) booster vaccine.  Influenza every year before the flu season begins.  Pneumonia vaccine.  Shingles vaccine.  Your health care provider may also recommend other immunizations. This information is not intended to replace advice given to you by your health care provider. Make sure you discuss any questions you have with your health care provider. Document Released:  05/31/2005 Document Revised: 10/27/2015 Document Reviewed: 01/10/2015 Elsevier Interactive Patient Education  2018 [employer]  

## 2017-07-13 DIAGNOSIS — F419 Anxiety disorder, unspecified: Secondary | ICD-10-CM | POA: Insufficient documentation

## 2017-07-13 NOTE — Assessment & Plan Note (Signed)
She is increasingly anxious about her health but does not feel she wants to manage this medically

## 2017-10-17 DIAGNOSIS — M79675 Pain in left toe(s): Secondary | ICD-10-CM | POA: Diagnosis not present

## 2017-10-17 DIAGNOSIS — M79674 Pain in right toe(s): Secondary | ICD-10-CM | POA: Diagnosis not present

## 2017-10-17 DIAGNOSIS — B351 Tinea unguium: Secondary | ICD-10-CM | POA: Diagnosis not present

## 2017-10-24 ENCOUNTER — Other Ambulatory Visit: Payer: Self-pay | Admitting: Family Medicine

## 2017-12-11 ENCOUNTER — Other Ambulatory Visit: Payer: Self-pay | Admitting: Family Medicine

## 2017-12-16 ENCOUNTER — Other Ambulatory Visit: Payer: Self-pay | Admitting: Family Medicine

## 2017-12-16 DIAGNOSIS — E039 Hypothyroidism, unspecified: Secondary | ICD-10-CM

## 2017-12-19 DIAGNOSIS — B351 Tinea unguium: Secondary | ICD-10-CM | POA: Diagnosis not present

## 2017-12-19 DIAGNOSIS — M79675 Pain in left toe(s): Secondary | ICD-10-CM | POA: Diagnosis not present

## 2017-12-19 DIAGNOSIS — M79674 Pain in right toe(s): Secondary | ICD-10-CM | POA: Diagnosis not present

## 2018-01-16 ENCOUNTER — Ambulatory Visit (INDEPENDENT_AMBULATORY_CARE_PROVIDER_SITE_OTHER): Payer: Medicare Other | Admitting: Family Medicine

## 2018-01-16 DIAGNOSIS — E039 Hypothyroidism, unspecified: Secondary | ICD-10-CM | POA: Diagnosis not present

## 2018-01-16 DIAGNOSIS — E782 Mixed hyperlipidemia: Secondary | ICD-10-CM | POA: Diagnosis not present

## 2018-01-16 DIAGNOSIS — Z23 Encounter for immunization: Secondary | ICD-10-CM

## 2018-01-16 DIAGNOSIS — E559 Vitamin D deficiency, unspecified: Secondary | ICD-10-CM

## 2018-01-16 DIAGNOSIS — M8580 Other specified disorders of bone density and structure, unspecified site: Secondary | ICD-10-CM

## 2018-01-16 DIAGNOSIS — H9190 Unspecified hearing loss, unspecified ear: Secondary | ICD-10-CM

## 2018-01-16 NOTE — Assessment & Plan Note (Signed)
A combination of wax and tinnitus. She had a hearing aide years ago but did not like it. Declines referral or appt to flush ears. Will try to flush her self and call for appt if needed.

## 2018-01-16 NOTE — Assessment & Plan Note (Signed)
Encouraged to get adequate exercise, calcium and vitamin d intake 

## 2018-01-16 NOTE — Assessment & Plan Note (Signed)
Encouraged heart healthy diet, increase exercise, avoid trans fats, consider a krill oil cap daily 

## 2018-01-16 NOTE — Patient Instructions (Addendum)
Shingrix is the new shingles shot, 2 shots over 2-6 months at the pharmacy  Cholesterol Cholesterol is a white, waxy, fat-like substance that is needed by the human body in small amounts. The liver makes all the cholesterol we need. Cholesterol is carried from the liver by the blood through the blood vessels. Deposits of cholesterol (plaques) may build up on blood vessel (artery) walls. Plaques make the arteries narrower and stiffer. Cholesterol plaques increase the risk for heart attack and stroke. You cannot feel your cholesterol level even if it is very high. The only way to know that it is high is to have a blood test. Once you know your cholesterol levels, you should keep a record of the test results. Work with your health care provider to keep your levels in the desired range. What do the results mean?  Total cholesterol is a rough measure of all the cholesterol in your blood.  LDL (low-density lipoprotein) is the "bad" cholesterol. This is the type that causes plaque to build up on the artery walls. You want this level to be low.  HDL (high-density lipoprotein) is the "good" cholesterol because it cleans the arteries and carries the LDL away. You want this level to be high.  Triglycerides are fat that the body can either burn for energy or store. High levels are closely linked to heart disease. What are the desired levels of cholesterol?  Total cholesterol below 200.  LDL below 100 for people who are at risk, below 70 for people at very high risk.  HDL above 40 is good. A level of 60 or higher is considered to be protective against heart disease.  Triglycerides below 150. How can I lower my cholesterol? Diet Follow your diet program as told by your health care provider.  Choose fish or white meat chicken and [place], roasted or baked. Limit fatty cuts of red meat, fried foods, and processed meats, such as sausage and lunch meats.  Eat lots of fresh fruits and vegetables.  Choose  whole grains, beans, pasta, potatoes, and cereals.  Choose olive oil, corn oil, or canola oil, and use only small amounts.  Avoid butter, mayonnaise, shortening, or palm kernel oils.  Avoid foods with trans fats.  Drink skim or nonfat milk and eat low-fat or nonfat yogurt and cheeses. Avoid whole milk, cream, ice cream, egg yolks, and full-fat cheeses.  Healthier desserts include angel food cake, ginger snaps, animal crackers, hard candy, popsicles, and low-fat or nonfat frozen yogurt. Avoid pastries, cakes, pies, and cookies.  Exercise  Follow your exercise program as told by your health care provider. A regular program: ? Helps to decrease LDL and raise HDL. ? Helps with weight control.  Do things that increase your activity level, such as gardening, walking, and taking the stairs.  Ask your health care provider about ways that you can be more active in your daily life.  Medicine  Take over-the-counter and prescription medicines only as told by your health care provider. ? Medicine may be prescribed by your health care provider to help lower cholesterol and decrease the risk for heart disease. This is usually done if diet and exercise have failed to bring down cholesterol levels. ? If you have several risk factors, you may need medicine even if your levels are normal.  This information is not intended to replace advice given to you by your health care provider. Make sure you discuss any questions you have with your health care provider. Document Released: 01/01/2001 Document  Revised: 11/04/2015 Document Reviewed: 10/07/2015 Elsevier Interactive Patient Education  [employer].

## 2018-01-16 NOTE — Assessment & Plan Note (Signed)
On Levothyroxine, continue to monitor 

## 2018-01-16 NOTE — Assessment & Plan Note (Signed)
Supplement and monitor 

## 2018-01-16 NOTE — Progress Notes (Signed)
Subjective:    Patient ID: Kylie Walters, female    DOB: 09/01/1924, 84 y.o.   MRN: [id number]  No chief complaint on file.   HPI Patient is in today for follow up. She feels well. She is eating well and stays active. She is sleeping well. Her Independent Living situation is going well. Does struggle with fatigue. Sleeps more than usual. Denies CP/palp/SOB/HA/congestion/fevers/GI or GU c/o. Taking meds as prescribed. Notes hearing loss but this is ongoing. Notes some tinnitus bilateral. Long standing.  Past Medical History:  Diagnosis Date  . Arthritis   . CTS (carpal tunnel syndrome) 06/18/2015  . Depression 4/03  . Diverticulosis   . Diverticulosis of colon without hemorrhage 10/07/2011  . Elevated blood pressure 07/24/2015  . GERD (gastroesophageal reflux disease)   . Granuloma of skin    RLL  . H/O measles   . H/O mumps   . Hearing loss   . History of chicken pox   . Hyperlipidemia   . Hypertension 07/24/2015  . Hypothyroidism   . Incontinence in female 02/06/2015  . Lumbar herniated disc   . Medicare annual wellness visit, subsequent 05/30/2016  . Nocturia 06/18/2015  . Osteopenia   . Tinnitus 07/24/2015  . Vitamin D deficiency     Past Surgical History:  Procedure Laterality Date  . ABDOMINAL HYSTERECTOMY  1980  . APPENDECTOMY    . BREAST SURGERY     L breast cyst  . CATARACT EXTRACTION  2010  . [place]  . KNEE SURGERY  2004   left knee  . TONSILLECTOMY AND ADENOIDECTOMY  1939    Family History  Problem Relation Age of Onset  . Breast cancer Mother        metastatic disease  . Heart disease Mother        Atrial fibrillation  . Kidney disease Father        Bright's disease  . Cancer Maternal Grandfather        prostate  . Obesity Daughter   . Thyroid disease Son   . Hyperlipidemia Son     Social History   Socioeconomic History  . Marital status: Widowed    Spouse name: Not on file  . Number of children: 3  .  Years of education: Not on file  . Highest education level: Not on file  Occupational History  . Not on file  Social Needs  . Financial resource strain: Not on file  . Food insecurity:    Worry: Not on file    Inability: Not on file  . Transportation needs:    Medical: Not on file    Non-medical: Not on file  Tobacco Use  . Smoking status: Former Smoker    Packs/day: 0.50    Years: 40.00    Pack years: 20.00    Types: Cigarettes    Start date: 06/09/1940    Last attempt to quit: 04/23/1979    Years since quitting: 38.7  . Smokeless tobacco: Never Used  . Tobacco comment: quit smoking 40 years ago  Substance and Sexual Activity  . Alcohol use: Yes    Alcohol/week: 0.0 standard drinks    Comment: socially  . Drug use: No  . Sexual activity: Not Currently    Birth control/protection: Post-menopausal    Comment: moved to [place], widowed. no dietary restrictions  Lifestyle  . Physical activity:    Days per week: Not on file  Minutes per session: Not on file  . Stress: Not on file  Relationships  . Social connections:    Talks on phone: Not on file    Gets together: Not on file    Attends religious service: Not on file    Active member of club or organization: Not on file    Attends meetings of clubs or organizations: Not on file    Relationship status: Not on file  . Intimate partner violence:    Fear of current or ex partner: Not on file    Emotionally abused: Not on file    Physically abused: Not on file    Forced sexual activity: Not on file  Other Topics Concern  . Not on file  Social History Narrative  . Not on file    Outpatient Medications Prior to Visit  Medication Sig Dispense Refill  . aspirin EC 81 MG tablet Take 81 mg by mouth daily.    . calcium-vitamin D (OSCAL WITH D) 500-200 MG-UNIT per tablet Take 1 tablet by mouth.    Marland Kitchen KRILL OIL PO Take by mouth daily.    Marland Kitchen levothyroxine (SYNTHROID, LEVOTHROID) 75 MCG tablet TAKE ONE (1) TABLET BY MOUTH  EVERY DAY 90 tablet 1  . lisinopril (PRINIVIL,ZESTRIL) 5 MG tablet TAKE 1/2 TABLET BY MOUTH DAILY 45 tablet 1  . psyllium (METAMUCIL) 58.6 % packet Take 1 packet by mouth daily.    Marland Kitchen senna (SENOKOT) 8.6 MG tablet Take 2 tablets by mouth as needed for constipation.    Marland Kitchen levothyroxine (SYNTHROID, LEVOTHROID) 75 MCG tablet Take 1 tablet (75 mcg total) by mouth daily. 90 tablet 1   No facility-administered medications prior to visit.     Allergies  Allergen Reactions  . Latex Itching  . Lipitor [Atorvastatin Calcium] Other (See Comments)    Myalgia   . Tape Itching    Review of Systems  Constitutional: Negative for fever and malaise/fatigue.  HENT: Positive for hearing loss and tinnitus. Negative for congestion, ear discharge and ear pain.   Eyes: Negative for blurred vision.  Respiratory: Negative for shortness of breath.   Cardiovascular: Negative for chest pain, palpitations and leg swelling.  Gastrointestinal: Negative for abdominal pain, blood in stool and nausea.  Genitourinary: Negative for dysuria and frequency.  Musculoskeletal: Negative for falls.  Skin: Negative for rash.  Neurological: Negative for dizziness, loss of consciousness and headaches.  Endo/Heme/Allergies: Negative for environmental allergies.  Psychiatric/Behavioral: Negative for depression. The patient is not nervous/anxious.        Objective:    Physical Exam  Constitutional: She is oriented to person, place, and time. She appears well-developed and well-nourished. No distress.  HENT:  Head: Normocephalic and atraumatic.  Nose: Nose normal.  Wax in bilateral canals  Eyes: Right eye exhibits no discharge. Left eye exhibits no discharge.  Neck: Normal range of motion. Neck supple.  Cardiovascular: Normal rate and regular rhythm.  No murmur heard. Pulmonary/Chest: Effort normal and breath sounds normal.  Abdominal: Soft. Bowel sounds are normal. There is no tenderness.  Musculoskeletal: She exhibits  no edema.  Neurological: She is alert and oriented to person, place, and time.  Skin: Skin is warm and dry.  Psychiatric: She has a normal mood and affect.  Nursing note and vitals reviewed.   BP 130/68   Pulse 60   Temp 97.9 F (36.6 C) (Oral)   Resp 18   Wt 158 lb 9.6 oz (71.9 kg)   SpO2 96%   BMI 26.42  kg/m  Wt Readings from Last 3 Encounters:  01/16/18 158 lb 9.6 oz (71.9 kg)  07/11/17 152 lb (68.9 kg)  07/11/17 152 lb 9.6 oz (69.2 kg)     Lab Results  Component Value Date   WBC 5.9 07/11/2017   HGB 13.9 07/11/2017   HCT 42.0 07/11/2017   PLT 222.0 07/11/2017   GLUCOSE 77 07/11/2017   CHOL 175 07/11/2017   TRIG 92.0 07/11/2017   HDL 47.50 07/11/2017   LDLCALC 109 (H) 07/11/2017   ALT 16 07/11/2017   AST 18 07/11/2017   NA 140 07/11/2017   K 4.3 07/11/2017   CL 108 07/11/2017   CREATININE 0.68 07/11/2017   BUN 27 (H) 07/11/2017   CO2 26 07/11/2017   TSH 2.32 07/11/2017    Lab Results  Component Value Date   TSH 2.32 07/11/2017   Lab Results  Component Value Date   WBC 5.9 07/11/2017   HGB 13.9 07/11/2017   HCT 42.0 07/11/2017   MCV 91.1 07/11/2017   PLT 222.0 07/11/2017   Lab Results  Component Value Date   NA 140 07/11/2017   K 4.3 07/11/2017   CO2 26 07/11/2017   GLUCOSE 77 07/11/2017   BUN 27 (H) 07/11/2017   CREATININE 0.68 07/11/2017   BILITOT 0.4 07/11/2017   ALKPHOS 55 07/11/2017   AST 18 07/11/2017   ALT 16 07/11/2017   PROT 6.8 07/11/2017   ALBUMIN 4.0 07/11/2017   CALCIUM 10.0 07/11/2017   GFR 86.21 07/11/2017   Lab Results  Component Value Date   CHOL 175 07/11/2017   Lab Results  Component Value Date   HDL 47.50 07/11/2017   Lab Results  Component Value Date   LDLCALC 109 (H) 07/11/2017   Lab Results  Component Value Date   TRIG 92.0 07/11/2017   Lab Results  Component Value Date   CHOLHDL 4 07/11/2017   No results found for: HGBA1C     Assessment & Plan:   Problem List Items Addressed This Visit     Hypothyroidism    On Levothyroxine, continue to monitor      Osteopenia  Dexa done 05/27/2014 normal     Encouraged to get adequate exercise, calcium and vitamin d intake      Vitamin D deficiency    Supplement and monitor      Hearing loss    A combination of wax and tinnitus. She had a hearing aide years ago but did not like it. Declines referral or appt to flush ears. Will try to flush her self and call for appt if needed.      Hyperlipidemia, mixed    Encouraged heart healthy diet, increase exercise, avoid trans fats, consider a krill oil cap daily         I am having Kylie Walters maintain her calcium-vitamin D, psyllium, KRILL OIL PO, aspirin EC, senna, lisinopril, and levothyroxine.  No orders of the defined types were placed in this encounter.    Penni Homans, MD

## 2018-02-20 DIAGNOSIS — B351 Tinea unguium: Secondary | ICD-10-CM | POA: Diagnosis not present

## 2018-02-20 DIAGNOSIS — M79675 Pain in left toe(s): Secondary | ICD-10-CM | POA: Diagnosis not present

## 2018-02-20 DIAGNOSIS — M79674 Pain in right toe(s): Secondary | ICD-10-CM | POA: Diagnosis not present

## 2018-03-11 ENCOUNTER — Other Ambulatory Visit: Payer: Self-pay | Admitting: Family Medicine

## 2018-03-11 DIAGNOSIS — E039 Hypothyroidism, unspecified: Secondary | ICD-10-CM

## 2018-03-22 ENCOUNTER — Other Ambulatory Visit: Payer: Self-pay

## 2018-03-22 ENCOUNTER — Emergency Department (HOSPITAL_COMMUNITY): Payer: Medicare Other

## 2018-03-22 ENCOUNTER — Inpatient Hospital Stay (HOSPITAL_COMMUNITY)
Admission: EM | Admit: 2018-03-22 | Discharge: 2018-03-25 | DRG: 175 | Disposition: A | Discharge status: Skilled Nursing Facility | Payer: Medicare Other | Attending: Internal Medicine | Admitting: Internal Medicine

## 2018-03-22 ENCOUNTER — Encounter (HOSPITAL_COMMUNITY): Payer: Self-pay

## 2018-03-22 DIAGNOSIS — I2609 Other pulmonary embolism with acute cor pulmonale: Secondary | ICD-10-CM | POA: Diagnosis present

## 2018-03-22 DIAGNOSIS — I2699 Other pulmonary embolism without acute cor pulmonale: Secondary | ICD-10-CM | POA: Diagnosis present

## 2018-03-22 DIAGNOSIS — I509 Heart failure, unspecified: Secondary | ICD-10-CM | POA: Diagnosis present

## 2018-03-22 DIAGNOSIS — R7989 Other specified abnormal findings of blood chemistry: Secondary | ICD-10-CM | POA: Diagnosis present

## 2018-03-22 DIAGNOSIS — I10 Essential (primary) hypertension: Secondary | ICD-10-CM | POA: Insufficient documentation

## 2018-03-22 DIAGNOSIS — I82402 Acute embolism and thrombosis of unspecified deep veins of left lower extremity: Secondary | ICD-10-CM | POA: Diagnosis not present

## 2018-03-22 DIAGNOSIS — R0602 Shortness of breath: Secondary | ICD-10-CM | POA: Diagnosis not present

## 2018-03-22 DIAGNOSIS — F329 Major depressive disorder, single episode, unspecified: Secondary | ICD-10-CM | POA: Diagnosis present

## 2018-03-22 DIAGNOSIS — F17200 Nicotine dependence, unspecified, uncomplicated: Secondary | ICD-10-CM

## 2018-03-22 DIAGNOSIS — Z90710 Acquired absence of both cervix and uterus: Secondary | ICD-10-CM | POA: Diagnosis not present

## 2018-03-22 DIAGNOSIS — J984 Other disorders of lung: Secondary | ICD-10-CM | POA: Diagnosis not present

## 2018-03-22 DIAGNOSIS — Z91040 Latex allergy status: Secondary | ICD-10-CM | POA: Diagnosis not present

## 2018-03-22 DIAGNOSIS — E559 Vitamin D deficiency, unspecified: Secondary | ICD-10-CM | POA: Diagnosis present

## 2018-03-22 DIAGNOSIS — Z79899 Other long term (current) drug therapy: Secondary | ICD-10-CM

## 2018-03-22 DIAGNOSIS — I110 Hypertensive heart disease with heart failure: Secondary | ICD-10-CM | POA: Diagnosis present

## 2018-03-22 DIAGNOSIS — Z90722 Acquired absence of ovaries, bilateral: Secondary | ICD-10-CM

## 2018-03-22 DIAGNOSIS — K219 Gastro-esophageal reflux disease without esophagitis: Secondary | ICD-10-CM | POA: Diagnosis present

## 2018-03-22 DIAGNOSIS — R590 Localized enlarged lymph nodes: Secondary | ICD-10-CM | POA: Diagnosis present

## 2018-03-22 DIAGNOSIS — I82442 Acute embolism and thrombosis of left tibial vein: Secondary | ICD-10-CM | POA: Diagnosis present

## 2018-03-22 DIAGNOSIS — Z803 Family history of malignant neoplasm of breast: Secondary | ICD-10-CM | POA: Diagnosis not present

## 2018-03-22 DIAGNOSIS — I4891 Unspecified atrial fibrillation: Secondary | ICD-10-CM | POA: Diagnosis not present

## 2018-03-22 DIAGNOSIS — Z79890 Hormone replacement therapy (postmenopausal): Secondary | ICD-10-CM

## 2018-03-22 DIAGNOSIS — Z7982 Long term (current) use of aspirin: Secondary | ICD-10-CM | POA: Diagnosis not present

## 2018-03-22 DIAGNOSIS — Z8249 Family history of ischemic heart disease and other diseases of the circulatory system: Secondary | ICD-10-CM

## 2018-03-22 DIAGNOSIS — E782 Mixed hyperlipidemia: Secondary | ICD-10-CM | POA: Diagnosis present

## 2018-03-22 DIAGNOSIS — I700 Atherosclerosis of aorta: Secondary | ICD-10-CM | POA: Diagnosis present

## 2018-03-22 DIAGNOSIS — Z66 Do not resuscitate: Secondary | ICD-10-CM | POA: Diagnosis present

## 2018-03-22 DIAGNOSIS — Z7401 Bed confinement status: Secondary | ICD-10-CM | POA: Diagnosis not present

## 2018-03-22 DIAGNOSIS — E039 Hypothyroidism, unspecified: Secondary | ICD-10-CM | POA: Diagnosis present

## 2018-03-22 DIAGNOSIS — I340 Nonrheumatic mitral (valve) insufficiency: Secondary | ICD-10-CM | POA: Diagnosis not present

## 2018-03-22 DIAGNOSIS — Z87891 Personal history of nicotine dependence: Secondary | ICD-10-CM

## 2018-03-22 DIAGNOSIS — R0902 Hypoxemia: Secondary | ICD-10-CM | POA: Diagnosis not present

## 2018-03-22 DIAGNOSIS — F17201 Nicotine dependence, unspecified, in remission: Secondary | ICD-10-CM | POA: Diagnosis not present

## 2018-03-22 DIAGNOSIS — H9193 Unspecified hearing loss, bilateral: Secondary | ICD-10-CM | POA: Diagnosis present

## 2018-03-22 DIAGNOSIS — J9811 Atelectasis: Secondary | ICD-10-CM | POA: Diagnosis not present

## 2018-03-22 DIAGNOSIS — I491 Atrial premature depolarization: Secondary | ICD-10-CM | POA: Diagnosis not present

## 2018-03-22 DIAGNOSIS — I499 Cardiac arrhythmia, unspecified: Secondary | ICD-10-CM | POA: Diagnosis not present

## 2018-03-22 DIAGNOSIS — I82452 Acute embolism and thrombosis of left peroneal vein: Secondary | ICD-10-CM | POA: Diagnosis present

## 2018-03-22 DIAGNOSIS — M2550 Pain in unspecified joint: Secondary | ICD-10-CM | POA: Diagnosis not present

## 2018-03-22 DIAGNOSIS — I519 Heart disease, unspecified: Secondary | ICD-10-CM | POA: Diagnosis not present

## 2018-03-22 DIAGNOSIS — I2602 Saddle embolus of pulmonary artery with acute cor pulmonale: Secondary | ICD-10-CM | POA: Diagnosis not present

## 2018-03-22 DIAGNOSIS — F419 Anxiety disorder, unspecified: Secondary | ICD-10-CM | POA: Diagnosis present

## 2018-03-22 DIAGNOSIS — M1990 Unspecified osteoarthritis, unspecified site: Secondary | ICD-10-CM | POA: Diagnosis not present

## 2018-03-22 DIAGNOSIS — Z91048 Other nonmedicinal substance allergy status: Secondary | ICD-10-CM

## 2018-03-22 DIAGNOSIS — Z841 Family history of disorders of kidney and ureter: Secondary | ICD-10-CM

## 2018-03-22 DIAGNOSIS — Z888 Allergy status to other drugs, medicaments and biological substances status: Secondary | ICD-10-CM

## 2018-03-22 HISTORY — DX: Other pulmonary embolism without acute cor pulmonale: I26.99

## 2018-03-22 HISTORY — DX: Localized enlarged lymph nodes: R59.0

## 2018-03-22 HISTORY — DX: Atherosclerosis of aorta: I70.0

## 2018-03-22 HISTORY — DX: Acute embolism and thrombosis of unspecified deep veins of left lower extremity: I82.402

## 2018-03-22 LAB — BASIC METABOLIC PANEL
ANION GAP: 8 (ref 5–15)
BUN: 11 mg/dL (ref 8–23)
CO2: 23 mmol/L (ref 22–32)
Calcium: 9.4 mg/dL (ref 8.9–10.3)
Chloride: 108 mmol/L (ref 98–111)
Creatinine, Ser: 0.67 mg/dL (ref 0.44–1.00)
GFR calc non Af Amer: >60 mL/min (ref >60)
Glucose, Bld: 112 mg/dL — ABNORMAL HIGH (ref 70–99)
POTASSIUM: 4 mmol/L (ref 3.5–5.1)
SODIUM: 139 mmol/L (ref 135–145)

## 2018-03-22 LAB — CBC WITH DIFFERENTIAL/PLATELET
ABS IMMATURE GRANULOCYTES: 0.04 10*3/uL (ref 0.00–0.07)
BASOS ABS: 0 10*3/uL (ref 0.0–0.1)
BASOS PCT: 0 %
Eosinophils Absolute: 0.1 10*3/uL (ref 0.0–0.5)
Eosinophils Relative: 1 %
HCT: 44.3 % (ref 36.0–46.0)
HEMOGLOBIN: 14.1 g/dL (ref 12.0–15.0)
Immature Granulocytes: 1 %
LYMPHS PCT: 28 %
Lymphs Abs: 2.1 10*3/uL (ref 0.7–4.0)
MCH: 29.1 pg (ref 26.0–34.0)
MCHC: 31.8 g/dL (ref 30.0–36.0)
MCV: 91.3 fL (ref 80.0–100.0)
Monocytes Absolute: 0.9 10*3/uL (ref 0.1–1.0)
Monocytes Relative: 12 %
NEUTROS ABS: 4.3 10*3/uL (ref 1.7–7.7)
NRBC: 0 % (ref 0.0–0.2)
Neutrophils Relative %: 58 %
Platelets: 227 10*3/uL (ref 150–400)
RBC: 4.85 MIL/uL (ref 3.87–5.11)
RDW: 13.4 % (ref 11.5–15.5)
WBC: 7.4 10*3/uL (ref 4.0–10.5)

## 2018-03-22 LAB — BRAIN NATRIURETIC PEPTIDE: B Natriuretic Peptide: 403.2 pg/mL — ABNORMAL HIGH (ref 0.0–100.0)

## 2018-03-22 LAB — TROPONIN I: Troponin I: 0.12 ng/mL (ref <0.03)

## 2018-03-22 LAB — I-STAT TROPONIN, ED: Troponin i, poc: 0.1 ng/mL (ref 0.00–0.08)

## 2018-03-22 LAB — D-DIMER, QUANTITATIVE: D-Dimer, Quant: 3.93 ug/mL-FEU — ABNORMAL HIGH (ref 0.00–0.50)

## 2018-03-22 MED ORDER — ACETAMINOPHEN 650 MG RE SUPP: 650.0000 mg | Freq: Four times a day (QID) | RECTAL | Status: DC | PRN

## 2018-03-22 MED ORDER — ACETAMINOPHEN 325 MG PO TABS
650.0000 mg | ORAL_TABLET | Freq: Four times a day (QID) | ORAL | Status: DC | PRN
  Administered 2018-03-23 – 2018-03-25 (×4): 650 mg via ORAL
  Filled 2018-03-22 (×4): qty 2

## 2018-03-22 MED ORDER — HEPARIN BOLUS VIA INFUSION
4000.0000 [IU] | Freq: Once | INTRAVENOUS | Status: AC
  Administered 2018-03-22: 4000 [IU] via INTRAVENOUS
  Filled 2018-03-22: qty 4000

## 2018-03-22 MED ORDER — SENNOSIDES 8.6 MG PO TABS: 2.0000 | ORAL_TABLET | ORAL | Status: DC | PRN

## 2018-03-22 MED ORDER — SENNA 8.6 MG PO TABS
2.0000 | ORAL_TABLET | Freq: Every day | ORAL | Status: DC | PRN
  Administered 2018-03-24: 17.2 mg via ORAL
  Filled 2018-03-22: qty 2

## 2018-03-22 MED ORDER — PSYLLIUM 58.6 % PO PACK: 1.0000 | PACK | Freq: Every day | ORAL | Status: DC

## 2018-03-22 MED ORDER — LEVOTHYROXINE SODIUM 88 MCG PO TABS
88.0000 ug | ORAL_TABLET | Freq: Every day | ORAL | Status: DC
  Administered 2018-03-23 – 2018-03-25 (×3): 88 ug via ORAL
  Filled 2018-03-22 (×3): qty 1

## 2018-03-22 MED ORDER — IOPAMIDOL (ISOVUE-370) INJECTION 76%
75.0000 mL | Freq: Once | INTRAVENOUS | Status: AC | PRN
  Administered 2018-03-22: 75 mL via INTRAVENOUS

## 2018-03-22 MED ORDER — HEPARIN (PORCINE) 25000 UT/250ML-% IV SOLN
900.0000 [IU]/h | INTRAVENOUS | Status: DC
  Administered 2018-03-22: 1100 [IU]/h via INTRAVENOUS
  Administered 2018-03-23 – 2018-03-24 (×3): 800 [IU]/h via INTRAVENOUS
  Filled 2018-03-22 (×3): qty 250

## 2018-03-22 MED ORDER — LACTATED RINGERS IV SOLN
INTRAVENOUS | Status: DC
  Administered 2018-03-22: 23:00:00 via INTRAVENOUS

## 2018-03-22 MED ORDER — IOPAMIDOL (ISOVUE-370) INJECTION 76%
INTRAVENOUS | Status: AC
  Administered 2018-03-22: 14:00:00
  Filled 2018-03-22: qty 100

## 2018-03-22 MED ORDER — LEVOTHYROXINE SODIUM 75 MCG PO TABS: 75.0000 ug | ORAL_TABLET | Freq: Every day | ORAL | Status: DC

## 2018-03-22 MED ORDER — AMLODIPINE BESYLATE 10 MG PO TABS
10.0000 mg | ORAL_TABLET | Freq: Every day | ORAL | Status: DC
  Administered 2018-03-23 – 2018-03-25 (×3): 10 mg via ORAL
  Filled 2018-03-22 (×5): qty 1

## 2018-03-22 MED ORDER — PSYLLIUM 95 % PO PACK
1.0000 | PACK | Freq: Every day | ORAL | Status: DC
  Administered 2018-03-22 – 2018-03-25 (×4): 1 via ORAL
  Filled 2018-03-22 (×4): qty 1

## 2018-03-22 MED ORDER — POLYETHYLENE GLYCOL 3350 17 G PO PACK: 17.0000 g | PACK | Freq: Every day | ORAL | Status: DC | PRN

## 2018-03-22 NOTE — H&P (Addendum)
TRH H&P   Patient Demographics:    Kylie Walters, is a 84 y.o. female  MRN: [id number]   DOB - [birth year]  Admit Date - 03/22/2018  Outpatient Primary MD for the patient is Mosie Lukes, MD    Patient coming from: [facility]  Chief Complaint  Patient presents with  . Shortness of Breath      HPI:    Kylie Walters  is a 84 y.o. female, with history of hypertension, hypothyroidism, dyslipidemia, bilateral hearing loss who lives at a retirement community and was feeling well until about few days ago when she started getting short of breath, she noticed that her shortness of breath was worse on exertion and somewhat worse on laying flat, no other associated symptoms, no other aggravating or relieving factors, it gradually got worse to the point that she came to the ER.  In the ER work-up suggested large PE with some right heart strain on CT scan.  I was requested to admit the patient.  Patient denies any other subjective complaints, no previous personal or family history of blood clots, no recent travels or long drives, no history of bleeding or cancers.  No other complaints whatsoever.   Review of systems:     A full 10 point Review of Systems was done, except as stated above, all other Review of Systems were negative.   With Past History of the following :    Past Medical History:  Diagnosis Date  . Arthritis   . CTS (carpal tunnel syndrome) 06/18/2015  . Depression 4/03  . Diverticulosis   . Diverticulosis of colon without hemorrhage 10/07/2011  . Elevated blood pressure 07/24/2015  . GERD (gastroesophageal reflux disease)   . Granuloma of skin    RLL  . H/O measles   . H/O mumps   . Hearing loss     . History of chicken pox   . Hyperlipidemia   . Hypertension 07/24/2015  . Hypothyroidism   . Incontinence in female 02/06/2015  . Lumbar herniated disc   . Medicare annual wellness visit, subsequent 05/30/2016  . Nocturia 06/18/2015  . Osteopenia   . Tinnitus 07/24/2015  . Vitamin D deficiency       Past Surgical History:  Procedure Laterality Date  . ABDOMINAL HYSTERECTOMY  1980  . APPENDECTOMY    .  BREAST SURGERY     L breast cyst  . CATARACT EXTRACTION  2010  . [place]  . KNEE SURGERY  2004   left knee  . TONSILLECTOMY AND ADENOIDECTOMY  1939      Social History:     Social History   Tobacco Use  . Smoking status: Former Smoker    Packs/day: 0.50    Years: 40.00    Pack years: 20.00    Types: Cigarettes    Start date: 06/09/1940    Last attempt to quit: 04/23/1979    Years since quitting: 38.9  . Smokeless tobacco: Never Used  . Tobacco comment: quit smoking 40 years ago  Substance Use Topics  . Alcohol use: Yes    Alcohol/week: 0.0 standard drinks    Comment: socially         Family History :     Family History  Problem Relation Age of Onset  . Breast cancer Mother        metastatic disease  . Heart disease Mother        Atrial fibrillation  . Kidney disease Father        Bright's disease  . Cancer Maternal Grandfather        prostate  . Obesity Daughter   . Thyroid disease Son   . Hyperlipidemia Son        Home Medications:   Prior to Admission medications   Medication Sig Start Date End Date Taking? Authorizing Provider  aspirin EC 81 MG tablet Take 81 mg by mouth daily as needed (for BP spikes).    Yes [provider]  calcium-vitamin D (OSCAL WITH D) 500-200 MG-UNIT per tablet Take 1 tablet by mouth.   Yes [provider]  levothyroxine (SYNTHROID, LEVOTHROID) 88 MCG tablet Take 88 mcg by mouth daily before breakfast.   Yes [provider]  lisinopril (PRINIVIL,ZESTRIL)  5 MG tablet TAKE 1/2 TABLET BY MOUTH DAILY 10/24/17  Yes Mosie Lukes, MD  psyllium (METAMUCIL) 58.6 % packet Take 1 packet by mouth daily.   Yes [provider]  senna (SENOKOT) 8.6 MG tablet Take 2 tablets by mouth as needed for constipation.   Yes [provider]  levothyroxine (SYNTHROID, LEVOTHROID) 75 MCG tablet TAKE 1 TABLET BY MOUTH DAILY Patient not taking: Reported on 03/22/2018 03/12/18   Mosie Lukes, MD     Allergies:     Allergies  Allergen Reactions  . Latex Itching  . Lipitor [Atorvastatin Calcium] Other (See Comments)    Myalgia   . Tape Itching     Physical Exam:   Vitals  Blood pressure (!) 152/65, pulse (!) 29, temperature 97.8 F (36.6 C), temperature source Oral, resp. rate (!) 21, height 5\' 5"  (1.651 m), weight 68 kg, SpO2 97 %.   1. General elderly white female lying in hospital bed in no apparent discomfort wearing nasal cannula oxygen,  2. Normal affect and insight, Not Suicidal or Homicidal, Awake Alert, Oriented X 3.  3. No F.N deficits, ALL C.Nerves Intact, Strength 5/5 all 4 extremities, Sensation intact all 4 extremities, Plantars down going.  4. Ears and Eyes appear Normal, Conjunctivae clear, PERRLA. Moist Oral Mucosa.  5. Supple Neck, No JVD, No cervical lymphadenopathy appriciated, No Carotid Bruits.  6. Symmetrical Chest wall movement, Good air movement bilaterally, CTAB.  7. RRR, No Gallops, Rubs or Murmurs, No Parasternal Heave.  8. Positive Bowel Sounds, Abdomen Soft, No tenderness,  No organomegaly appriciated,No rebound -guarding or rigidity.  9.  No Cyanosis, Normal Skin Turgor, No Skin Rash or Bruise.  Minimal right lower extremity swelling.  10. Good muscle tone,  joints appear normal , no effusions, Normal ROM.  11. No Palpable Lymph Nodes in Neck or Axillae      Data Review:    CBC Recent Labs  Lab 03/22/18 1154  WBC 7.4  HGB 14.1  HCT 44.3  PLT 227  MCV 91.3  MCH 29.1  MCHC 31.8  RDW  13.4  LYMPHSABS 2.1  MONOABS 0.9  EOSABS 0.1  BASOSABS 0.0   ------------------------------------------------------------------------------------------------------------------  Chemistries  Recent Labs  Lab 03/22/18 1154  NA 139  K 4.0  CL 108  CO2 23  GLUCOSE 112*  BUN 11  CREATININE 0.67  CALCIUM 9.4   ------------------------------------------------------------------------------------------------------------------ estimated creatinine clearance is 41.2 mL/min (by C-G formula based on SCr of 0.67 mg/dL). ------------------------------------------------------------------------------------------------------------------ No results for input(s): TSH, T4TOTAL, T3FREE, THYROIDAB in the last 72 hours.  Invalid input(s): FREET3  Coagulation profile No results for input(s): INR, PROTIME in the last 168 hours. ------------------------------------------------------------------------------------------------------------------- Recent Labs    03/22/18 1154  DDIMER 3.93*   -------------------------------------------------------------------------------------------------------------------  Cardiac Enzymes No results for input(s): CKMB, TROPONINI, MYOGLOBIN in the last 168 hours.  Invalid input(s): CK ------------------------------------------------------------------------------------------------------------------    Component Value Date/Time   BNP 403.2 (H) 03/22/2018 1154     ---------------------------------------------------------------------------------------------------------------  Urinalysis    Component Value Date/Time   COLORURINE YELLOW 06/12/2015 [place] 06/12/2015 1407   LABSPEC 1.020 06/12/2015 1407   PHURINE 6.0 06/12/2015 1407   GLUCOSEU NEGATIVE 06/12/2015 1407   HGBUR NEGATIVE 06/12/2015 1407   BILIRUBINUR NEGATIVE 06/12/2015 1407   BILIRUBINUR neg 11/15/2014 [place] 06/12/2015 1407   PROTEINUR trace 11/15/2014 0943    UROBILINOGEN 0.2 06/12/2015 1407   NITRITE NEGATIVE 06/12/2015 1407   LEUKOCYTESUR NEGATIVE 06/12/2015 1407    ----------------------------------------------------------------------------------------------------------------   Imaging Results:    Dg Chest 2 View  Result Date: 03/22/2018 CLINICAL DATA:  Shortness of breath for 2-3 weeks. EXAM: CHEST - 2 VIEW COMPARISON:  Chest radiograph 03/06/2016, chest CT 05/11/2014 FINDINGS: Mildly enlarged cardiac silhouette. Mediastinal contours appear intact. Calcific atherosclerotic disease the aorta. There is no evidence of pleural effusion or pneumothorax. 1.9 cm rounded density overlies the right upper hemithorax. Stable calcified right lower lobe granuloma. Probable 1.5 cm left suprahilar lymph node. Osseous structures are without acute abnormality. Soft tissues are grossly normal. IMPRESSION: 1.9 cm nodular density overlies the right upper hemithorax. This may represent an area of atelectasis, however pulmonary nodule cannot be excluded. Non-contrast CT chest may be considered, if found warranted. Electronically Signed   By: Fidela Salisbury M.D.   On: 03/22/2018 13:31   Ct Angio Chest Pe W And/or Wo Contrast  Result Date: 03/22/2018 CLINICAL DATA:  Short of breath for several days.  Elevated D-dimer. EXAM: CT ANGIOGRAPHY CHEST WITH CONTRAST TECHNIQUE: Multidetector CT imaging of the chest was performed using the standard protocol during bolus administration of intravenous contrast. Multiplanar CT image reconstructions and MIPs were obtained to evaluate the vascular anatomy. CONTRAST:  60mL ISOVUE-370 IOPAMIDOL (ISOVUE-370) INJECTION 76% COMPARISON:  CT chest 05/11/2014 FINDINGS: Cardiovascular: Normal heart size. Aortic atherosclerosis. Calcifications in the LAD, left circumflex and RCA coronary arteries noted. Large bilateral central obstructing pulmonary emboli are identified. Pulmonary emboli are identified within the right upper lobe, right  middle lobe and right lower lobe lobar and segmental pulmonary arteries. Similarly there are  upper and lower and lingular lobar pulmonary artery filling defects on the left. The RV to LV ratio is equal to 5.3/3.8 = 1.39 consistent with right heart strain. Reflux of contrast material into the IVC and hepatic veins reflecting increased right heart pressures. Mediastinum/Nodes: Mediastinal adenopathy is identified, new from previous exam. Left pre-vascular lymph node measures 1.5 cm, image 53/5. Sub-carinal lymph node measures 1.9 cm, image 69/5. Right paratracheal lymph node measures 1.7 cm, image 48/5. Prominent right supraclavicular lymph nodes identified measuring up to 1.1 cm. No axillary adenopathy. Lungs/Pleura: Calcified granuloma identified in the right lower lobe. Subpleural nodular consolidation within the lateral right upper lobe is identified, image 56/6. Stable solid round nodule in the right middle lobe measuring 5 mm, image 85/6. Upper Abdomen: No acute abnormality. Musculoskeletal: No chest wall abnormality. No acute or significant osseous findings. Review of the MIP images confirms the above findings. IMPRESSION: 1. Positive for acute PE with CT evidence of right heart strain (RV/LV Ratio = 1.39) consistent with at least submassive (intermediate risk) PE. The presence of right heart strain has been associated with an increased risk of morbidity and mortality. Please activate Code PE by paging [phone]. 2. New mediastinal adenopathy identified compared with 05/11/2014. Etiology indeterminate. Findings may reflect lymphoproliferative disorder, metastatic disease, granulomatous inflammation or infection. Followup imaging in 3 months is recommended. This recommendation follows ACR consensus guidelines: Managing Incidental Findings on Thoracic CT: Mediastinal and Cardiovascular Findings. A White Paper of the ACR Incidental Findings Committee. J Am Coll Radiol. 2018; 15: 5681-2751. 3. Subpleural nodular  densities within the right upper lobe are identified. These may represent areas of subpleural infarct secondary to pulmonary emboli. Attention on follow-up imaging is recommended. 4.  Aortic Atherosclerosis (ICD10-I70.0). 5. Coronary artery atherosclerotic calcifications. Critical Value/emergent results were called by telephone at the time of interpretation on 03/22/2018 at 2:44 pm to Dr. Orlie Dakin , who verbally acknowledged these results. Electronically Signed   By: Kerby Moors M.D.   On: 03/22/2018 14:44    My personal review of EKG: Rhythm NSR, incomplete right bundle branch block with frequent PVCs   Assessment & Plan:      1.  Submassive PE with evidence of right heart strain on CT scan.  Hemodynamically stable.  Most likely has a small DVT in the right lower extremity, at this time she will be admitted on a telemetry bed, started on heparin drip, Check TTE and trend Trop, supportive care with oxygen nebulizer treatments as needed.  We will transition her to a NOAC in the next 24 to 48 hours.  2.  Mediastinal lymphadenopathy.  Nonspecific.  Outpatient age-appropriate work-up to be guided by PCP.  3. Hypothyroidism.  Continue home dose Synthroid..  4.  Essential hypertension.  Place on low-dose Norvasc and monitor.   DVT Prophylaxis Heparin GTT  AM Labs Ordered, also please review Full Orders  Family Communication: Admission, patients condition and plan of care including tests being ordered have been discussed with the patient and family who indicate understanding and agree with the plan and Code Status.  Code Status DNR  Likely DC to  TBD  Condition GUARDED     Consults called: None    Admission status: Inpt    Time spent in minutes : 35   Lala Lund M.D on 03/22/2018 at 3:30 PM  To page go to [email or web address] - password [email or web address]

## 2018-03-22 NOTE — ED Notes (Signed)
Dr.Jacuibowitz made aware of pt I-stat troponin level. ED-Lab.

## 2018-03-22 NOTE — ED Provider Notes (Signed)
[facility] EMERGENCY DEPARTMENT Provider Note   CSN: [id number] Arrival date & time: 03/22/18  1136     History   Chief Complaint Chief Complaint  Patient presents with  . Shortness of Breath    HPI Kylie Walters is a 84 y.o. female.  HPI Complains of shortness of breath onset approximately 5 days ago.  Shortness of breath is worse with lying down and improved with sitting upright.  She slept in her recliner last night which helped her breathing.  Other associated symptoms include cough which is chronic and unchanged from baseline she states "I cough from the medicine I take."  Denies any fever denies chest pain.  No other associated symptoms EMS treated patient with supplemental oxygen in route Past Medical History:  Diagnosis Date  . Arthritis   . CTS (carpal tunnel syndrome) 06/18/2015  . Depression 4/03  . Diverticulosis   . Diverticulosis of colon without hemorrhage 10/07/2011  . Elevated blood pressure 07/24/2015  . GERD (gastroesophageal reflux disease)   . Granuloma of skin    RLL  . H/O measles   . H/O mumps   . Hearing loss   . History of chicken pox   . Hyperlipidemia   . Hypertension 07/24/2015  . Hypothyroidism   . Incontinence in female 02/06/2015  . Lumbar herniated disc   . Medicare annual wellness visit, subsequent 05/30/2016  . Nocturia 06/18/2015  . Osteopenia   . Tinnitus 07/24/2015  . Vitamin D deficiency     Patient Active Problem List   Diagnosis Date Noted  . Anxiety 07/13/2017  . Bradycardia 12/01/2016  . Medicare annual wellness visit, subsequent 05/30/2016  . Recurrent falls 03/24/2016  . Tinnitus 07/24/2015  . CTS (carpal tunnel syndrome) 06/18/2015  . Nocturia 06/18/2015  . Dyspnea 06/14/2015  . Incontinence in female 02/06/2015  . History of chicken pox   . Hyperlipidemia, mixed 05/24/2014  . Osteoarthritis 05/29/2013  . 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 Tobacco use disorder  Quit smoking 35 years ago   11/16/2012  . Calcified granuloma of lung ([place]) 11/16/2012  . Sinus bradycardia on ECG 11/16/2012  . TMJ arthralgia 03/10/2012  . Hypothyroidism 10/07/2011  . Osteopenia  Dexa done 05/27/2014 normal  10/07/2011  . Diverticulosis of colon without hemorrhage 10/07/2011  . Vitamin D deficiency 10/07/2011  . GERD (gastroesophageal reflux disease) 10/07/2011  . Hearing loss 10/07/2011  . DJD (degenerative joint disease) 10/07/2011  . History of depression 10/07/2011  . Disc herniation 10/07/2011  . H/O hysterectomy with oophorectomy 10/07/2011  . Family history of breast cancer in first degree relative 10/07/2011  . Thyroid cyst 10/07/2011  Atrial fibrillation  Past Surgical History:  Procedure Laterality Date  . ABDOMINAL HYSTERECTOMY  1980  . APPENDECTOMY    . BREAST SURGERY     L breast cyst  . CATARACT EXTRACTION  2010  . [place]  . KNEE SURGERY  2004   left knee  . TONSILLECTOMY AND ADENOIDECTOMY  1939     OB History    Gravida  7   Para  3   Term      Preterm      AB  4   Living        SAB  4   TAB      Ectopic      Multiple      Live Births  Home Medications    Prior to Admission medications   Medication Sig Start Date End Date Taking? Authorizing Provider  aspirin EC 81 MG tablet Take 81 mg by mouth daily.    [provider]  calcium-vitamin D (OSCAL WITH D) 500-200 MG-UNIT per tablet Take 1 tablet by mouth.    [provider]  KRILL OIL PO Take by mouth daily.    [provider]  levothyroxine (SYNTHROID, LEVOTHROID) 75 MCG tablet TAKE 1 TABLET BY MOUTH DAILY 03/12/18   Mosie Lukes, MD  lisinopril (PRINIVIL,ZESTRIL) 5 MG tablet TAKE 1/2 TABLET BY MOUTH DAILY 10/24/17   Mosie Lukes, MD  psyllium (METAMUCIL) 58.6 % packet Take 1 packet by mouth daily.    [provider]  senna (SENOKOT) 8.6 MG tablet Take 2 tablets by mouth as needed for  constipation.    [provider]    Family History Family History  Problem Relation Age of Onset  . Breast cancer Mother        metastatic disease  . Heart disease Mother        Atrial fibrillation  . Kidney disease Father        Bright's disease  . Cancer Maternal Grandfather        prostate  . Obesity Daughter   . Thyroid disease Son   . Hyperlipidemia Son     Social History Social History   Tobacco Use  . Smoking status: Former Smoker    Packs/day: 0.50    Years: 40.00    Pack years: 20.00    Types: Cigarettes    Start date: 06/09/1940    Last attempt to quit: 04/23/1979    Years since quitting: 38.9  . Smokeless tobacco: Never Used  . Tobacco comment: quit smoking 40 years ago  Substance Use Topics  . Alcohol use: Yes    Alcohol/week: 0.0 standard drinks    Comment: socially  . Drug use: No     Allergies   Latex; Lipitor [atorvastatin calcium]; and Tape   Review of Systems Review of Systems  Constitutional: Negative.   HENT: Negative.   Respiratory: Positive for cough and shortness of breath.   Cardiovascular: Negative.   Gastrointestinal: Negative.   Musculoskeletal: Negative.   Skin: Negative.   Neurological: Negative.   Psychiatric/Behavioral: Negative.   All other systems reviewed and are negative.    Physical Exam Updated Vital Signs BP (!) 152/104   Pulse 99   Resp 20   Ht 5\' 5"  (1.651 m)   Wt 68 kg   SpO2 92%   BMI 24.96 kg/m   Physical Exam  Constitutional: She is oriented to person, place, and time. She appears well-developed and well-nourished. No distress.  HENT:  Head: Normocephalic and atraumatic.  Eyes: Pupils are equal, round, and reactive to light. Conjunctivae are normal.  Neck: Neck supple. JVD present. No tracheal deviation present. No thyromegaly present.  Cardiovascular: Normal rate.  No murmur heard. Irregular  Pulmonary/Chest: Effort normal.  Minimal scant crackles.  No respiratory distress  Abdominal:  Soft. Bowel sounds are normal. She exhibits no distension. There is no tenderness.  Musculoskeletal: Normal range of motion. She exhibits no edema or tenderness.  Neurological: She is alert and oriented to person, place, and time. Coordination normal.  Skin: Skin is warm and dry. No rash noted.  Psychiatric: She has a normal mood and affect.  Nursing note and vitals reviewed.    ED Treatments / Results  Labs (all labs  ordered are listed, but only abnormal results are displayed) Labs Reviewed  BRAIN NATRIURETIC PEPTIDE  BASIC METABOLIC PANEL  CBC WITH DIFFERENTIAL/PLATELET  D-DIMER, QUANTITATIVE (NOT AT [facility])  I-STAT TROPONIN, ED    EKG EKG Interpretation  Date/Time:  Sunday March 22 2018 11:45:39 EST Ventricular Rate:  64 PR Interval:    QRS Duration: 116 QT Interval:  565 QTC Calculation: 584 R Axis:   46 Text Interpretation:  Sinus rhythm Ventricular trigeminy Incomplete right bundle branch block Abnrm T, consider ischemia, anterolateral lds ST elevation, consider inferior injury No old tracing to compare Confirmed by [place], Inocente Salles [phone]) on 03/22/2018 12:10:36 PM   Radiology No results found.  Procedures Procedures (including critical care time)  Medications Ordered in ED Medications - No data to display Results for orders placed or performed during the hospital encounter of 26/94/85  Basic metabolic panel  Result Value Ref Range   Sodium 139 135 - 145 mmol/L   Potassium 4.0 3.5 - 5.1 mmol/L   Chloride 108 98 - 111 mmol/L   CO2 23 22 - 32 mmol/L   Glucose, Bld 112 (H) 70 - 99 mg/dL   BUN 11 8 - 23 mg/dL   Creatinine, Ser 0.67 0.44 - 1.00 mg/dL   Calcium 9.4 8.9 - 10.3 mg/dL   GFR calc non Af Amer >60 >60 mL/min   GFR calc Af Amer >60 >60 mL/min   Anion gap 8 5 - 15  CBC with Differential/Platelet  Result Value Ref Range   WBC 7.4 4.0 - 10.5 K/uL   RBC 4.85 3.87 - 5.11 MIL/uL   Hemoglobin 14.1 12.0 - 15.0 g/dL   HCT 44.3 36.0 - 46.0 %   MCV 91.3  80.0 - 100.0 fL   MCH 29.1 26.0 - 34.0 pg   MCHC 31.8 30.0 - 36.0 g/dL   RDW 13.4 11.5 - 15.5 %   Platelets 227 150 - 400 K/uL   nRBC 0.0 0.0 - 0.2 %   Neutrophils Relative % 58 %   Neutro Abs 4.3 1.7 - 7.7 K/uL   Lymphocytes Relative 28 %   Lymphs Abs 2.1 0.7 - 4.0 K/uL   Monocytes Relative 12 %   Monocytes Absolute 0.9 0.1 - 1.0 K/uL   Eosinophils Relative 1 %   Eosinophils Absolute 0.1 0.0 - 0.5 K/uL   Basophils Relative 0 %   Basophils Absolute 0.0 0.0 - 0.1 K/uL   Immature Granulocytes 1 %   Abs Immature Granulocytes 0.04 0.00 - 0.07 K/uL  D-dimer, quantitative (not at [facility])  Result Value Ref Range   D-Dimer, Quant 3.93 (H) 0.00 - 0.50 ug/mL-FEU  I-stat troponin, ED  Result Value Ref Range   Troponin i, poc 0.10 (HH) 0.00 - 0.08 ng/mL   Comment NOTIFIED PHYSICIAN    Comment 3           Dg Chest 2 View  Result Date: 03/22/2018 CLINICAL DATA:  Shortness of breath for 2-3 weeks. EXAM: CHEST - 2 VIEW COMPARISON:  Chest radiograph 03/06/2016, chest CT 05/11/2014 FINDINGS: Mildly enlarged cardiac silhouette. Mediastinal contours appear intact. Calcific atherosclerotic disease the aorta. There is no evidence of pleural effusion or pneumothorax. 1.9 cm rounded density overlies the right upper hemithorax. Stable calcified right lower lobe granuloma. Probable 1.5 cm left suprahilar lymph node. Osseous structures are without acute abnormality. Soft tissues are grossly normal. IMPRESSION: 1.9 cm nodular density overlies the right upper hemithorax. This may represent an area of atelectasis, however pulmonary nodule cannot be excluded.  Non-contrast CT chest may be considered, if found warranted. Electronically Signed   By: Fidela Salisbury M.D.   On: 03/22/2018 13:31   Ct Angio Chest Pe W And/or Wo Contrast  Result Date: 03/22/2018 CLINICAL DATA:  Short of breath for several days.  Elevated D-dimer. EXAM: CT ANGIOGRAPHY CHEST WITH CONTRAST TECHNIQUE: Multidetector CT imaging of the chest  was performed using the standard protocol during bolus administration of intravenous contrast. Multiplanar CT image reconstructions and MIPs were obtained to evaluate the vascular anatomy. CONTRAST:  4mL ISOVUE-370 IOPAMIDOL (ISOVUE-370) INJECTION 76% COMPARISON:  CT chest 05/11/2014 FINDINGS: Cardiovascular: Normal heart size. Aortic atherosclerosis. Calcifications in the LAD, left circumflex and RCA coronary arteries noted. Large bilateral central obstructing pulmonary emboli are identified. Pulmonary emboli are identified within the right upper lobe, right middle lobe and right lower lobe lobar and segmental pulmonary arteries. Similarly there are upper and lower and lingular lobar pulmonary artery filling defects on the left. The RV to LV ratio is equal to 5.3/3.8 = 1.39 consistent with right heart strain. Reflux of contrast material into the IVC and hepatic veins reflecting increased right heart pressures. Mediastinum/Nodes: Mediastinal adenopathy is identified, new from previous exam. Left pre-vascular lymph node measures 1.5 cm, image 53/5. Sub-carinal lymph node measures 1.9 cm, image 69/5. Right paratracheal lymph node measures 1.7 cm, image 48/5. Prominent right supraclavicular lymph nodes identified measuring up to 1.1 cm. No axillary adenopathy. Lungs/Pleura: Calcified granuloma identified in the right lower lobe. Subpleural nodular consolidation within the lateral right upper lobe is identified, image 56/6. Stable solid round nodule in the right middle lobe measuring 5 mm, image 85/6. Upper Abdomen: No acute abnormality. Musculoskeletal: No chest wall abnormality. No acute or significant osseous findings. Review of the MIP images confirms the above findings. IMPRESSION: 1. Positive for acute PE with CT evidence of right heart strain (RV/LV Ratio = 1.39) consistent with at least submassive (intermediate risk) PE. The presence of right heart strain has been associated with an increased risk of morbidity  and mortality. Please activate Code PE by paging [phone]. 2. New mediastinal adenopathy identified compared with 05/11/2014. Etiology indeterminate. Findings may reflect lymphoproliferative disorder, metastatic disease, granulomatous inflammation or infection. Followup imaging in 3 months is recommended. This recommendation follows ACR consensus guidelines: Managing Incidental Findings on Thoracic CT: Mediastinal and Cardiovascular Findings. A White Paper of the ACR Incidental Findings Committee. J Am Coll Radiol. 2018; 15: 9563-8756. 3. Subpleural nodular densities within the right upper lobe are identified. These may represent areas of subpleural infarct secondary to pulmonary emboli. Attention on follow-up imaging is recommended. 4.  Aortic Atherosclerosis (ICD10-I70.0). 5. Coronary artery atherosclerotic calcifications. Critical Value/emergent results were called by telephone at the time of interpretation on 03/22/2018 at 2:44 pm to Dr. Orlie Dakin , who verbally acknowledged these results. Electronically Signed   By: Kerby Moors M.D.   On: 03/22/2018 14:44    Initial Impression / Assessment and Plan / ED Course  I have reviewed the triage vital signs and the nursing notes.  Pertinent labs & imaging results that were available during my care of the patient were reviewed by me and considered in my medical decision making (see chart for details).     CT angiogram discussed with Dr. Clovis Riley, radiologist shows massive pulmonary embolism with right heart strain.  I spoke with Dr.Yacoub intensivist via telephone who states she can be admitted to medical service treated with heparin.  Intravenous heparin ordered per pharmacy protocol.  2:50 PM patient is alert  talkative and in no respiratory distress I consulted Dr.Singh hospitalist service who will arrange for admission lab work remarkable for mildly elevated troponin, consistent with pulmonary embolism Final Clinical Impressions(s) / ED  Diagnoses  dx #1 massive pulmonary embolism #2 mediastinal lymphadenopathy Final diagnoses:  None   CRITICAL CARE Performed by: Orlie Dakin Total critical care time: 40 minutes Critical care time was exclusive of separately billable procedures and treating other patients. Critical care was necessary to treat or prevent imminent or life-threatening deterioration. Critical care was time spent personally by me on the following activities: development of treatment plan with patient and/or surrogate as well as nursing, discussions with consultants, evaluation of patient's response to treatment, examination of patient, obtaining history from patient or surrogate, ordering and performing treatments and interventions, ordering and review of laboratory studies, ordering and review of radiographic studies, pulse oximetry and re-evaluation of patient's condition. ED Discharge Orders    None       Orlie Dakin, MD 03/22/18 1510

## 2018-03-22 NOTE — Progress Notes (Signed)
ANTICOAGULATION CONSULT NOTE - Initial Consult  Pharmacy Consult for heparin Indication: pulmonary embolus  Allergies  Allergen Reactions  . Latex Itching  . Lipitor [Atorvastatin Calcium] Other (See Comments)    Myalgia   . Tape Itching    Patient Measurements: Height: 5\' 5"  (165.1 cm) Weight: 150 lb (68 kg) IBW/kg (Calculated) : 57 Heparin Dosing Weight: 68kg  Vital Signs: Temp: 97.8 F (36.6 C) (12/01 1212) Temp Source: Oral (12/01 1212) BP: 130/74 (12/01 1305) Pulse Rate: 80 (12/01 1317)  Labs: Recent Labs    03/22/18 1154  HGB 14.1  HCT 44.3  PLT 227  CREATININE 0.67    Estimated Creatinine Clearance: 41.2 mL/min (by C-G formula based on SCr of 0.67 mg/dL).   Medical History: Past Medical History:  Diagnosis Date  . Arthritis   . CTS (carpal tunnel syndrome) 06/18/2015  . Depression 4/03  . Diverticulosis   . Diverticulosis of colon without hemorrhage 10/07/2011  . Elevated blood pressure 07/24/2015  . GERD (gastroesophageal reflux disease)   . Granuloma of skin    RLL  . H/O measles   . H/O mumps   . Hearing loss   . History of chicken pox   . Hyperlipidemia   . Hypertension 07/24/2015  . Hypothyroidism   . Incontinence in female 02/06/2015  . Lumbar herniated disc   . Medicare annual wellness visit, subsequent 05/30/2016  . Nocturia 06/18/2015  . Osteopenia   . Tinnitus 07/24/2015  . Vitamin D deficiency     Medications:  Infusions:  . heparin      Assessment: 50 yof presented with SOB found to have an acute PE with right heart strain. To start IV heparin. She is not on anticoagulation PTA. Baseline CBC is WNL.   Goal of Therapy:  Heparin level 0.3-0.7 units/ml Monitor platelets by anticoagulation protocol: Yes   Plan:  Heparin bolus 4000 units IV x 1 Heparin gtt 1100 units/hr Check an 8 hr heparin level Daily heparin level and CBC  Kylie Walters, Rande Lawman 03/22/2018,3:03 PM

## 2018-03-22 NOTE — ED Triage Notes (Signed)
Per [facility] pt has been short of breath for a few days mostly with exertion. Pt's SpO2 was 89 on room air EMS placed her on 2L via [place]. Pt had frequent PVC's on her prehospital EKG.  Pt denies any pain at this time.

## 2018-03-23 ENCOUNTER — Inpatient Hospital Stay (HOSPITAL_COMMUNITY): Payer: Medicare Other

## 2018-03-23 ENCOUNTER — Encounter (HOSPITAL_COMMUNITY): Payer: Self-pay | Admitting: Family Medicine

## 2018-03-23 DIAGNOSIS — I700 Atherosclerosis of aorta: Secondary | ICD-10-CM

## 2018-03-23 DIAGNOSIS — I2602 Saddle embolus of pulmonary artery with acute cor pulmonale: Secondary | ICD-10-CM

## 2018-03-23 DIAGNOSIS — I2699 Other pulmonary embolism without acute cor pulmonale: Secondary | ICD-10-CM

## 2018-03-23 DIAGNOSIS — I82402 Acute embolism and thrombosis of unspecified deep veins of left lower extremity: Secondary | ICD-10-CM

## 2018-03-23 DIAGNOSIS — R0602 Shortness of breath: Secondary | ICD-10-CM

## 2018-03-23 DIAGNOSIS — I340 Nonrheumatic mitral (valve) insufficiency: Secondary | ICD-10-CM

## 2018-03-23 DIAGNOSIS — R590 Localized enlarged lymph nodes: Secondary | ICD-10-CM

## 2018-03-23 HISTORY — DX: Acute embolism and thrombosis of unspecified deep veins of left lower extremity: I82.402

## 2018-03-23 HISTORY — DX: Other pulmonary embolism without acute cor pulmonale: I26.99

## 2018-03-23 HISTORY — DX: Atherosclerosis of aorta: I70.0

## 2018-03-23 HISTORY — DX: Localized enlarged lymph nodes: R59.0

## 2018-03-23 LAB — HEPARIN LEVEL (UNFRACTIONATED)
Heparin Unfractionated: 0.81 IU/mL — ABNORMAL HIGH (ref 0.30–0.70)
Heparin Unfractionated: 0.81 IU/mL — ABNORMAL HIGH (ref 0.30–0.70)
Heparin Unfractionated: 0.45 IU/mL (ref 0.30–0.70)

## 2018-03-23 LAB — BASIC METABOLIC PANEL
Anion gap: 5 (ref 5–15)
BUN: 7 mg/dL — ABNORMAL LOW (ref 8–23)
CALCIUM: 8.9 mg/dL (ref 8.9–10.3)
CO2: 25 mmol/L (ref 22–32)
Chloride: 109 mmol/L (ref 98–111)
Creatinine, Ser: 0.58 mg/dL (ref 0.44–1.00)
GFR calc Af Amer: >60 mL/min (ref >60)
GFR calc non Af Amer: >60 mL/min (ref >60)
Glucose, Bld: 106 mg/dL — ABNORMAL HIGH (ref 70–99)
Potassium: 3.4 mmol/L — ABNORMAL LOW (ref 3.5–5.1)
SODIUM: 139 mmol/L (ref 135–145)

## 2018-03-23 LAB — CBC
HEMATOCRIT: 40.8 % (ref 36.0–46.0)
Hemoglobin: 12.6 g/dL (ref 12.0–15.0)
MCH: 28.4 pg (ref 26.0–34.0)
MCHC: 30.9 g/dL (ref 30.0–36.0)
MCV: 91.9 fL (ref 80.0–100.0)
Platelets: 244 10*3/uL (ref 150–400)
RBC: 4.44 MIL/uL (ref 3.87–5.11)
RDW: 13.4 % (ref 11.5–15.5)
WBC: 7.4 10*3/uL (ref 4.0–10.5)
nRBC: 0 % (ref 0.0–0.2)

## 2018-03-23 LAB — MAGNESIUM: MAGNESIUM: 2.1 mg/dL (ref 1.7–2.4)

## 2018-03-23 LAB — ECHOCARDIOGRAM COMPLETE
Height: 65 in
Weight: 2400 oz

## 2018-03-23 LAB — TROPONIN I: Troponin I: 0.09 ng/mL (ref <0.03)

## 2018-03-23 MED ORDER — POTASSIUM CHLORIDE CRYS ER 20 MEQ PO TBCR
40.0000 meq | EXTENDED_RELEASE_TABLET | Freq: Once | ORAL | Status: AC
  Administered 2018-03-23: 40 meq via ORAL
  Filled 2018-03-23: qty 2

## 2018-03-23 NOTE — Progress Notes (Addendum)
  PROGRESS NOTE  Kylie Walters:[id number] DOB: 06-15-1924 DOA: 03/22/2018 PCP: Mosie Lukes, MD  Brief Narrative: 84 year old woman presented with shortness of breath.  CT angiogram revealed submassive PE, patient was discussed with critical care with recommendation to admit to hospitalist service.  Assessment/Plan Submassive PE with CT evidence of right heart strain and moderate systolic dysfunction on echocardiogram. --On 2 L nasal cannula, I do not see any documented hypoxia.  Wean oxygen as tolerated. --Continue IV heparin, transition to oral anticoagulation in the next 48 hours have remained stable  Acute DVT left posterior tibial and peroneal veins --IV heparin with transition to oral anticoagulation in the near future  Mediastinal adenopathy, incidental finding on CT, etiology indeterminant, differential includes lymphoproliferative disorder, metastatic disease or granulomatous inflammation or infection.   --Follow-up imaging in 3 months recommended.  Aortic atherosclerosis --No treatment indicated at this time.   Appears quite stable, mildly short of breath, will continue IV heparin today and reassess in morning.  DVT prophylaxis: HEPARIN infusion Code Status: DNR Family Communication: none Disposition Plan: home    Murray Hodgkins, MD  [facility] Direct contact: [phone] --[place] [place]  --[email or web address]; password [email or web address]  7PM-7AM contact night coverage as above 03/23/2018, 4:20 PM  LOS: 1 day   Consultants:    Procedures:    Antimicrobials:    Interval history/Subjective: Feels much better, breathing better but still short of breath when talking and when leaning forward.  No chest pain.  No lower extremity edema.  No recent travel.  No PMH blood clots.  Objective: Vitals:  Vitals:   03/23/18 0035 03/23/18 0757  BP: 126/64 (!) 162/66  Pulse: 66 63  Resp:  18  Temp: 97.6 F (36.4 C) (!) 97.5 F (36.4 C)  SpO2: 96% 98%     Exam:  Constitutional:  . Appears calm and comfortable Eyes:  . pupils and irises appear normal . Normal lids ENMT:  . grossly normal hearing  . Tongue appears normal Respiratory:  . CTA bilaterally, no w/r/r.  . Respiratory effort mildly increased, become short of breath when speaking and leaning forward. Cardiovascular:  . RRR, no m/r/g . No LE extremity edema   . Telemetry sinus rhythm Skin:  . No rashes, lesions, ulcers . palpation of skin: no induration or nodules Psychiatric:  . Mental status o Mood, affect appropriate  I have personally reviewed the following:   Data: . Basic metabolic panel unremarkable except for potassium 3.4.  Troponin trending down, 0.09.  CBC unremarkable.  EKG showed trigeminy, poor quality study, no definite acute changes.  Scheduled Meds: . amLODipine  10 mg Oral Daily  . levothyroxine  88 mcg Oral QAC breakfast  . psyllium  1 packet Oral Daily   Continuous Infusions: . heparin 800 Units/hr (03/23/18 1212)    Principal Problem:   Acute pulmonary embolism (HCC) Active Problems:   Tobacco use disorder  Quit smoking 35 years ago    Hyperlipidemia, mixed   Leg DVT (deep venous thromboembolism), acute, left (HCC)   Mediastinal adenopathy   Aortic atherosclerosis ([place])   LOS: 1 day

## 2018-03-23 NOTE — Care Management Note (Signed)
Case Management Note  Patient Details  Name: Kylie Walters MRN: [id number] Date of Birth: 1924-10-16  Subjective/Objective:  From [facility] facility (indep) presents wit PE /DVT. NCM awaiting benefit check for NOACs eliquis/xarelto.                   Action/Plan: NCM awaiting benefit check for NOACS, will follow for transition of care needs.  Expected Discharge Date:                  Expected Discharge Plan:  [place]  In-House Referral:     Discharge planning Services  CM Consult  Post Acute Care Choice:    Choice offered to:     DME Arranged:    DME Agency:     HH Arranged:    [place] Agency:     Status of Service:  In process, will continue to follow  If discussed at Long Length of Stay Meetings, dates discussed:    Additional Comments:  Zenon Mayo, RN 03/23/2018, 9:22 AM

## 2018-03-23 NOTE — Progress Notes (Signed)
[place] for heparin Indication: pulmonary embolus  Allergies  Allergen Reactions  . Latex Itching  . Lipitor [Atorvastatin Calcium] Other (See Comments)    Myalgia   . Tape Itching    Patient Measurements: Height: 5\' 5"  (165.1 cm) Weight: 150 lb (68 kg) IBW/kg (Calculated) : 57 Heparin Dosing Weight: 68kg  Vital Signs: Temp: 97.6 F (36.4 C) (12/02 0035) Temp Source: Oral (12/02 0035) BP: 126/64 (12/02 0035) Pulse Rate: 66 (12/02 0035)  Labs: Recent Labs    03/22/18 1154 03/22/18 1751 03/23/18 0044  HGB 14.1  --   --   HCT 44.3  --   --   PLT 227  --   --   HEPARINUNFRC  --   --  0.81*  CREATININE 0.67  --   --   TROPONINI  --  0.12*  --     Estimated Creatinine Clearance: 41.2 mL/min (by C-G formula based on SCr of 0.67 mg/dL).   Medical History: Past Medical History:  Diagnosis Date  . Arthritis   . CTS (carpal tunnel syndrome) 06/18/2015  . Depression 4/03  . Diverticulosis   . Diverticulosis of colon without hemorrhage 10/07/2011  . Elevated blood pressure 07/24/2015  . GERD (gastroesophageal reflux disease)   . Granuloma of skin    RLL  . H/O measles   . H/O mumps   . Hearing loss   . History of chicken pox   . Hyperlipidemia   . Hypertension 07/24/2015  . Hypothyroidism   . Incontinence in female 02/06/2015  . Lumbar herniated disc   . Medicare annual wellness visit, subsequent 05/30/2016  . Nocturia 06/18/2015  . Osteopenia   . Tinnitus 07/24/2015  . Vitamin D deficiency     Medications:  Infusions:  . heparin 1,100 Units/hr (03/22/18 1516)  . lactated ringers 75 mL/hr at 03/22/18 2309    Assessment: 58 yof presented with SOB found to have an acute PE with right heart strain. To start IV heparin. She is not on anticoagulation PTA. Baseline CBC is WNL. Initial heparin level is 0.81 units/hr   Goal of Therapy:  Heparin level 0.3-0.7 units/ml Monitor platelets by anticoagulation protocol: Yes    Plan:  Decrease heparin gtt 950 units/hr Check heparin level in ~ 6 hours Daily heparin level and CBC  Kylie Walters 03/23/2018,1:13 AM

## 2018-03-23 NOTE — Care Management (Signed)
#  3.  S/W KATHERINE   @ [facility] # [phone]  RIVAROXABAN : NONE FORMULARY  1. XARELTO 15 MG BID  COVER- YES CO-PAY- $ 293.64 TIER- 3 DRUG PRIOR APPROVAL- NO  2. XARELTO  20 MG DAILY COVER- YES CO-PAY- $ 293.64 TIER- 3 DRUG PRIOR APPROVAL- NO  APIXABAN : NONE FORMULARY  ELIQUIS  10 MG : NONE FORMULARY  3. ELIQUIS  2.5 MG BID COVER- YES CO-PAY - $ 293.64 TIER- 3 DRUG PRIOR APPROVAL- NO   4. ELIQUIS  5 MG BID COVER- YES CO-PAY- $ 293.64 TIER- 3 DRUG PRIOR APPROVAL- NO  DEDUCTIBLE: NOT MET  PREFERRED PHARMACY : YES [facility] AND [facility] M/O 90 DAY SUPPLY AT M/O $ 338.64

## 2018-03-23 NOTE — Clinical Social Work Note (Addendum)
Pt is from [place] ([place] [id number]). Pt's from Retirement home do not require CSW workup. Please consult if different level of care is noted.   [place], [place]

## 2018-03-23 NOTE — Progress Notes (Signed)
  Echocardiogram 2D Echocardiogram has been performed.  Kylie Walters 03/23/2018, 9:36 AM

## 2018-03-23 NOTE — Progress Notes (Signed)
ANTICOAGULATION CONSULT NOTE - Follow Up  Pharmacy Consult for heparin Indication: pulmonary embolus, LLE DVT   Assessment: 84 yo F presented with SOB found to have an acute PE with right heart strain. Dopplers also noted LLE DVT.  She is not on anticoagulation PTA. Baseline CBC is WNL.    Heparin level is now therapeutic on 800 units/hr.  Heparin level = 0.45   Goal of Therapy:  Heparin level 0.3-0.7 units/ml Monitor platelets by anticoagulation protocol: Yes   Plan:  Continue heparin gtt at 800 units/hr Next heparin level with AM labs. Daily heparin level and CBC  Ms. Simm has a Part D Medicare stand alone plan.  She has not yet met her deductible, therefore her copay for DOACs is high.  We can cover her through December with the free card and educate her about different options for Part D plans (high premium, no deductible, etc) for the new year (currently in Open Enrollment and can change plans if needed).  Follow up transition to [facility] 12/3 AM    Allergies  Allergen Reactions  . Latex Itching  . Lipitor [Atorvastatin Calcium] Other (See Comments)    Myalgia   . Tape Itching    Patient Measurements: Height: _0  (165.1 cm) Weight: 150 lb (68 kg) IBW/kg (Calculated) : 57 Heparin Dosing Weight: 68kg  Vital Signs: Temp: 97.5 F (36.4 C) (12/02 0757) Temp Source: Oral (12/02 0757) BP: 162/66 (12/02 0757) Pulse Rate: 63 (12/02 0757)  Labs: Recent Labs    03/22/18 1154 03/22/18 1751 03/23/18 0044 03/23/18 0215 03/23/18 0530 03/23/18 0803 03/23/18 1802  HGB 14.1  --   --   --  12.6  --   --   HCT 44.3  --   --   --  40.8  --   --   PLT 227  --   --   --  244  --   --   HEPARINUNFRC  --   --  0.81*  --   --  0.81* 0.45  CREATININE 0.67  --   --  0.58  --   --   --   TROPONINI  --  0.12* 0.09*  --   --   --   --     Estimated Creatinine Clearance: 41.2 mL/min (by C-G formula based on SCr of 0.58 mg/dL).   Medications:  Infusions:  . heparin 800  Units/hr (03/23/18 1212)     Horton Chin, Pharm.D., BCPS Clinical Pharmacist  **Pharmacist phone directory can now be found on [email or web address] ([email or web address]).  Listed under [place].  03/23/2018 7:16 PM

## 2018-03-23 NOTE — Progress Notes (Signed)
VASCULAR LAB PRELIMINARY  PRELIMINARY  PRELIMINARY  PRELIMINARY  Bilateral lower extremity venous duplex completed.    Preliminary report:  There is acute DVT in the left posterior tibial and peroneal veins.  Guenevere Roorda, RVT 03/23/2018, 3:19 PM

## 2018-03-23 NOTE — Progress Notes (Signed)
[place] for heparin Indication: pulmonary embolus  Allergies  Allergen Reactions  . Latex Itching  . Lipitor [Atorvastatin Calcium] Other (See Comments)    Myalgia   . Tape Itching    Patient Measurements: Height: 5\' 5"  (165.1 cm) Weight: 150 lb (68 kg) IBW/kg (Calculated) : 57 Heparin Dosing Weight: 68kg  Vital Signs: Temp: 97.5 F (36.4 C) (12/02 0757) Temp Source: Oral (12/02 0757) BP: 162/66 (12/02 0757) Pulse Rate: 63 (12/02 0757)  Labs: Recent Labs    03/22/18 1154 03/22/18 1751 03/23/18 0044 03/23/18 0215 03/23/18 0530 03/23/18 0803  HGB 14.1  --   --   --  12.6  --   HCT 44.3  --   --   --  40.8  --   PLT 227  --   --   --  244  --   HEPARINUNFRC  --   --  0.81*  --   --  0.81*  CREATININE 0.67  --   --  0.58  --   --   TROPONINI  --  0.12* 0.09*  --   --   --     Estimated Creatinine Clearance: 41.2 mL/min (by C-G formula based on SCr of 0.58 mg/dL).   Medications:  Infusions:  . heparin 950 Units/hr (03/23/18 0118)  . lactated ringers 75 mL/hr at 03/22/18 2309    Assessment: 81 yof presented with SOB found to have an acute PE with right heart strain. She is not on anticoagulation PTA. Baseline CBC is WNL.  Continues on IV heparin Heparin level = 0.81    Goal of Therapy:  Heparin level 0.3-0.7 units/ml Monitor platelets by anticoagulation protocol: Yes   Plan:  Decrease heparin gtt 800 units/hr Check heparin level in ~ 8 hours Daily heparin level and CBC  Thank you Anette Guarneri, PharmD [phone] 03/23/2018,8:50 AM

## 2018-03-23 NOTE — Progress Notes (Signed)
ANTICOAGULATION CONSULT NOTE   Pharmacy Consult for heparin Indication: pulmonary embolus   Assessment: 37 yof presented with SOB found to have an acute PE with right heart strain. She is not on anticoagulation PTA. Baseline CBC is WNL.  Continues on IV heparin Heparin level = 0.81   Goal of Therapy:  Heparin level 0.3-0.7 units/ml Monitor platelets by anticoagulation protocol: Yes   Plan:  Decrease heparin gtt 800 units/hr Check heparin level in ~ 8 hours Daily heparin level and CBC  Ms. Spellman has a Part D Medicare stand alone plan.  She has not yet met her deductible, therefore her copay for DOACs is high.  We can cover her through December with the free card and educate her about different options for Part D plans (high premium, no deductible, etc) for the new year (currently in Open Enrollment and can change plans if needed)  Follow up transition to [facility] 12/3 AM    Allergies  Allergen Reactions  . Latex Itching  . Lipitor [Atorvastatin Calcium] Other (See Comments)    Myalgia   . Tape Itching    Patient Measurements: Height: '5\' 5"'  (165.1 cm) Weight: 150 lb (68 kg) IBW/kg (Calculated) : 57 Heparin Dosing Weight: 68kg  Vital Signs: Temp: 97.5 F (36.4 C) (12/02 0757) Temp Source: Oral (12/02 0757) BP: 162/66 (12/02 0757) Pulse Rate: 63 (12/02 0757)  Labs: Recent Labs    03/22/18 1154 03/22/18 1751 03/23/18 0044 03/23/18 0215 03/23/18 0530 03/23/18 0803  HGB 14.1  --   --   --  12.6  --   HCT 44.3  --   --   --  40.8  --   PLT 227  --   --   --  244  --   HEPARINUNFRC  --   --  0.81*  --   --  0.81*  CREATININE 0.67  --   --  0.58  --   --   TROPONINI  --  0.12* 0.09*  --   --   --     Estimated Creatinine Clearance: 41.2 mL/min (by C-G formula based on SCr of 0.58 mg/dL).   Medications:  Infusions:  . heparin 800 Units/hr (03/23/18 1212)     Thank you Anette Guarneri, PharmD [phone] 03/23/2018,2:50 PM

## 2018-03-24 LAB — CBC
HCT: 42.9 % (ref 36.0–46.0)
Hemoglobin: 13.1 g/dL (ref 12.0–15.0)
MCH: 28.5 pg (ref 26.0–34.0)
MCHC: 30.5 g/dL (ref 30.0–36.0)
MCV: 93.3 fL (ref 80.0–100.0)
Platelets: 235 10*3/uL (ref 150–400)
RBC: 4.6 MIL/uL (ref 3.87–5.11)
RDW: 13.4 % (ref 11.5–15.5)
WBC: 7.2 10*3/uL (ref 4.0–10.5)
nRBC: 0 % (ref 0.0–0.2)

## 2018-03-24 LAB — BASIC METABOLIC PANEL
ANION GAP: 8 (ref 5–15)
BUN: 13 mg/dL (ref 8–23)
CO2: 25 mmol/L (ref 22–32)
Calcium: 9.6 mg/dL (ref 8.9–10.3)
Chloride: 103 mmol/L (ref 98–111)
Creatinine, Ser: 0.8 mg/dL (ref 0.44–1.00)
GFR calc Af Amer: >60 mL/min (ref >60)
GFR calc non Af Amer: >60 mL/min (ref >60)
Glucose, Bld: 200 mg/dL — ABNORMAL HIGH (ref 70–99)
Potassium: 3.9 mmol/L (ref 3.5–5.1)
Sodium: 136 mmol/L (ref 135–145)

## 2018-03-24 LAB — HEPARIN LEVEL (UNFRACTIONATED): HEPARIN UNFRACTIONATED: 0.36 [IU]/mL (ref 0.30–0.70)

## 2018-03-24 LAB — MAGNESIUM: Magnesium: 2 mg/dL (ref 1.7–2.4)

## 2018-03-24 MED ORDER — POTASSIUM CHLORIDE CRYS ER 20 MEQ PO TBCR
40.0000 meq | EXTENDED_RELEASE_TABLET | Freq: Once | ORAL | Status: AC
  Administered 2018-03-24: 40 meq via ORAL
  Filled 2018-03-24: qty 2

## 2018-03-24 NOTE — Clinical Social Work Note (Signed)
CSW received a call from [place], with admissions at [facility]. Pt has contacted facility to make sure she could go there for rehab. Whitney explained pt asked if she could come tomorrow if d/c. Whitney states pt will have a bed when she is d/c and CSW to update her tomorrow or on day of d/c.  Loletha Grayer, [place]

## 2018-03-24 NOTE — Evaluation (Signed)
Physical Therapy Evaluation Patient Details Name: CLETIS MUMA MRN: [id number] DOB: 09-12-1924 Today's Date: 03/24/2018   History of Present Illness  Shiasia Porro  is a 84 y.o. female, with history of hypertension, hypothyroidism, dyslipidemia, bilateral hearing loss who lives at a retirement community and was feeling well until about few days ago when she started getting short of breath, she noticed that her shortness of breath was worse on exertion and somewhat worse on laying flat, no other associated symptoms, no other aggravating or relieving factors, it gradually got worse to the point that she came to the ER.  In the ER work-up suggested large PE with some right heart strain on CT scan. Also with left DVT.   Clinical Impression  Pt admitted with above diagnosis. Pt currently with functional limitations due to the deficits listed below (see PT Problem List). Pt was able to ambulate with RW in hallway.  Pt with slight decr safety and very fatigued at end of walk.  Will benefit from Rehab at SNF at [facility].  Pt will benefit from skilled PT to increase their independence and safety with mobility to allow discharge to the venue listed below.      Follow Up Recommendations SNF;Supervision/Assistance - 24 hour(Recommend short rehab stay at [facility] to apt)    Equipment Recommendations  Other (comment)(rollator)    Recommendations for Other Services       Precautions / Restrictions Precautions Precautions: Fall Restrictions Weight Bearing Restrictions: No      Mobility  Bed Mobility Overal bed mobility: Independent                Transfers Overall transfer level: Needs assistance Equipment used: Rolling walker (2 wheeled) Transfers: Sit to/from Stand Sit to Stand: Supervision;Min guard         General transfer comment: cues for hand placement  Ambulation/Gait Ambulation/Gait assistance: Min guard;Min assist Gait Distance (Feet): 150 Feet Assistive  device: Rolling walker (2 wheeled) Gait Pattern/deviations: Step-through pattern;Decreased stride length;Trunk flexed;Drifts right/left   Gait velocity interpretation: 1.31 - 2.62 ft/sec, indicative of limited community ambulator General Gait Details: Needed  a little help to steer RW especially around obstacles.  Pt with slightly flexed trunk which worsened as she fatigued.  Sats on RA 90-94% with ambulation with DOE at end of walk 3/4 which took a pt a few minutes to recover once sitting.    Stairs            Wheelchair Mobility    Modified Rankin (Stroke Patients Only)       Balance Overall balance assessment: Needs assistance Sitting-balance support: No upper extremity supported;Feet supported Sitting balance-Leahy Scale: Fair     Standing balance support: Bilateral upper extremity supported;During functional activity Standing balance-Leahy Scale: Poor Standing balance comment: Pt somewhat reliant on bil UE support for balance                              Pertinent Vitals/Pain Pain Assessment: No/denies pain    Home Living Family/patient expects to be discharged to:: Private residence Living Arrangements: Alone   Type of Home: Independent living facility Home Access: Level entry     Home Layout: One level Home Equipment: Grab bars - tub/shower;Shower seat;Cane - single point;Walker - 2 wheels Additional Comments: FAcility cleans and pt gets one meal a day.  [place]    Prior Function Level of Independence: Independent         Comments: used  devices when pt felt she needed them     Hand Dominance   Dominant Hand: Right    Extremity/Trunk Assessment   Upper Extremity Assessment Upper Extremity Assessment: Defer to OT evaluation    Lower Extremity Assessment Lower Extremity Assessment: Generalized weakness    Cervical / Trunk Assessment Cervical / Trunk Assessment: Normal  Communication   Communication: No difficulties  Cognition  Arousal/Alertness: Awake/alert Behavior During Therapy: WFL for tasks assessed/performed Overall Cognitive Status: Within Functional Limits for tasks assessed                                        General Comments      Exercises     Assessment/Plan    PT Assessment Patient needs continued PT services  PT Problem List Decreased strength;Decreased activity tolerance;Decreased balance;Decreased mobility;Decreased knowledge of use of DME;Decreased safety awareness;Decreased knowledge of precautions;Cardiopulmonary status limiting activity       PT Treatment Interventions Gait training;DME instruction;Functional mobility training;Therapeutic activities;Therapeutic exercise;Balance training;Patient/family education    PT Goals (Current goals can be found in the Care Plan section)  Acute Rehab PT Goals Patient Stated Goal: to go home PT Goal Formulation: With patient Time For Goal Achievement: 04/07/18 Potential to Achieve Goals: Good    Frequency Min 3X/week   Barriers to discharge Decreased caregiver support      Co-evaluation               AM-PAC PT "6 Clicks" Mobility  Outcome Measure Help needed turning from your back to your side while in a flat bed without using bedrails?: None Help needed moving from lying on your back to sitting on the side of a flat bed without using bedrails?: None Help needed moving to and from a bed to a chair (including a wheelchair)?: A Little Help needed standing up from a chair using your arms (e.g., wheelchair or bedside chair)?: A Little Help needed to walk in hospital room?: A Little Help needed climbing 3-5 steps with a railing? : A Little 6 Click Score: 20    End of Session Equipment Utilized During Treatment: Gait belt Activity Tolerance: Patient limited by fatigue Patient left: in chair;with call bell/phone within reach Nurse Communication: Mobility status PT Visit Diagnosis: Muscle weakness (generalized)  (M62.81)    Time: 5809-9833 PT Time Calculation (min) (ACUTE ONLY): 22 min   Charges:   PT Evaluation $PT Eval Moderate Complexity: 1 Mod          Sharlee Rufino,PT Acute Rehabilitation Services Pager:  [phone]  Office:  [place] 03/24/2018, 12:17 PM

## 2018-03-24 NOTE — Progress Notes (Signed)
[facility]  PROGRESS NOTE  Kylie Walters EYC:[id number] DOB: April 25, 1924 DOA: 03/22/2018 PCP: Mosie Lukes, MD   Brief HPI:   84 year old female with history of hypertension, hypothyroidism, dyslipidemia, bilateral hearing loss who resides at a retirement community came with shortness of breath on exertion.  CT chest showed large PE, venous duplex showed DVT in left lower extremity.    Subjective   Patient seen and examined, denies shortness of breath.  No chest pain.   Assessment/Plan:     1. Submassive PV-with CT evidence of right heart strain and moderate systolic dysfunction on echocardiogram.  Patient is requiring oxygen 2 L/min.  Troponin is down to 0.09, oxygen is being weaned off as tolerated.  Currently on IV heparin.  We will continue heparin for another 24 hours, consider switching to Eliquis in a.m.  2. DVT left posterior tibial/peroneal veins-on IV heparin as above.    3. Mediastinal adenopathy-incidental finding on CT chest, indeterminate etiology.  Differential include lymphoproliferative disorder, metastatic disease, granulomatous inflammation or infection.  Follow-up imaging in 3 months recommended.  4. Aortic atherosclerosis-no treatment indicated at this time.      CBG: No results for input(s): GLUCAP in the last 168 hours.  CBC: Recent Labs  Lab 03/22/18 1154 03/23/18 0530 03/24/18 0214  WBC 7.4 7.4 7.2  NEUTROABS 4.3  --   --   HGB 14.1 12.6 13.1  HCT 44.3 40.8 42.9  MCV 91.3 91.9 93.3  PLT 227 244 149    Basic Metabolic Panel: Recent Labs  Lab 03/22/18 1154 03/23/18 0215  NA 139 139  K 4.0 3.4*  CL 108 109  CO2 23 25  GLUCOSE 112* 106*  BUN 11 7*  CREATININE 0.67 0.58  CALCIUM 9.4 8.9  MG  --  2.1     DVT prophylaxis: IV heparin  Code Status: DNR  Family Communication: No family at bedside  Disposition Plan: Will obtain PT evaluation today   Consultants:    Procedures:  Echocardiogram   Antibiotics:    Anti-infectives (From admission, onward)   None       Objective   Vitals:   03/23/18 0035 03/23/18 0757 03/24/18 0009 03/24/18 0812  BP: 126/64 (!) 162/66 (!) 143/81 (!) 150/64  Pulse: 66 63 72 64  Resp:  18 18 18   Temp: 97.6 F (36.4 C) (!) 97.5 F (36.4 C) (!) 97.5 F (36.4 C) 98.7 F (37.1 C)  TempSrc: Oral Oral Oral   SpO2: 96% 98% 96% 94%  Weight:      Height:        Intake/Output Summary (Last 24 hours) at 03/24/2018 1319 Last data filed at 03/24/2018 1029 Gross per 24 hour  Intake 159.65 ml  Output 11500 ml  Net -11340.35 ml   Filed Weights   03/22/18 1147  Weight: 68 kg     Physical Examination:    General: Appears in no acute distress  Cardiovascular: S1-S2, regular, no murmurs auscultated  Respiratory: Clear to auscultation bilaterally  Abdomen: Soft, nontender, no organomegaly  Extremities: No edema in the lower extremities  Neurologic: Alert, oriented x3, no focal deficit noted     Data Reviewed: I have personally reviewed following labs and imaging studies    Cardiac Enzymes: Recent Labs  Lab 03/22/18 1751 03/23/18 0044  TROPONINI 0.12* 0.09*   BNP (last 3 results) Recent Labs    03/22/18 1154  BNP 403.2*    ProBNP (last 3 results) No results for input(s): PROBNP in the last  8760 hours.    Studies: Ct Angio Chest Pe W And/or Wo Contrast  Result Date: 03/22/2018 CLINICAL DATA:  Short of breath for several days.  Elevated D-dimer. EXAM: CT ANGIOGRAPHY CHEST WITH CONTRAST TECHNIQUE: Multidetector CT imaging of the chest was performed using the standard protocol during bolus administration of intravenous contrast. Multiplanar CT image reconstructions and MIPs were obtained to evaluate the vascular anatomy. CONTRAST:  30mL ISOVUE-370 IOPAMIDOL (ISOVUE-370) INJECTION 76% COMPARISON:  CT chest 05/11/2014 FINDINGS: Cardiovascular: Normal heart size. Aortic atherosclerosis. Calcifications in the LAD, left circumflex and RCA  coronary arteries noted. Large bilateral central obstructing pulmonary emboli are identified. Pulmonary emboli are identified within the right upper lobe, right middle lobe and right lower lobe lobar and segmental pulmonary arteries. Similarly there are upper and lower and lingular lobar pulmonary artery filling defects on the left. The RV to LV ratio is equal to 5.3/3.8 = 1.39 consistent with right heart strain. Reflux of contrast material into the IVC and hepatic veins reflecting increased right heart pressures. Mediastinum/Nodes: Mediastinal adenopathy is identified, new from previous exam. Left pre-vascular lymph node measures 1.5 cm, image 53/5. Sub-carinal lymph node measures 1.9 cm, image 69/5. Right paratracheal lymph node measures 1.7 cm, image 48/5. Prominent right supraclavicular lymph nodes identified measuring up to 1.1 cm. No axillary adenopathy. Lungs/Pleura: Calcified granuloma identified in the right lower lobe. Subpleural nodular consolidation within the lateral right upper lobe is identified, image 56/6. Stable solid round nodule in the right middle lobe measuring 5 mm, image 85/6. Upper Abdomen: No acute abnormality. Musculoskeletal: No chest wall abnormality. No acute or significant osseous findings. Review of the MIP images confirms the above findings. IMPRESSION: 1. Positive for acute PE with CT evidence of right heart strain (RV/LV Ratio = 1.39) consistent with at least submassive (intermediate risk) PE. The presence of right heart strain has been associated with an increased risk of morbidity and mortality. Please activate Code PE by paging [phone]. 2. New mediastinal adenopathy identified compared with 05/11/2014. Etiology indeterminate. Findings may reflect lymphoproliferative disorder, metastatic disease, granulomatous inflammation or infection. Followup imaging in 3 months is recommended. This recommendation follows ACR consensus guidelines: Managing Incidental Findings on Thoracic  CT: Mediastinal and Cardiovascular Findings. A White Paper of the ACR Incidental Findings Committee. J Am Coll Radiol. 2018; 15: 3329-5188. 3. Subpleural nodular densities within the right upper lobe are identified. These may represent areas of subpleural infarct secondary to pulmonary emboli. Attention on follow-up imaging is recommended. 4.  Aortic Atherosclerosis (ICD10-I70.0). 5. Coronary artery atherosclerotic calcifications. Critical Value/emergent results were called by telephone at the time of interpretation on 03/22/2018 at 2:44 pm to Dr. Orlie Dakin , who verbally acknowledged these results. Electronically Signed   By: Kerby Moors M.D.   On: 03/22/2018 14:44   Vas [place] Lower Extremity Venous (dvt)  Result Date: 03/24/2018  Lower Venous Study Indications: SOB.  Risk Factors: Confirmed PE. Performing Technologist: Sharion Dove RVS  Examination Guidelines: A complete evaluation includes B-mode imaging, spectral Doppler, color Doppler, and power Doppler as needed of all accessible portions of each vessel. Bilateral testing is considered an integral part of a complete examination. Limited examinations for reoccurring indications may be performed as noted.  Right Venous Findings: +---------+---------------+---------+-----------+----------+-------+          CompressibilityPhasicitySpontaneityPropertiesSummary +---------+---------------+---------+-----------+----------+-------+ CFV      Full           Yes      Yes                          +---------+---------------+---------+-----------+----------+-------+  SFJ      Full                                                 +---------+---------------+---------+-----------+----------+-------+ FV Prox  Full                                                 +---------+---------------+---------+-----------+----------+-------+ FV Mid   Full                                                  +---------+---------------+---------+-----------+----------+-------+ FV DistalFull                                                 +---------+---------------+---------+-----------+----------+-------+ PFV      Full                                                 +---------+---------------+---------+-----------+----------+-------+ POP      Full           Yes      Yes                          +---------+---------------+---------+-----------+----------+-------+ PTV      Full                                                 +---------+---------------+---------+-----------+----------+-------+ PERO     Full                                                 +---------+---------------+---------+-----------+----------+-------+  Left Venous Findings: +---------+---------------+---------+-----------+----------+-------+          CompressibilityPhasicitySpontaneityPropertiesSummary +---------+---------------+---------+-----------+----------+-------+ CFV      Full           Yes      Yes                          +---------+---------------+---------+-----------+----------+-------+ SFJ      Full                                                 +---------+---------------+---------+-----------+----------+-------+ FV Prox  Full                                                 +---------+---------------+---------+-----------+----------+-------+  FV Mid   Full                                                 +---------+---------------+---------+-----------+----------+-------+ FV DistalFull                                                 +---------+---------------+---------+-----------+----------+-------+ PFV      Full                                                 +---------+---------------+---------+-----------+----------+-------+ POP      Full           Yes      Yes                           +---------+---------------+---------+-----------+----------+-------+ PTV      None                                         Acute   +---------+---------------+---------+-----------+----------+-------+ PERO     None                                         Acute   +---------+---------------+---------+-----------+----------+-------+    Summary: Right: There is no evidence of deep vein thrombosis in the lower extremity. Left: Findings consistent with acute deep vein thrombosis involving the left posterior tibial vein, and left peroneal vein.  *See table(s) above for measurements and observations.    Preliminary     Scheduled Meds: . amLODipine  10 mg Oral Daily  . levothyroxine  88 mcg Oral QAC breakfast  . psyllium  1 packet Oral Daily    Admission status: Inpatient: Based on patients clinical presentation and evaluation of above clinical data, I have made determination that patient meets Inpatient criteria at this time.  Patient is on IV heparin for DVT/PE  Time spent: 25 min  [facility] Pager [phone]. If 7PM-7AM, please contact night-coverage at [email or web address], Office  [phone]  password [email or web address]  03/24/2018, 1:19 PM  LOS: 2 days

## 2018-03-24 NOTE — Progress Notes (Signed)
ANTICOAGULATION CONSULT NOTE - Follow Up  Pharmacy Consult for heparin Indication: pulmonary embolus, LLE DVT   Assessment: 84 yo F presented with SOB found to have an acute PE with right heart strain. Dopplers also noted LLE DVT.  She is not on anticoagulation PTA. Baseline CBC is WNL.    Heparin level is now therapeutic on 800 units/hr.  Heparin level = 0.36   Goal of Therapy:  Heparin level 0.3-0.7 units/ml Monitor platelets by anticoagulation protocol: Yes   Plan:  Continue heparin gtt at 800 units/hr Daily heparin level and CBC   Allergies  Allergen Reactions  . Latex Itching  . Lipitor [Atorvastatin Calcium] Other (See Comments)    Myalgia   . Tape Itching    Patient Measurements: Height: 5\' 5"  (165.1 cm) Weight: 150 lb (68 kg) IBW/kg (Calculated) : 57 Heparin Dosing Weight: 68kg  Vital Signs: Temp: 98.7 F (37.1 C) (12/03 0812) Temp Source: Oral (12/03 0009) BP: 150/64 (12/03 0812) Pulse Rate: 64 (12/03 0812)  Labs: Recent Labs    03/22/18 1154 03/22/18 1751  03/23/18 0044 03/23/18 0215 03/23/18 0530 03/23/18 0803 03/23/18 1802 03/24/18 0214  HGB 14.1  --   --   --   --  12.6  --   --  13.1  HCT 44.3  --   --   --   --  40.8  --   --  42.9  PLT 227  --   --   --   --  244  --   --  235  HEPARINUNFRC  --   --    < > 0.81*  --   --  0.81* 0.45 0.36  CREATININE 0.67  --   --   --  0.58  --   --   --   --   TROPONINI  --  0.12*  --  0.09*  --   --   --   --   --    < > = values in this interval not displayed.    Estimated Creatinine Clearance: 41.2 mL/min (by C-G formula based on SCr of 0.58 mg/dL).   Medications:  Infusions:  . heparin 800 Units/hr (03/23/18 1212)    Thank you Anette Guarneri, PharmD [phone]  03/24/2018 8:37 AM

## 2018-03-24 NOTE — NC FL2 (Signed)
[place] LEVEL OF CARE SCREENING TOOL     IDENTIFICATION  Patient Name: Kylie Walters Birthdate: 08/26/1924 Sex: female Admission Date (Current Location): 03/22/2018  [facility] and [place] Number:  Herbalist and Address:  [facility], [place] [address], [place], [place] [address]      Provider Number: [id number]  Attending Physician Name and Address:  Oswald Hillock, MD  Relative Name and Phone Number:       Current Level of Care: Hospital Recommended Level of Care: [place] Prior Approval Number:    Date Approved/Denied:   PASRR Number: [id number]  Discharge Plan: SNF    Current Diagnoses: Patient Active Problem List   Diagnosis Date Noted  . Acute pulmonary embolism ([place]) 03/23/2018  . Leg DVT (deep venous thromboembolism), acute, left ([place]) 03/23/2018  . Mediastinal adenopathy 03/23/2018  . Aortic atherosclerosis ([place]) 03/23/2018  . Essential hypertension 03/22/2018  . Anxiety 07/13/2017  . Bradycardia 12/01/2016  . Medicare annual wellness visit, subsequent 05/30/2016  . Recurrent falls 03/24/2016  . Tinnitus 07/24/2015  . CTS (carpal tunnel syndrome) 06/18/2015  . Nocturia 06/18/2015  . Dyspnea 06/14/2015  . Incontinence in female 02/06/2015  . History of chicken pox   . Hyperlipidemia, mixed 05/24/2014  . Osteoarthritis 05/29/2013  . 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 Tobacco use disorder  Quit smoking 35 years ago  11/16/2012  . Calcified granuloma of lung ([place]) 11/16/2012  . Sinus bradycardia on ECG 11/16/2012  . TMJ arthralgia 03/10/2012  . Hypothyroidism 10/07/2011  . Osteopenia  Dexa done 05/27/2014 normal  10/07/2011  . Diverticulosis of colon without hemorrhage 10/07/2011  . Vitamin D deficiency 10/07/2011  . GERD (gastroesophageal reflux disease) 10/07/2011  . Hearing loss 10/07/2011  . DJD (degenerative joint disease) 10/07/2011  . History of depression 10/07/2011  . Disc  herniation 10/07/2011  . H/O hysterectomy with oophorectomy 10/07/2011  . Family history of breast cancer in first degree relative 10/07/2011  . Thyroid cyst 10/07/2011    Orientation RESPIRATION BLADDER Height & Weight     Self, Time, Situation, Place  O2(Nasal Cannula 2L) External catheter, Incontinent(placed 03/22/18) Weight: 150 lb (68 kg) Height:  5\' 5"  (165.1 cm)  BEHAVIORAL SYMPTOMS/MOOD NEUROLOGICAL BOWEL NUTRITION STATUS      Incontinent Diet(regular diet thin liquids)  AMBULATORY STATUS COMMUNICATION OF NEEDS Skin   Limited Assist Verbally Normal                       Personal Care Assistance Level of Assistance  Dressing, Feeding, Bathing Bathing Assistance: Limited assistance Feeding assistance: Independent Dressing Assistance: Limited assistance     Functional Limitations Info  Sight, Hearing, Speech Sight Info: Adequate Hearing Info: Adequate Speech Info: Adequate    SPECIAL CARE FACTORS FREQUENCY  OT (By licensed OT), PT (By licensed PT)     PT Frequency: 3x OT Frequency: 3x            Contractures Contractures Info: Not present    Additional Factors Info  Code Status, Allergies Code Status Info: DNR Allergies Info: Latex, Lipitor Atorvastatin Calcium, Tape           Current Medications (03/24/2018):  This is the current hospital active medication list Current Facility-Administered Medications  Medication Dose Route Frequency Provider Last Rate Last Dose  . acetaminophen (TYLENOL) tablet 650 mg  650 mg Oral Q6H PRN Thurnell Lose, MD   650 mg at 03/24/18 0602  Or  . acetaminophen (TYLENOL) suppository 650 mg  650 mg Rectal Q6H PRN Thurnell Lose, MD      . amLODipine (NORVASC) tablet 10 mg  10 mg Oral Daily Lala Lund K, MD   10 mg at 03/24/18 1338  . heparin ADULT infusion 100 units/mL (25000 units/215mL sodium chloride 0.45%)  800 Units/hr Intravenous Continuous Samuella Cota, MD 8 mL/hr at 03/24/18 0800 800 Units/hr at  03/24/18 0800  . levothyroxine (SYNTHROID, LEVOTHROID) tablet 88 mcg  88 mcg Oral QAC breakfast Thurnell Lose, MD   88 mcg at 03/24/18 0602  . polyethylene glycol (MIRALAX / GLYCOLAX) packet 17 g  17 g Oral Daily PRN Thurnell Lose, MD      . psyllium (HYDROCIL/METAMUCIL) packet 1 packet  1 packet Oral Daily Thurnell Lose, MD   1 packet at 03/24/18 1339  . senna (SENOKOT) tablet 17.2 mg  2 tablet Oral Daily PRN Thurnell Lose, MD         Discharge Medications: Please see discharge summary for a list of discharge medications.  Relevant Imaging Results:  Relevant Lab Results:   Additional Information SSN: [id number]  Eileen Stanford, LCSW

## 2018-03-25 DIAGNOSIS — R590 Localized enlarged lymph nodes: Secondary | ICD-10-CM | POA: Diagnosis not present

## 2018-03-25 DIAGNOSIS — I82442 Acute embolism and thrombosis of left tibial vein: Secondary | ICD-10-CM | POA: Diagnosis not present

## 2018-03-25 DIAGNOSIS — I519 Heart disease, unspecified: Secondary | ICD-10-CM | POA: Diagnosis not present

## 2018-03-25 DIAGNOSIS — M1990 Unspecified osteoarthritis, unspecified site: Secondary | ICD-10-CM | POA: Diagnosis not present

## 2018-03-25 DIAGNOSIS — Z7401 Bed confinement status: Secondary | ICD-10-CM | POA: Diagnosis not present

## 2018-03-25 DIAGNOSIS — I10 Essential (primary) hypertension: Secondary | ICD-10-CM | POA: Diagnosis not present

## 2018-03-25 DIAGNOSIS — J984 Other disorders of lung: Secondary | ICD-10-CM | POA: Diagnosis not present

## 2018-03-25 DIAGNOSIS — I2699 Other pulmonary embolism without acute cor pulmonale: Secondary | ICD-10-CM | POA: Diagnosis not present

## 2018-03-25 DIAGNOSIS — F17201 Nicotine dependence, unspecified, in remission: Secondary | ICD-10-CM | POA: Diagnosis not present

## 2018-03-25 DIAGNOSIS — M6281 Muscle weakness (generalized): Secondary | ICD-10-CM | POA: Diagnosis not present

## 2018-03-25 DIAGNOSIS — E559 Vitamin D deficiency, unspecified: Secondary | ICD-10-CM | POA: Diagnosis not present

## 2018-03-25 DIAGNOSIS — I82452 Acute embolism and thrombosis of left peroneal vein: Secondary | ICD-10-CM | POA: Diagnosis not present

## 2018-03-25 DIAGNOSIS — K219 Gastro-esophageal reflux disease without esophagitis: Secondary | ICD-10-CM | POA: Diagnosis not present

## 2018-03-25 DIAGNOSIS — I2693 Single subsegmental pulmonary embolism without acute cor pulmonale: Secondary | ICD-10-CM | POA: Diagnosis not present

## 2018-03-25 DIAGNOSIS — E782 Mixed hyperlipidemia: Secondary | ICD-10-CM | POA: Diagnosis not present

## 2018-03-25 DIAGNOSIS — E039 Hypothyroidism, unspecified: Secondary | ICD-10-CM | POA: Diagnosis not present

## 2018-03-25 DIAGNOSIS — M2550 Pain in unspecified joint: Secondary | ICD-10-CM | POA: Diagnosis not present

## 2018-03-25 DIAGNOSIS — I2602 Saddle embolus of pulmonary artery with acute cor pulmonale: Secondary | ICD-10-CM | POA: Diagnosis not present

## 2018-03-25 LAB — CBC
HCT: 45.2 % (ref 36.0–46.0)
Hemoglobin: 14.1 g/dL (ref 12.0–15.0)
MCH: 29 pg (ref 26.0–34.0)
MCHC: 31.2 g/dL (ref 30.0–36.0)
MCV: 92.8 fL (ref 80.0–100.0)
Platelets: 273 10*3/uL (ref 150–400)
RBC: 4.87 MIL/uL (ref 3.87–5.11)
RDW: 13.5 % (ref 11.5–15.5)
WBC: 10.5 10*3/uL (ref 4.0–10.5)
nRBC: 0 % (ref 0.0–0.2)

## 2018-03-25 LAB — HEPARIN LEVEL (UNFRACTIONATED): HEPARIN UNFRACTIONATED: 0.24 [IU]/mL — AB (ref 0.30–0.70)

## 2018-03-25 MED ORDER — AMLODIPINE BESYLATE 5 MG PO TABS: 5.0000 mg | ORAL_TABLET | Freq: Every day | ORAL | 0 refills | Status: DC

## 2018-03-25 MED ORDER — ELIQUIS 5 MG VTE STARTER PACK: ORAL_TABLET | ORAL | 0 refills | Status: DC

## 2018-03-25 MED ORDER — APIXABAN 5 MG PO TABS
10.0000 mg | ORAL_TABLET | Freq: Two times a day (BID) | ORAL | Status: DC
  Filled 2018-03-25: qty 2

## 2018-03-25 MED ORDER — POLYETHYLENE GLYCOL 3350 17 G PO PACK: 17.0000 g | PACK | Freq: Every day | ORAL | 0 refills | Status: AC | PRN

## 2018-03-25 MED ORDER — APIXABAN 5 MG PO TABS
10.0000 mg | ORAL_TABLET | Freq: Two times a day (BID) | ORAL | Status: DC
  Administered 2018-03-25: 10 mg via ORAL

## 2018-03-25 NOTE — Consult Note (Signed)
[facility] CM Primary Care Navigator  03/25/2018  Kylie Walters Aug 13, 1924 [id number]   Met withpatient at the bedside to identify possible discharge needs. Patient states having "shortness of breath and chest pain" that had led to thisadmission and was found to have "blood clots". (acute pulmonary embolism, left leg DVT- deep venous thromboembolism, mediastinal adenopathy, aortic atherosclerosis)  Patient reports that she has been a[facility] of [facility] for more than [facility] years.  PatientendorsesDr. Penni Homans with [place] [facility] as Chief Operating Officer.   [place] in [place] to obtain medications without difficulty.  She mentioned being able to manage her own medications at home using "pillbox" system filled weekly.  Patient mentioned using facility transport which has been providing transportation to herdoctors'appointments.  Patient has been independent with self care, and states being the caregiver for herself, but her daughter Baker Janus) or facility staff provides assistance with care if needed.  Anticipated discharge planisSNF- skilled nursing facility per therapy recommendation.She states thatplan [facility] before returning back to independent living after completion of rehab.   Patient expressedunderstanding to call primary care provider's office when she returns to the facility for a post discharge follow-up visitwithin1- 2 weeksor sooner if needs arise.Patient letter (with PCP's contact number) was provided asareminder.  Explained to patient about [facility] CM services available for health management and resources at [facility] indicated having no current needs or issues at this time.  Patientvoicedunderstanding to [facility]ref[facility] from primary care provider if  deemed necessary and appropriatefor any servicesin thefuture.   [facility] care management information provided for future needs that she may have.  Primary care provider's office is listed as providing transition (TOC) follow-up.   For additional questions please contact:  Edwena Felty A. Kafi Dotter, BSN, RN-BC [facility] PRIMARY CARE Navigator Cell: [phone]

## 2018-03-25 NOTE — Progress Notes (Signed)
ANTICOAGULATION CONSULT NOTE - Follow Up Consult  Pharmacy Consult for heparin Indication: pulmonary embolus  Labs: Recent Labs    03/22/18 1154 03/22/18 1751 03/23/18 0044 03/23/18 0215 03/23/18 0530  03/23/18 1802 03/24/18 0214 03/24/18 1852 03/25/18 0242  HGB 14.1  --   --   --  12.6  --   --  13.1  --  14.1  HCT 44.3  --   --   --  40.8  --   --  42.9  --  45.2  PLT 227  --   --   --  244  --   --  235  --  273  HEPARINUNFRC  --   --  0.81*  --   --    < > 0.45 0.36  --  0.24*  CREATININE 0.67  --   --  0.58  --   --   --   --  0.80  --   TROPONINI  --  0.12* 0.09*  --   --   --   --   --   --   --    < > = values in this interval not displayed.    Assessment: 84yo female now subtherapeutic on heparin after two levels at goal though had been trending down; no gtt issues or signs of bleeding per RN.  Goal of Therapy:  Heparin level 0.3-0.7 units/ml   Plan:  Will increase heparin gtt by 1-2 units/kg/hr to 900 units/hr and check level in 8 hours.    Wynona Neat, PharmD, BCPS  03/25/2018,4:13 AM

## 2018-03-25 NOTE — Clinical Social Work Note (Signed)
Clinical Social Worker facilitated patient discharge including contacting patient family and facility to confirm patient discharge plans.  Clinical information faxed to facility and family agreeable with plan.  CSW arranged ambulance transport via [facility] to [facility] (room [phone]).  RN to call [phone] for report prior to discharge.  Clinical Social Worker will sign off for now as social work intervention is no longer needed. Please consult [place] again if new need arises.  [place], [place]

## 2018-03-25 NOTE — Discharge Summary (Signed)
[facility] Discharge Summary   Patient: Kylie Walters XBD:[id number]   PCP: Mosie Lukes, MD DOB: 1924/05/03   Date of admission: 03/22/2018   Date of discharge:  03/25/2018    Discharge Diagnoses:   Principal Problem:   Acute pulmonary embolism ([place]) Active Problems:   Tobacco use disorder  Quit smoking 35 years ago    Hyperlipidemia, mixed   Leg DVT (deep venous thromboembolism), acute, left (HCC)   Mediastinal adenopathy   Aortic atherosclerosis ([place])   Admitted From: home Disposition:  SNF  Recommendations for Outpatient Follow-up:  1. Please follow-up with PCP in 1 week  Follow-up Information    Mosie Lukes, MD. Schedule an appointment as soon as possible for a visit in 1 week(s).   Specialty:  Family Medicine Contact information: [address] [place] [address] [phone]          Diet recommendation: Cardiac diet  Activity: The patient is advised to gradually reintroduce usual activities.  Discharge Condition: good  Code Status: DNR/DNI  History of present illness: As per the H and P dictated on admission, " Kylie Walters  is a 84 y.o. female, with history of hypertension, hypothyroidism, dyslipidemia, bilateral hearing loss who lives at a retirement community and was feeling well until about few days ago when she started getting short of breath, she noticed that her shortness of breath was worse on exertion and somewhat worse on laying flat, no other associated symptoms, no other aggravating or relieving factors, it gradually got worse to the point that she came to the ER.  In the ER work-up suggested large PE with some right heart strain on CT scan.  I was requested to admit the patient.  Patient denies any other subjective complaints, no previous personal or family history of blood clots, no recent travels or long drives, no history of bleeding or cancers.  No other complaints whatsoever."  Hospital Course:  Summary of her  active problems in the hospital is as following. Principal Problem:   Acute pulmonary embolism (HCC)   Leg DVT (deep venous thromboembolism), acute, left (HCC) CT evidence of right heart strain and moderate systolic dysfunction on echocardiogram.   Patient is requiring oxygen 2 L/min initially now on room air. Initially on IV heparin. Switching to oral anticoagulation.  Preferring Eliquis for now.  Hypothyroidism. Continue Synthroid.    Mediastinal adenopathy incidental finding on CT chest, indeterminate etiology.  Differential include lymphoproliferative disorder, metastatic disease, granulomatous inflammation or infection.  Follow-up imaging in 3 months recommended.    Aortic atherosclerosis ([place]) Patient is on a 81 mg aspirin.  No recent CVA noticing CAD.  To minimize the risk for the bleeding I would discontinue Eliquis for now.  All other chronic medical condition were stable during the hospitalization.  Patient was ambulatory without any assistance. On the day of the discharge the patient's vitals were stable , and no other acute medical condition were reported by patient. the patient was felt safe to be discharge at SNF with therapy.  Consultants: none Procedures: Echocardiogram   DISCHARGE MEDICATION: Allergies as of 03/25/2018      Reactions   Latex Itching   Lipitor [atorvastatin Calcium] Other (See Comments)   Myalgia   Tape Itching      Medication List    STOP taking these medications   aspirin EC 81 MG tablet   lisinopril 5 MG tablet Commonly known as:  PRINIVIL,ZESTRIL     TAKE these medications  amLODipine 5 MG tablet Commonly known as:  NORVASC Take 1 tablet (5 mg total) by mouth daily. Start taking on:  03/26/2018   calcium-vitamin D 500-200 MG-UNIT tablet Commonly known as:  OSCAL WITH D Take 1 tablet by mouth.   ELIQUIS STARTER PACK 5 MG Tabs Take as directed on package: start with two-5mg  tablets twice daily for 7 days. On day 8, switch to  one-5mg  tablet twice daily.   levothyroxine 88 MCG tablet Commonly known as:  SYNTHROID, LEVOTHROID Take 88 mcg by mouth daily before breakfast. What changed:  Another medication with the same name was removed. Continue taking this medication, and follow the directions you see here.   polyethylene glycol packet Commonly known as:  MIRALAX / GLYCOLAX Take 17 g by mouth daily as needed for mild constipation.   psyllium 58.6 % packet Commonly known as:  METAMUCIL Take 1 packet by mouth daily.   senna 8.6 MG tablet Commonly known as:  SENOKOT Take 2 tablets by mouth as needed for constipation.      Allergies  Allergen Reactions  . Latex Itching  . Lipitor [Atorvastatin Calcium] Other (See Comments)    Myalgia   . Tape Itching   Discharge Instructions    Diet - low sodium heart healthy   Complete by:  As directed    Discharge instructions   Complete by:  As directed    It is important that you read following instructions as well as go over your medication list with RN to help you understand your care after this hospitalization.  Discharge Instructions: Please follow-up with PCP in one week  Please request your primary care physician to go over all Hospital Tests and Procedure/Radiological results at the follow up,  Please get all Hospital records sent to your PCP by signing hospital release before you go home.   Do not take more than prescribed Pain, Sleep and Anxiety Medications. You were cared for by a hospitalist during your hospital stay. If you have any questions about your discharge medications or the care you received while you were in the hospital after you are discharged, you can call the unit you were admitted to and ask to speak with the hospitalist on call if the hospitalist that took care of you is not available.  Once you are discharged, your primary care physician will handle any further medical issues. Please note that NO REFILLS for any discharge medications  will be authorized once you are discharged, as it is imperative that you return to your primary care physician (or establish a relationship with a primary care physician if you do not have one) for your aftercare needs so that they can reassess your need for medications and monitor your lab values. You Must read complete instructions/literature along with all the possible adverse reactions/side effects for all the Medicines you take and that have been prescribed to you. Take any new Medicines after you have completely understood and accept all the possible adverse reactions/side effects. Wear Seat belts while driving. If you have smoked or chewed Tobacco in the last 2 yrs please stop smoking and/or stop any Recreational drug use.   Increase activity slowly   Complete by:  As directed      Discharge Exam: Filed Weights   03/22/18 1147  Weight: 68 kg   Vitals:   03/24/18 2357 03/25/18 0813  BP: 127/62 (!) 141/62  Pulse: 73 67  Resp: 20   Temp: 98 F (36.7 C) 97.9 F (36.6 C)  SpO2: 98% 97%   General: Appear in no distress, no Rash; Oral Mucosa moist. Cardiovascular: S1 and S2 Present, no Murmur, no JVD Respiratory: Bilateral Air entry present and Clear to Auscultation, no Crackles, no wheezes Abdomen: Bowel Sound present, Soft and no tenderness Extremities: no Pedal edema, no calf tenderness Neurology: Grossly no focal neuro deficit.  The results of significant diagnostics from this hospitalization (including imaging, microbiology, ancillary and laboratory) are listed below for reference.    Significant Diagnostic Studies: Dg Chest 2 View  Result Date: 03/22/2018 CLINICAL DATA:  Shortness of breath for 2-3 weeks. EXAM: CHEST - 2 VIEW COMPARISON:  Chest radiograph 03/06/2016, chest CT 05/11/2014 FINDINGS: Mildly enlarged cardiac silhouette. Mediastinal contours appear intact. Calcific atherosclerotic disease the aorta. There is no evidence of pleural effusion or pneumothorax. 1.9 cm  rounded density overlies the right upper hemithorax. Stable calcified right lower lobe granuloma. Probable 1.5 cm left suprahilar lymph node. Osseous structures are without acute abnormality. Soft tissues are grossly normal. IMPRESSION: 1.9 cm nodular density overlies the right upper hemithorax. This may represent an area of atelectasis, however pulmonary nodule cannot be excluded. Non-contrast CT chest may be considered, if found warranted. Electronically Signed   By: Fidela Salisbury M.D.   On: 03/22/2018 13:31   Ct Angio Chest Pe W And/or Wo Contrast  Result Date: 03/22/2018 CLINICAL DATA:  Short of breath for several days.  Elevated D-dimer. EXAM: CT ANGIOGRAPHY CHEST WITH CONTRAST TECHNIQUE: Multidetector CT imaging of the chest was performed using the standard protocol during bolus administration of intravenous contrast. Multiplanar CT image reconstructions and MIPs were obtained to evaluate the vascular anatomy. CONTRAST:  2mL ISOVUE-370 IOPAMIDOL (ISOVUE-370) INJECTION 76% COMPARISON:  CT chest 05/11/2014 FINDINGS: Cardiovascular: Normal heart size. Aortic atherosclerosis. Calcifications in the LAD, left circumflex and RCA coronary arteries noted. Large bilateral central obstructing pulmonary emboli are identified. Pulmonary emboli are identified within the right upper lobe, right middle lobe and right lower lobe lobar and segmental pulmonary arteries. Similarly there are upper and lower and lingular lobar pulmonary artery filling defects on the left. The RV to LV ratio is equal to 5.3/3.8 = 1.39 consistent with right heart strain. Reflux of contrast material into the IVC and hepatic veins reflecting increased right heart pressures. Mediastinum/Nodes: Mediastinal adenopathy is identified, new from previous exam. Left pre-vascular lymph node measures 1.5 cm, image 53/5. Sub-carinal lymph node measures 1.9 cm, image 69/5. Right paratracheal lymph node measures 1.7 cm, image 48/5. Prominent right  supraclavicular lymph nodes identified measuring up to 1.1 cm. No axillary adenopathy. Lungs/Pleura: Calcified granuloma identified in the right lower lobe. Subpleural nodular consolidation within the lateral right upper lobe is identified, image 56/6. Stable solid round nodule in the right middle lobe measuring 5 mm, image 85/6. Upper Abdomen: No acute abnormality. Musculoskeletal: No chest wall abnormality. No acute or significant osseous findings. Review of the MIP images confirms the above findings. IMPRESSION: 1. Positive for acute PE with CT evidence of right heart strain (RV/LV Ratio = 1.39) consistent with at least submassive (intermediate risk) PE. The presence of right heart strain has been associated with an increased risk of morbidity and mortality. Please activate Code PE by paging [phone]. 2. New mediastinal adenopathy identified compared with 05/11/2014. Etiology indeterminate. Findings may reflect lymphoproliferative disorder, metastatic disease, granulomatous inflammation or infection. Followup imaging in 3 months is recommended. This recommendation follows ACR consensus guidelines: Managing Incidental Findings on Thoracic CT: Mediastinal and Cardiovascular Findings. A White Paper of the ACR Incidental Findings  Committee. J Am Coll Radiol. 2018; 15: 1610-9604. 3. Subpleural nodular densities within the right upper lobe are identified. These may represent areas of subpleural infarct secondary to pulmonary emboli. Attention on follow-up imaging is recommended. 4.  Aortic Atherosclerosis (ICD10-I70.0). 5. Coronary artery atherosclerotic calcifications. Critical Value/emergent results were called by telephone at the time of interpretation on 03/22/2018 at 2:44 pm to Dr. Orlie Dakin , who verbally acknowledged these results. Electronically Signed   By: Kerby Moors M.D.   On: 03/22/2018 14:44   Vas [place] Lower Extremity Venous (dvt)  Result Date: 03/24/2018  Lower Venous Study Indications: SOB.   Risk Factors: Confirmed PE. Performing Technologist: Sharion Dove RVS  Examination Guidelines: A complete evaluation includes B-mode imaging, spectral Doppler, color Doppler, and power Doppler as needed of all accessible portions of each vessel. Bilateral testing is considered an integral part of a complete examination. Limited examinations for reoccurring indications may be performed as noted.  Right Venous Findings: +---------+---------------+---------+-----------+----------+-------+          CompressibilityPhasicitySpontaneityPropertiesSummary +---------+---------------+---------+-----------+----------+-------+ CFV      Full           Yes      Yes                          +---------+---------------+---------+-----------+----------+-------+ SFJ      Full                                                 +---------+---------------+---------+-----------+----------+-------+ FV Prox  Full                                                 +---------+---------------+---------+-----------+----------+-------+ FV Mid   Full                                                 +---------+---------------+---------+-----------+----------+-------+ FV DistalFull                                                 +---------+---------------+---------+-----------+----------+-------+ PFV      Full                                                 +---------+---------------+---------+-----------+----------+-------+ POP      Full           Yes      Yes                          +---------+---------------+---------+-----------+----------+-------+ PTV      Full                                                 +---------+---------------+---------+-----------+----------+-------+  PERO     Full                                                 +---------+---------------+---------+-----------+----------+-------+  Left Venous Findings:  +---------+---------------+---------+-----------+----------+-------+          CompressibilityPhasicitySpontaneityPropertiesSummary +---------+---------------+---------+-----------+----------+-------+ CFV      Full           Yes      Yes                          +---------+---------------+---------+-----------+----------+-------+ SFJ      Full                                                 +---------+---------------+---------+-----------+----------+-------+ FV Prox  Full                                                 +---------+---------------+---------+-----------+----------+-------+ FV Mid   Full                                                 +---------+---------------+---------+-----------+----------+-------+ FV DistalFull                                                 +---------+---------------+---------+-----------+----------+-------+ PFV      Full                                                 +---------+---------------+---------+-----------+----------+-------+ POP      Full           Yes      Yes                          +---------+---------------+---------+-----------+----------+-------+ PTV      None                                         Acute   +---------+---------------+---------+-----------+----------+-------+ PERO     None                                         Acute   +---------+---------------+---------+-----------+----------+-------+    Summary: Right: There is no evidence of deep vein thrombosis in the lower extremity. Left: Findings consistent with acute deep vein thrombosis involving the left posterior tibial vein, and left peroneal vein.  *See table(s) above for measurements and observations. Electronically signed by Servando Snare MD on 03/24/2018 at 3:30:00 PM.  Final     Microbiology: No results found for this or any previous visit (from the past 240 hour(s)).   Labs: CBC: Recent Labs  Lab 03/22/18 1154  03/23/18 0530 03/24/18 0214 03/25/18 0242  WBC 7.4 7.4 7.2 10.5  NEUTROABS 4.3  --   --   --   HGB 14.1 12.6 13.1 14.1  HCT 44.3 40.8 42.9 45.2  MCV 91.3 91.9 93.3 92.8  PLT 227 244 235 481   Basic Metabolic Panel: Recent Labs  Lab 03/22/18 1154 03/23/18 0215 03/24/18 1852  NA 139 139 136  K 4.0 3.4* 3.9  CL 108 109 103  CO2 23 25 25   GLUCOSE 112* 106* 200*  BUN 11 7* 13  CREATININE 0.67 0.58 0.80  CALCIUM 9.4 8.9 9.6  MG  --  2.1 2.0   Liver Function Tests: No results for input(s): AST, ALT, ALKPHOS, BILITOT, PROT, ALBUMIN in the last 168 hours. No results for input(s): LIPASE, AMYLASE in the last 168 hours. No results for input(s): AMMONIA in the last 168 hours. Cardiac Enzymes: Recent Labs  Lab 03/22/18 1751 03/23/18 0044  TROPONINI 0.12* 0.09*   BNP (last 3 results) Recent Labs    03/22/18 1154  BNP 403.2*   CBG: No results for input(s): GLUCAP in the last 168 hours. Time spent: 35 minutes  Signed:  Berle Mull  [facility]  03/25/2018  , 1:58 PM

## 2018-03-25 NOTE — Care Management Note (Addendum)
Case Management Note  Patient Details  Name: Kylie Walters MRN: [id number] Date of Birth: [birth year]  Subjective/Objective:   Patient for dc to [facility] SNF , CSW facilitating dc. Patient has the 30 day savings card for eliquis and was informed of copay of 293.64 due to deductible not being met. She will use the 30 day free when she goes home from SNF.                  Action/Plan: DC to SNF .  Expected Discharge Date:  03/25/18               Expected Discharge Plan:  [place]  In-House Referral:  Clinical Social Work  Discharge planning Services  CM Consult, Medication Assistance  Post Acute Care Choice:    Choice offered to:     DME Arranged:    DME Agency:     HH Arranged:    [place] Agency:     Status of Service:  Completed, signed off  If discussed at [employer], dates discussed:    Additional Comments:  Zenon Mayo, RN 03/25/2018, 12:00 PM

## 2018-03-25 NOTE — Care Management Important Message (Signed)
Important Message  Patient Details  Name: Kylie Walters MRN: [id number] Date of Birth: 05-30-1924   Medicare Important Message Given:  Yes    Baleigh Rennaker 03/25/2018, 10:22 AM

## 2018-03-25 NOTE — Clinical Social Work Placement (Signed)
   CLINICAL SOCIAL WORK PLACEMENT  NOTE  Date:  03/25/2018  Patient Details  Name: GENELLE ECONOMOU MRN: [id number] Date of Birth: 1924/07/17  Clinical Social Work is seeking post-discharge placement for this patient at the [place] level of care (*CSW will initial, date and re-position this form in  chart as items are completed):      Patient/family provided with [place] Work Department's list of facilities offering this level of care within the geographic area requested by the patient (or if unable, by the patient's family).  Yes   Patient/family informed of their freedom to choose among providers that offer the needed level of care, that participate in Medicare, Medicaid or managed care program needed by the patient, have an available bed and are willing to accept the patient.      Patient/family informed of [place]'s ownership interest in [facility] and [facility], as well as of the fact that they are under no obligation to receive care at these facilities.  PASRR submitted to EDS on       PASRR number received on 03/24/18     Existing PASRR number confirmed on       FL2 transmitted to all facilities in geographic area requested by pt/family on 03/24/18     FL2 transmitted to all facilities within larger geographic area on       Patient informed that his/her managed care company has contracts with or will negotiate with certain facilities, including the following:        Yes   Patient/family informed of bed offers received.  Patient chooses bed at [facility] recommends and patient chooses bed at      Patient to be transferred to [facility] on 03/25/18.  Patient to be transferred to facility by [facility]     Patient family notified on 03/25/18 of transfer.  Name of family member notified:  Baker Janus     PHYSICIAN       Additional Comment:     _______________________________________________ Eileen Stanford, LCSW 03/25/2018, 12:27 PM

## 2018-03-27 ENCOUNTER — Telehealth: Payer: Self-pay

## 2018-03-27 DIAGNOSIS — M6281 Muscle weakness (generalized): Secondary | ICD-10-CM | POA: Diagnosis not present

## 2018-03-27 DIAGNOSIS — E039 Hypothyroidism, unspecified: Secondary | ICD-10-CM | POA: Diagnosis not present

## 2018-03-27 DIAGNOSIS — I2693 Single subsegmental pulmonary embolism without acute cor pulmonale: Secondary | ICD-10-CM | POA: Diagnosis not present

## 2018-03-27 DIAGNOSIS — I10 Essential (primary) hypertension: Secondary | ICD-10-CM | POA: Diagnosis not present

## 2018-03-27 NOTE — Telephone Encounter (Signed)
03/27/18   Returned patients call left message on answering machine for return call regarding appointment.

## 2018-03-27 NOTE — Telephone Encounter (Signed)
Copied from [place] [id number]. Topic: Appointment Scheduling - Scheduling Inquiry for Clinic >> Mar 26, 2018  1:05 PM Lennox Solders wrote: Reason for CRM: pt was discharge from  [place] last night. Pt is a [facility] now. Pt had PE.  Pt would like post hos fup

## 2018-03-30 ENCOUNTER — Telehealth: Payer: Self-pay

## 2018-03-30 NOTE — Telephone Encounter (Signed)
Transition Care Management Follow-up Telephone Call  ADMISSION DATE: 03/22/18 DISCHARGE DATE: 03/25/18   How have you been since you were released from the hospital? Feeling much better.  Do you understand why you were in the hospital? Yes   Do you understand the discharge instrcutions? Yes  Items Reviewed:  Medications reviewed: Yes. Problem with stool softeners.   Allergies reviewed: Yes  Dietary changes reviewed:Yes Low Sodium Heart healthy.   Referrals reviewed: Appointment scheduled in time to make arrangements with [facility] who will be transporting per patient.   Functional Questionnaire:   Activities of Daily Living (ADLs): Patietn states she can perform all independently.   Any patient concerns? Would like to know more about her condition and what to expect.   Confirmed importance and date/time of follow-up visits scheduled: Yes    Confirmed with patient if condition begins to worsen call PCP or go to the ER. Yes    Patient was given the office number and encouragred to call back with questions or concerns. Yes

## 2018-03-31 ENCOUNTER — Telehealth: Payer: Self-pay

## 2018-03-31 NOTE — Telephone Encounter (Signed)
Copied from [place] [phone]. Topic: Appointment Scheduling - Scheduling Inquiry for Clinic >> Mar 26, 2018  1:05 PM Lennox Solders wrote: Reason for CRM: pt was discharged from  [place] last night. Pt is a [facility] now. Pt had PE.  Pt would like post hos fup

## 2018-03-31 NOTE — Telephone Encounter (Signed)
Copied from [place] [id number]. Topic: Appointment Scheduling - Scheduling Inquiry for Clinic >> Mar 26, 2018  1:05 PM Lennox Solders wrote: Reason for CRM: pt was discharge from  [place] last night. Pt is a [facility] now. Pt had PE.  Pt would like post hos fup

## 2018-04-03 DIAGNOSIS — I519 Heart disease, unspecified: Secondary | ICD-10-CM | POA: Diagnosis not present

## 2018-04-03 DIAGNOSIS — R2689 Other abnormalities of gait and mobility: Secondary | ICD-10-CM | POA: Diagnosis not present

## 2018-04-03 DIAGNOSIS — I82442 Acute embolism and thrombosis of left tibial vein: Secondary | ICD-10-CM | POA: Diagnosis not present

## 2018-04-03 DIAGNOSIS — R2681 Unsteadiness on feet: Secondary | ICD-10-CM | POA: Diagnosis not present

## 2018-04-03 DIAGNOSIS — M6281 Muscle weakness (generalized): Secondary | ICD-10-CM | POA: Diagnosis not present

## 2018-04-03 DIAGNOSIS — I2699 Other pulmonary embolism without acute cor pulmonale: Secondary | ICD-10-CM | POA: Diagnosis not present

## 2018-04-06 DIAGNOSIS — I519 Heart disease, unspecified: Secondary | ICD-10-CM | POA: Diagnosis not present

## 2018-04-06 DIAGNOSIS — M6281 Muscle weakness (generalized): Secondary | ICD-10-CM | POA: Diagnosis not present

## 2018-04-06 DIAGNOSIS — I2699 Other pulmonary embolism without acute cor pulmonale: Secondary | ICD-10-CM | POA: Diagnosis not present

## 2018-04-06 DIAGNOSIS — I82442 Acute embolism and thrombosis of left tibial vein: Secondary | ICD-10-CM | POA: Diagnosis not present

## 2018-04-06 DIAGNOSIS — R2681 Unsteadiness on feet: Secondary | ICD-10-CM | POA: Diagnosis not present

## 2018-04-06 DIAGNOSIS — R2689 Other abnormalities of gait and mobility: Secondary | ICD-10-CM | POA: Diagnosis not present

## 2018-04-07 DIAGNOSIS — R2689 Other abnormalities of gait and mobility: Secondary | ICD-10-CM | POA: Diagnosis not present

## 2018-04-07 DIAGNOSIS — I2699 Other pulmonary embolism without acute cor pulmonale: Secondary | ICD-10-CM | POA: Diagnosis not present

## 2018-04-07 DIAGNOSIS — R2681 Unsteadiness on feet: Secondary | ICD-10-CM | POA: Diagnosis not present

## 2018-04-07 DIAGNOSIS — M6281 Muscle weakness (generalized): Secondary | ICD-10-CM | POA: Diagnosis not present

## 2018-04-07 DIAGNOSIS — I519 Heart disease, unspecified: Secondary | ICD-10-CM | POA: Diagnosis not present

## 2018-04-07 DIAGNOSIS — I82442 Acute embolism and thrombosis of left tibial vein: Secondary | ICD-10-CM | POA: Diagnosis not present

## 2018-04-08 DIAGNOSIS — I2699 Other pulmonary embolism without acute cor pulmonale: Secondary | ICD-10-CM | POA: Diagnosis not present

## 2018-04-08 DIAGNOSIS — R2689 Other abnormalities of gait and mobility: Secondary | ICD-10-CM | POA: Diagnosis not present

## 2018-04-08 DIAGNOSIS — I82442 Acute embolism and thrombosis of left tibial vein: Secondary | ICD-10-CM | POA: Diagnosis not present

## 2018-04-08 DIAGNOSIS — R2681 Unsteadiness on feet: Secondary | ICD-10-CM | POA: Diagnosis not present

## 2018-04-08 DIAGNOSIS — M6281 Muscle weakness (generalized): Secondary | ICD-10-CM | POA: Diagnosis not present

## 2018-04-08 DIAGNOSIS — I519 Heart disease, unspecified: Secondary | ICD-10-CM | POA: Diagnosis not present

## 2018-04-10 DIAGNOSIS — M6281 Muscle weakness (generalized): Secondary | ICD-10-CM | POA: Diagnosis not present

## 2018-04-10 DIAGNOSIS — I2699 Other pulmonary embolism without acute cor pulmonale: Secondary | ICD-10-CM | POA: Diagnosis not present

## 2018-04-10 DIAGNOSIS — I82442 Acute embolism and thrombosis of left tibial vein: Secondary | ICD-10-CM | POA: Diagnosis not present

## 2018-04-10 DIAGNOSIS — I519 Heart disease, unspecified: Secondary | ICD-10-CM | POA: Diagnosis not present

## 2018-04-10 DIAGNOSIS — R2681 Unsteadiness on feet: Secondary | ICD-10-CM | POA: Diagnosis not present

## 2018-04-10 DIAGNOSIS — R2689 Other abnormalities of gait and mobility: Secondary | ICD-10-CM | POA: Diagnosis not present

## 2018-04-13 DIAGNOSIS — M6281 Muscle weakness (generalized): Secondary | ICD-10-CM | POA: Diagnosis not present

## 2018-04-13 DIAGNOSIS — R2689 Other abnormalities of gait and mobility: Secondary | ICD-10-CM | POA: Diagnosis not present

## 2018-04-13 DIAGNOSIS — I519 Heart disease, unspecified: Secondary | ICD-10-CM | POA: Diagnosis not present

## 2018-04-13 DIAGNOSIS — I2699 Other pulmonary embolism without acute cor pulmonale: Secondary | ICD-10-CM | POA: Diagnosis not present

## 2018-04-13 DIAGNOSIS — R2681 Unsteadiness on feet: Secondary | ICD-10-CM | POA: Diagnosis not present

## 2018-04-13 DIAGNOSIS — I82442 Acute embolism and thrombosis of left tibial vein: Secondary | ICD-10-CM | POA: Diagnosis not present

## 2018-04-16 DIAGNOSIS — I2699 Other pulmonary embolism without acute cor pulmonale: Secondary | ICD-10-CM | POA: Diagnosis not present

## 2018-04-16 DIAGNOSIS — R2689 Other abnormalities of gait and mobility: Secondary | ICD-10-CM | POA: Diagnosis not present

## 2018-04-16 DIAGNOSIS — R2681 Unsteadiness on feet: Secondary | ICD-10-CM | POA: Diagnosis not present

## 2018-04-16 DIAGNOSIS — I82442 Acute embolism and thrombosis of left tibial vein: Secondary | ICD-10-CM | POA: Diagnosis not present

## 2018-04-16 DIAGNOSIS — M6281 Muscle weakness (generalized): Secondary | ICD-10-CM | POA: Diagnosis not present

## 2018-04-16 DIAGNOSIS — I519 Heart disease, unspecified: Secondary | ICD-10-CM | POA: Diagnosis not present

## 2018-04-17 DIAGNOSIS — I2699 Other pulmonary embolism without acute cor pulmonale: Secondary | ICD-10-CM | POA: Diagnosis not present

## 2018-04-17 DIAGNOSIS — R2689 Other abnormalities of gait and mobility: Secondary | ICD-10-CM | POA: Diagnosis not present

## 2018-04-17 DIAGNOSIS — I82442 Acute embolism and thrombosis of left tibial vein: Secondary | ICD-10-CM | POA: Diagnosis not present

## 2018-04-17 DIAGNOSIS — R2681 Unsteadiness on feet: Secondary | ICD-10-CM | POA: Diagnosis not present

## 2018-04-17 DIAGNOSIS — I519 Heart disease, unspecified: Secondary | ICD-10-CM | POA: Diagnosis not present

## 2018-04-17 DIAGNOSIS — M6281 Muscle weakness (generalized): Secondary | ICD-10-CM | POA: Diagnosis not present

## 2018-04-20 DIAGNOSIS — M6281 Muscle weakness (generalized): Secondary | ICD-10-CM | POA: Diagnosis not present

## 2018-04-20 DIAGNOSIS — I82442 Acute embolism and thrombosis of left tibial vein: Secondary | ICD-10-CM | POA: Diagnosis not present

## 2018-04-20 DIAGNOSIS — R2681 Unsteadiness on feet: Secondary | ICD-10-CM | POA: Diagnosis not present

## 2018-04-20 DIAGNOSIS — I519 Heart disease, unspecified: Secondary | ICD-10-CM | POA: Diagnosis not present

## 2018-04-20 DIAGNOSIS — R2689 Other abnormalities of gait and mobility: Secondary | ICD-10-CM | POA: Diagnosis not present

## 2018-04-20 DIAGNOSIS — I2699 Other pulmonary embolism without acute cor pulmonale: Secondary | ICD-10-CM | POA: Diagnosis not present

## 2018-04-22 DIAGNOSIS — I82442 Acute embolism and thrombosis of left tibial vein: Secondary | ICD-10-CM | POA: Diagnosis not present

## 2018-04-22 DIAGNOSIS — I2699 Other pulmonary embolism without acute cor pulmonale: Secondary | ICD-10-CM | POA: Diagnosis not present

## 2018-04-22 DIAGNOSIS — I519 Heart disease, unspecified: Secondary | ICD-10-CM | POA: Diagnosis not present

## 2018-04-22 DIAGNOSIS — M6281 Muscle weakness (generalized): Secondary | ICD-10-CM | POA: Diagnosis not present

## 2018-04-22 DIAGNOSIS — R2681 Unsteadiness on feet: Secondary | ICD-10-CM | POA: Diagnosis not present

## 2018-04-22 DIAGNOSIS — R2689 Other abnormalities of gait and mobility: Secondary | ICD-10-CM | POA: Diagnosis not present

## 2018-04-24 DIAGNOSIS — I519 Heart disease, unspecified: Secondary | ICD-10-CM | POA: Diagnosis not present

## 2018-04-24 DIAGNOSIS — I82442 Acute embolism and thrombosis of left tibial vein: Secondary | ICD-10-CM | POA: Diagnosis not present

## 2018-04-24 DIAGNOSIS — M6281 Muscle weakness (generalized): Secondary | ICD-10-CM | POA: Diagnosis not present

## 2018-04-24 DIAGNOSIS — R2681 Unsteadiness on feet: Secondary | ICD-10-CM | POA: Diagnosis not present

## 2018-04-24 DIAGNOSIS — R2689 Other abnormalities of gait and mobility: Secondary | ICD-10-CM | POA: Diagnosis not present

## 2018-04-24 DIAGNOSIS — I2699 Other pulmonary embolism without acute cor pulmonale: Secondary | ICD-10-CM | POA: Diagnosis not present

## 2018-04-28 DIAGNOSIS — R2681 Unsteadiness on feet: Secondary | ICD-10-CM | POA: Diagnosis not present

## 2018-04-28 DIAGNOSIS — I519 Heart disease, unspecified: Secondary | ICD-10-CM | POA: Diagnosis not present

## 2018-04-28 DIAGNOSIS — R2689 Other abnormalities of gait and mobility: Secondary | ICD-10-CM | POA: Diagnosis not present

## 2018-04-28 DIAGNOSIS — I2699 Other pulmonary embolism without acute cor pulmonale: Secondary | ICD-10-CM | POA: Diagnosis not present

## 2018-04-28 DIAGNOSIS — M6281 Muscle weakness (generalized): Secondary | ICD-10-CM | POA: Diagnosis not present

## 2018-04-28 DIAGNOSIS — I82442 Acute embolism and thrombosis of left tibial vein: Secondary | ICD-10-CM | POA: Diagnosis not present

## 2018-04-29 DIAGNOSIS — I82442 Acute embolism and thrombosis of left tibial vein: Secondary | ICD-10-CM | POA: Diagnosis not present

## 2018-04-29 DIAGNOSIS — R2681 Unsteadiness on feet: Secondary | ICD-10-CM | POA: Diagnosis not present

## 2018-04-29 DIAGNOSIS — M6281 Muscle weakness (generalized): Secondary | ICD-10-CM | POA: Diagnosis not present

## 2018-04-29 DIAGNOSIS — R2689 Other abnormalities of gait and mobility: Secondary | ICD-10-CM | POA: Diagnosis not present

## 2018-04-29 DIAGNOSIS — I2699 Other pulmonary embolism without acute cor pulmonale: Secondary | ICD-10-CM | POA: Diagnosis not present

## 2018-04-29 DIAGNOSIS — I519 Heart disease, unspecified: Secondary | ICD-10-CM | POA: Diagnosis not present

## 2018-04-30 ENCOUNTER — Encounter: Payer: Self-pay | Admitting: Family Medicine

## 2018-04-30 ENCOUNTER — Ambulatory Visit (INDEPENDENT_AMBULATORY_CARE_PROVIDER_SITE_OTHER): Payer: Medicare Other | Admitting: Family Medicine

## 2018-04-30 VITALS — BP 122/78 | HR 69 | Temp 97.8°F | Resp 18 | Wt 148.8 lb

## 2018-04-30 DIAGNOSIS — E559 Vitamin D deficiency, unspecified: Secondary | ICD-10-CM

## 2018-04-30 DIAGNOSIS — M8580 Other specified disorders of bone density and structure, unspecified site: Secondary | ICD-10-CM | POA: Diagnosis not present

## 2018-04-30 DIAGNOSIS — I10 Essential (primary) hypertension: Secondary | ICD-10-CM | POA: Diagnosis not present

## 2018-04-30 DIAGNOSIS — I499 Cardiac arrhythmia, unspecified: Secondary | ICD-10-CM | POA: Diagnosis not present

## 2018-04-30 DIAGNOSIS — R001 Bradycardia, unspecified: Secondary | ICD-10-CM

## 2018-04-30 DIAGNOSIS — E039 Hypothyroidism, unspecified: Secondary | ICD-10-CM | POA: Diagnosis not present

## 2018-04-30 DIAGNOSIS — M1990 Unspecified osteoarthritis, unspecified site: Secondary | ICD-10-CM | POA: Diagnosis not present

## 2018-04-30 DIAGNOSIS — R739 Hyperglycemia, unspecified: Secondary | ICD-10-CM

## 2018-04-30 LAB — MICROALBUMIN / CREATININE URINE RATIO
Creatinine,U: 64.4 mg/dL
Microalb Creat Ratio: 1.1 mg/g (ref 0.0–30.0)
Microalb, Ur: 0.7 mg/dL (ref 0.0–1.9)

## 2018-04-30 LAB — VITAMIN D 25 HYDROXY (VIT D DEFICIENCY, FRACTURES): VITD: 32.53 ng/mL (ref 30.00–100.00)

## 2018-04-30 LAB — TSH: TSH: 4.06 u[IU]/mL (ref 0.35–4.50)

## 2018-04-30 LAB — HEMOGLOBIN A1C: Hgb A1c MFr Bld: 6.1 % (ref 4.6–6.5)

## 2018-04-30 MED ORDER — APIXABAN 5 MG PO TABS: 5.0000 mg | ORAL_TABLET | Freq: Two times a day (BID) | ORAL | 3 refills | Status: AC

## 2018-04-30 NOTE — Patient Instructions (Signed)
Pulmonary Embolism    A pulmonary embolism (PE) is a sudden blockage or decrease of blood flow in one lung or both lungs. Most blockages come from a blood clot that forms in a lower leg, thigh, or arm vein (deep vein thrombosis, DVT) and travels to the lungs. A clot is blood that has thickened into a gel or solid. PE is a dangerous and life-threatening condition that needs to be treated right away.  What are the causes?  This condition is usually caused by a blood clot that forms in a vein and moves to the lungs. In rare cases, it may be caused by air, fat, part of a tumor, or other tissue that moves through the veins and into the lungs.  What increases the risk?  The following factors may make you more likely to develop this condition:  · Traumatic injury, such as breaking a hip or leg.  · Spinal cord injury.  · Orthopedic surgery, especially hip or knee replacement.  · Any major surgery.  · Stroke.  · Having DVT.  · Blood clots or blood clotting disease.  · Long-term (chronic) lung or heart disease.  · Taking medicines that contain estrogen. These include birth control pills and hormone replacement therapy.  · Cancer and chemotherapy.  · Having a central venous catheter.  · Pregnancy and the period of time after delivery (postpartum).  · Being older than age 60.  · Being overweight.  · Smoking.  What are the signs or symptoms?  Symptoms of this condition usually start suddenly and include:  · Shortness of breath during activity or at rest.  · Coughing or coughing up blood or blood-tinged mucus.  · Chest pain that is often worse with deep breaths.  · Rapid or irregular heartbeat.  · Feeling light-headed or dizzy.  · Fainting.  · Feeling anxious.  · Fever.  · Sweating.  · Pain and swelling in a leg. This is a symptom of DVT, which can lead to PE.  How is this diagnosed?  This condition may be diagnosed based on:  · Your medical history.  · A physical exam.  · Blood tests.  · CT pulmonary angiogram. This test checks  blood flow in and around your lungs.  · Ventilation-perfusion scan, also called a lung VQ scan. This test measures air flow and blood flow to the lungs.  · Ultrasound of the legs.  How is this treated?  Treatment for this condition depends on many factors, such as the cause of your PE, your risk for bleeding or developing more clots, and other medical conditions you have. Treatment aims to remove, dissolve, or stop blood clots from forming or growing larger. Treatment may include:  · Medicines, such as:  ? Blood thinning medicines (anticoagulants) to stop clots from forming or growing.  ? Medicines that dissolve clots (thrombolytics).  · Procedures, such as:  ? Using a flexible tube to remove a blood clot (embolectomy) or deliver medicine to destroy it (catheter-directed thrombolysis).  ? Inserting a filter into a large vein that carries blood to the heart (inferior vena cava). This filter (vena cava filter) catches blood clots before they reach the lungs.  ? Surgery to remove the clot (surgical embolectomy). This is rare.  You may need a combination of immediate, long-term (up to 3 months after diagnosis), and extended (more than 3 months after diagnosis) treatments. Your treatment may continue for several months (maintenance therapy). You and your health care provider will work together   to choose the treatment program that is best for you.  Follow these instructions at home:  Medicines  · Take over-the-counter and prescription medicines only as told by your health care provider.  · If you are taking an anticoagulant medicine:  ? Take the medicine every day at the same time each day.  ? Understand what foods and drugs interact with your medicine.  ? Understand the side effects of this medicine, including excessive bruising or bleeding. Ask your health care provider or pharmacist about other side effects.  General instructions  · Wear a medical alert bracelet or carry a medical alert card that says you have had a PE  and lists what medicines you take.  · Ask your health care provider when you may return to your normal activities. Avoid sitting or lying for a long time without moving.  · Maintain a healthy weight. Ask your health care provider what weight is healthy for you.  · Do not use any products that contain nicotine or tobacco, such as cigarettes and e-cigarettes. If you need help quitting, ask your health care provider.  · Talk with your health care provider about any travel plans. It is important to make sure that you are still able to take your medicine while on trips.  · Keep all follow-up visits as told by your health care provider. This is important.  Contact a health care provider if:  · You missed a dose of your blood thinner medicine.  Get help right away if:  · You have:  ? New or increased pain, swelling, warmth, or redness in an arm or leg.  ? Numbness or tingling in an arm or leg.  ? Shortness of breath during activity or at rest.  ? A fever.  ? Chest pain.  ? A rapid or irregular heartbeat.  ? A severe headache.  ? Vision changes.  ? A serious fall or accident, or you hit your head.  ? Stomach (abdominal) pain.  ? Blood in your vomit, stool, or urine.  ? A cut that will not stop bleeding.  · You cough up blood.  · You feel light-headed or dizzy.  · You cannot move your arms or legs.  · You are confused or have memory loss.  These symptoms may represent a serious problem that is an emergency. Do not wait to see if the symptoms will go away. Get medical help right away. Call your local emergency services (911 in the [place] Do not drive yourself to the hospital.  Summary  · A pulmonary embolism (PE) is a sudden blockage or decrease of blood flow in one lung or both lungs. PE is a dangerous and life-threatening condition that needs to be treated right away.  · Treatments for this condition usually include medicines to thin your blood (anticoagulants) or medicines to break apart blood clots (thrombolytics).  · If  you are given blood thinners, it is important to take the medicine every single day at the same time each day.  · If you have signs of PE or DVT, call your local emergency services (911 in the [place]  This information is not intended to replace advice given to you by your health care provider. Make sure you discuss any questions you have with your health care provider.  Document Released: 04/05/2000 Document Revised: 11/21/2017 Document Reviewed: 05/22/2017  Elsevier Interactive Patient Education © 2019 [employer]

## 2018-05-01 DIAGNOSIS — M79674 Pain in right toe(s): Secondary | ICD-10-CM | POA: Diagnosis not present

## 2018-05-01 DIAGNOSIS — M79675 Pain in left toe(s): Secondary | ICD-10-CM | POA: Diagnosis not present

## 2018-05-01 DIAGNOSIS — I82442 Acute embolism and thrombosis of left tibial vein: Secondary | ICD-10-CM | POA: Diagnosis not present

## 2018-05-01 DIAGNOSIS — B351 Tinea unguium: Secondary | ICD-10-CM | POA: Diagnosis not present

## 2018-05-01 DIAGNOSIS — R2681 Unsteadiness on feet: Secondary | ICD-10-CM | POA: Diagnosis not present

## 2018-05-01 DIAGNOSIS — I2699 Other pulmonary embolism without acute cor pulmonale: Secondary | ICD-10-CM | POA: Diagnosis not present

## 2018-05-01 DIAGNOSIS — R2689 Other abnormalities of gait and mobility: Secondary | ICD-10-CM | POA: Diagnosis not present

## 2018-05-01 DIAGNOSIS — I519 Heart disease, unspecified: Secondary | ICD-10-CM | POA: Diagnosis not present

## 2018-05-01 DIAGNOSIS — M6281 Muscle weakness (generalized): Secondary | ICD-10-CM | POA: Diagnosis not present

## 2018-05-03 NOTE — Assessment & Plan Note (Signed)
Encouraged to get adequate exercise, calcium and vitamin d intake 

## 2018-05-03 NOTE — Progress Notes (Signed)
Subjective:    Patient ID: Kylie Walters, female    DOB: 08/09/1924, 85 y.o.   MRN: [id number]  No chief complaint on file.   HPI Patient is in today for annual preventative exam. No recent febrile illness or hospitalizations. No polyuria or polyipsia. Is trying to maintain  A heart healthy diet. Denies CP/palp/SOB/HA/congestion/fevers/GI or GU c/o. Taking meds as prescribed  Past Medical History:  Diagnosis Date  . Acute pulmonary embolism ([place]) 03/23/2018  . Aortic atherosclerosis ([place]) 03/23/2018  . Arthritis   . CTS (carpal tunnel syndrome) 06/18/2015  . Depression 4/03  . Diverticulosis   . Diverticulosis of colon without hemorrhage 10/07/2011  . Elevated blood pressure 07/24/2015  . GERD (gastroesophageal reflux disease)   . Granuloma of skin    RLL  . H/O measles   . H/O mumps   . Hearing loss   . History of chicken pox   . Hyperlipidemia   . Hypertension 07/24/2015  . Hypothyroidism   . Incontinence in female 02/06/2015  . Leg DVT (deep venous thromboembolism), acute, left ([place]) 03/23/2018  . Lumbar herniated disc   . Mediastinal adenopathy 03/23/2018  . Medicare annual wellness visit, subsequent 05/30/2016  . Nocturia 06/18/2015  . Osteopenia   . Tinnitus 07/24/2015  . Vitamin D deficiency     Past Surgical History:  Procedure Laterality Date  . ABDOMINAL HYSTERECTOMY  1980  . APPENDECTOMY    . BREAST SURGERY     L breast cyst  . CATARACT EXTRACTION  2010  . [place]  . KNEE SURGERY  2004   left knee  . TONSILLECTOMY AND ADENOIDECTOMY  1939    Family History  Problem Relation Age of Onset  . Breast cancer Mother        metastatic disease  . Heart disease Mother        Atrial fibrillation  . Kidney disease Father        Bright's disease  . Cancer Maternal Grandfather        prostate  . Obesity Daughter   . Thyroid disease Son   . Hyperlipidemia Son     Social History   Socioeconomic History  . Marital status:  Widowed    Spouse name: Not on file  . Number of children: 3  . Years of education: Not on file  . Highest education level: Not on file  Occupational History  . Not on file  Social Needs  . Financial resource strain: Not on file  . Food insecurity:    Worry: Not on file    Inability: Not on file  . Transportation needs:    Medical: Not on file    Non-medical: Not on file  Tobacco Use  . Smoking status: Former Smoker    Packs/day: 0.50    Years: 40.00    Pack years: 20.00    Types: Cigarettes    Start date: 06/09/1940    Last attempt to quit: 04/23/1979    Years since quitting: 39.0  . Smokeless tobacco: Never Used  . Tobacco comment: quit smoking 40 years ago  Substance and Sexual Activity  . Alcohol use: Yes    Alcohol/week: 0.0 standard drinks    Comment: socially  . Drug use: No  . Sexual activity: Not Currently    Birth control/protection: Post-menopausal    Comment: moved to [place], widowed. no dietary restrictions  Lifestyle  . Physical activity:    Days per  week: Not on file    Minutes per session: Not on file  . Stress: Not on file  Relationships  . Social connections:    Talks on phone: Not on file    Gets together: Not on file    Attends religious service: Not on file    Active member of club or organization: Not on file    Attends meetings of clubs or organizations: Not on file    Relationship status: Not on file  . Intimate partner violence:    Fear of current or ex partner: Not on file    Emotionally abused: Not on file    Physically abused: Not on file    Forced sexual activity: Not on file  Other Topics Concern  . Not on file  Social History Narrative  . Not on file    Outpatient Medications Prior to Visit  Medication Sig Dispense Refill  . amLODipine (NORVASC) 5 MG tablet Take 1 tablet (5 mg total) by mouth daily. 30 tablet 0  . calcium-vitamin D (OSCAL WITH D) 500-200 MG-UNIT per tablet Take 1 tablet by mouth.    . levothyroxine  (SYNTHROID, LEVOTHROID) 88 MCG tablet Take 88 mcg by mouth daily before breakfast.    . polyethylene glycol (MIRALAX / GLYCOLAX) packet Take 17 g by mouth daily as needed for mild constipation. 14 each 0  . psyllium (METAMUCIL) 58.6 % packet Take 1 packet by mouth daily.    Marland Kitchen senna (SENOKOT) 8.6 MG tablet Take 2 tablets by mouth as needed for constipation.    Marland Kitchen ELIQUIS STARTER PACK (ELIQUIS STARTER PACK) 5 MG TABS Take as directed on package: start with two-5mg  tablets twice daily for 7 days. On day 8, switch to one-5mg  tablet twice daily. 1 each 0   No facility-administered medications prior to visit.     Allergies  Allergen Reactions  . Latex Itching  . Lipitor [Atorvastatin Calcium] Other (See Comments)    Myalgia   . Tape Itching    Review of Systems  Constitutional: Negative for fever and malaise/fatigue.  HENT: Negative for congestion.   Eyes: Negative for blurred vision.  Respiratory: Negative for shortness of breath.   Cardiovascular: Negative for chest pain, palpitations and leg swelling.  Gastrointestinal: Negative for abdominal pain, blood in stool and nausea.  Genitourinary: Negative for dysuria and frequency.  Musculoskeletal: Positive for back pain. Negative for falls.  Skin: Negative for rash.  Neurological: Negative for dizziness, loss of consciousness and headaches.  Endo/Heme/Allergies: Negative for environmental allergies.  Psychiatric/Behavioral: Negative for depression. The patient is not nervous/anxious.        Objective:    Physical Exam Vitals signs and nursing note reviewed.  Constitutional:      General: She is not in acute distress.    Appearance: She is well-developed.  HENT:     Head: Normocephalic and atraumatic.     Nose: Nose normal.  Eyes:     General:        Right eye: No discharge.        Left eye: No discharge.  Neck:     Musculoskeletal: Normal range of motion and neck supple.  Cardiovascular:     Rate and Rhythm: Normal rate and  regular rhythm.     Heart sounds: No murmur.  Pulmonary:     Effort: Pulmonary effort is normal.     Breath sounds: Normal breath sounds.  Abdominal:     General: Bowel sounds are normal.  Palpations: Abdomen is soft.     Tenderness: There is no abdominal tenderness.  Skin:    General: Skin is warm and dry.  Neurological:     Mental Status: She is alert and oriented to person, place, and time.     BP 122/78 (BP Location: Left Arm, Patient Position: Sitting, Cuff Size: Normal)   Pulse 69   Temp 97.8 F (36.6 C) (Oral)   Resp 18   Wt 148 lb 12.8 oz (67.5 kg)   SpO2 96%   BMI 24.76 kg/m  Wt Readings from Last 3 Encounters:  04/30/18 148 lb 12.8 oz (67.5 kg)  03/22/18 150 lb (68 kg)  01/16/18 158 lb 9.6 oz (71.9 kg)     Lab Results  Component Value Date   WBC 10.5 03/25/2018   HGB 14.1 03/25/2018   HCT 45.2 03/25/2018   PLT 273 03/25/2018   GLUCOSE 200 (H) 03/24/2018   CHOL 175 07/11/2017   TRIG 92.0 07/11/2017   HDL 47.50 07/11/2017   LDLCALC 109 (H) 07/11/2017   ALT 16 07/11/2017   AST 18 07/11/2017   NA 136 03/24/2018   K 3.9 03/24/2018   CL 103 03/24/2018   CREATININE 0.80 03/24/2018   BUN 13 03/24/2018   CO2 25 03/24/2018   TSH 4.06 04/30/2018   HGBA1C 6.1 04/30/2018   MICROALBUR 0.7 04/30/2018    Lab Results  Component Value Date   TSH 4.06 04/30/2018   Lab Results  Component Value Date   WBC 10.5 03/25/2018   HGB 14.1 03/25/2018   HCT 45.2 03/25/2018   MCV 92.8 03/25/2018   PLT 273 03/25/2018   Lab Results  Component Value Date   NA 136 03/24/2018   K 3.9 03/24/2018   CO2 25 03/24/2018   GLUCOSE 200 (H) 03/24/2018   BUN 13 03/24/2018   CREATININE 0.80 03/24/2018   BILITOT 0.4 07/11/2017   ALKPHOS 55 07/11/2017   AST 18 07/11/2017   ALT 16 07/11/2017   PROT 6.8 07/11/2017   ALBUMIN 4.0 07/11/2017   CALCIUM 9.6 03/24/2018   ANIONGAP 8 03/24/2018   GFR 86.21 07/11/2017   Lab Results  Component Value Date   CHOL 175 07/11/2017     Lab Results  Component Value Date   HDL 47.50 07/11/2017   Lab Results  Component Value Date   LDLCALC 109 (H) 07/11/2017   Lab Results  Component Value Date   TRIG 92.0 07/11/2017   Lab Results  Component Value Date   CHOLHDL 4 07/11/2017   Lab Results  Component Value Date   HGBA1C 6.1 04/30/2018       Assessment & Plan:   Problem List Items Addressed This Visit    Hypothyroidism   Osteopenia  Dexa done 05/27/2014 normal     Encouraged to get adequate exercise, calcium and vitamin d intake      Vitamin D deficiency    Supplement and monitor      Relevant Orders   VITAMIN D 25 Hydroxy (Vit-D Deficiency, Fractures) (Completed)   DJD (degenerative joint disease)    All through out the back but the thoracic spine. Encouraged moist heat and gentle stretching as tolerated. May try NSAIDs and prescription meds as directed and report if symptoms worsen or seek immediate care      Bradycardia   Essential hypertension - Primary    Denies CP/palp/SOB/HA/congestion/fevers/GI or GU c/o. Taking meds as prescribed      Relevant Medications   apixaban (ELIQUIS) 5 MG  TABS tablet   Other Relevant Orders   Ambulatory referral to Cardiology   TSH (Completed)   Urine Microalbumin w/creat. ratio (Completed)    Other Visit Diagnoses    Cardiac arrhythmia, unspecified cardiac arrhythmia type       Relevant Medications   apixaban (ELIQUIS) 5 MG TABS tablet   Other Relevant Orders   Ambulatory referral to Cardiology   Hyperglycemia       Relevant Orders   Hemoglobin A1c (Completed)   Urine Microalbumin w/creat. ratio (Completed)      I have discontinued Kylie Walters's ELIQUIS STARTER PACK. I am also having her start on apixaban. Additionally, I am having her maintain her calcium-vitamin D, psyllium, senna, levothyroxine, amLODipine, and polyethylene glycol.  Meds ordered this encounter  Medications  . apixaban (ELIQUIS) 5 MG TABS tablet    Sig: Take 1 tablet (5 mg  total) by mouth 2 (two) times daily.    Dispense:  60 tablet    Refill:  3     Penni Homans, MD

## 2018-05-03 NOTE — Assessment & Plan Note (Signed)
Supplement and monitor 

## 2018-05-03 NOTE — Assessment & Plan Note (Signed)
All through out the back but the thoracic spine. Encouraged moist heat and gentle stretching as tolerated. May try NSAIDs and prescription meds as directed and report if symptoms worsen or seek immediate care

## 2018-05-03 NOTE — Assessment & Plan Note (Signed)
Denies CP/palp/SOB/HA/congestion/fevers/GI or GU c/o. Taking meds as prescribed

## 2018-05-05 DIAGNOSIS — I82442 Acute embolism and thrombosis of left tibial vein: Secondary | ICD-10-CM | POA: Diagnosis not present

## 2018-05-05 DIAGNOSIS — R2689 Other abnormalities of gait and mobility: Secondary | ICD-10-CM | POA: Diagnosis not present

## 2018-05-05 DIAGNOSIS — M6281 Muscle weakness (generalized): Secondary | ICD-10-CM | POA: Diagnosis not present

## 2018-05-05 DIAGNOSIS — R2681 Unsteadiness on feet: Secondary | ICD-10-CM | POA: Diagnosis not present

## 2018-05-05 DIAGNOSIS — I519 Heart disease, unspecified: Secondary | ICD-10-CM | POA: Diagnosis not present

## 2018-05-05 DIAGNOSIS — I2699 Other pulmonary embolism without acute cor pulmonale: Secondary | ICD-10-CM | POA: Diagnosis not present

## 2018-05-07 ENCOUNTER — Other Ambulatory Visit: Payer: Self-pay | Admitting: Family Medicine

## 2018-05-07 DIAGNOSIS — M6281 Muscle weakness (generalized): Secondary | ICD-10-CM | POA: Diagnosis not present

## 2018-05-07 DIAGNOSIS — R2681 Unsteadiness on feet: Secondary | ICD-10-CM | POA: Diagnosis not present

## 2018-05-07 DIAGNOSIS — I82442 Acute embolism and thrombosis of left tibial vein: Secondary | ICD-10-CM | POA: Diagnosis not present

## 2018-05-07 DIAGNOSIS — I519 Heart disease, unspecified: Secondary | ICD-10-CM | POA: Diagnosis not present

## 2018-05-07 DIAGNOSIS — R2689 Other abnormalities of gait and mobility: Secondary | ICD-10-CM | POA: Diagnosis not present

## 2018-05-07 DIAGNOSIS — I2699 Other pulmonary embolism without acute cor pulmonale: Secondary | ICD-10-CM | POA: Diagnosis not present

## 2018-05-07 MED ORDER — AMLODIPINE BESYLATE 5 MG PO TABS: 5.0000 mg | ORAL_TABLET | Freq: Every day | ORAL | 1 refills | Status: AC

## 2018-05-07 NOTE — Telephone Encounter (Signed)
Copied from [place] [id number]. Topic: Quick Communication - Rx Refill/Question >> May 07, 2018 10:12 AM Andria Frames L wrote: Medication: amLODipine (NORVASC) 5 MG tablet [[id number]]  Pt states that she is completley out.   Has the patient contacted their pharmacy? Yes/no refills Preferred Pharmacy (with phone number or street name):[facility], [place] - [address] [phone] (Phone) [phone] (Fax)   Agent: Please be advised that RX refills may take up to 3 business days. We ask that you follow-up with your pharmacy.

## 2018-05-07 NOTE — Telephone Encounter (Signed)
Rx sent 

## 2018-05-08 DIAGNOSIS — I519 Heart disease, unspecified: Secondary | ICD-10-CM | POA: Diagnosis not present

## 2018-05-08 DIAGNOSIS — I82442 Acute embolism and thrombosis of left tibial vein: Secondary | ICD-10-CM | POA: Diagnosis not present

## 2018-05-08 DIAGNOSIS — I2699 Other pulmonary embolism without acute cor pulmonale: Secondary | ICD-10-CM | POA: Diagnosis not present

## 2018-05-08 DIAGNOSIS — R2681 Unsteadiness on feet: Secondary | ICD-10-CM | POA: Diagnosis not present

## 2018-05-08 DIAGNOSIS — R2689 Other abnormalities of gait and mobility: Secondary | ICD-10-CM | POA: Diagnosis not present

## 2018-05-08 DIAGNOSIS — M6281 Muscle weakness (generalized): Secondary | ICD-10-CM | POA: Diagnosis not present

## 2018-05-12 ENCOUNTER — Encounter: Payer: Self-pay | Admitting: Cardiology

## 2018-05-12 ENCOUNTER — Ambulatory Visit (INDEPENDENT_AMBULATORY_CARE_PROVIDER_SITE_OTHER): Payer: Medicare Other | Admitting: Cardiology

## 2018-05-12 ENCOUNTER — Ambulatory Visit (INDEPENDENT_AMBULATORY_CARE_PROVIDER_SITE_OTHER): Payer: Medicare Other

## 2018-05-12 VITALS — BP 124/78 | Ht 65.0 in | Wt 149.0 lb

## 2018-05-12 DIAGNOSIS — R001 Bradycardia, unspecified: Secondary | ICD-10-CM | POA: Diagnosis not present

## 2018-05-12 DIAGNOSIS — R002 Palpitations: Secondary | ICD-10-CM

## 2018-05-12 DIAGNOSIS — R0609 Other forms of dyspnea: Secondary | ICD-10-CM

## 2018-05-12 NOTE — Patient Instructions (Signed)
Medication Instructions:  Your Physician recommend you continue on your current medication as directed.    If you need a refill on your cardiac medications before your next appointment, please call your pharmacy.   Lab work: None  Testing/Procedures: Our physician has recommended that you wear an 7  DAY ZIO-PATCH monitor. The Zio patch cardiac monitor continuously records heart rhythm data for up to 14 days, this is for patients being evaluated for multiple types heart rhythms. For the first 24 hours post application, please avoid getting the Zio monitor wet in the shower or by excessive sweating during exercise. After that, feel free to carry on with regular activities. Keep soaps and lotions away from the ZIO XT Patch.        Follow-Up: At [facility], you and your health needs are our priority.  As part of our continuing mission to provide you with exceptional heart care, we have created designated Provider Care Teams.  These Care Teams include your primary Cardiologist (physician) and Advanced Practice Providers (APPs -  Physician Assistants and Nurse Practitioners) who all work together to provide you with the care you need, when you need it. You will need a follow up appointment in 1 years.  Please call our office 2 months in advance to schedule this appointment.  You may see Dr. Harrell Gave or one of the following Advanced Practice Providers on your designated Care Team:   Rosaria Ferries, PA-C . Jory Sims, DNP, ANP

## 2018-05-12 NOTE — Progress Notes (Signed)
Cardiology Office Note:    Date:  05/12/2018   ID:  BRYNNAN RODENBAUGH, DOB 1924/05/26, MRN [id number]  PCP:  Mosie Lukes, MD  Cardiologist:  Buford Dresser, MD PhD  Referring MD: Mosie Lukes, MD   CC: palpitations, shortness of breath  History of Present Illness:    Kylie Walters is a 85 y.o. female with a hx of hypertension, history of DVT/PE, aortic atherosclerosis, hyperlipidemia who is seen as a new consult to me (previously seen by Dr. Stanford Breed in 2017-2018) at the request of Mosie Lukes, MD for the evaluation and management of abnormal rhythm.  ECG 03/22/18: SR, ventricular trigeminy, incomplete RBBB. Variable baseline, difficult to interpret ST segments ECG: 01/15/17 NSR  Per review of notes, had noted to be bradycardic in the past during vital checks at PCP office. On cardiology follow up, she was noted to be asymptomatic with HR in the 60s. Her hypertension and hyperlipidemia have been managed by primary care since being seen by cardiology. She also notes history of dyspnea, and prior echo did not reveal significant abnormalities.  She was recently discharged on 03/25/18 after being diagnosed with DVT/PE. She has been on apixaban for this. She noted gradually increasing shortness of breath, especially with activity.This and palpitations led to evaluation, where DVT/PE was diagnosed.   In the middle of the night, feels like her heart beats quickly. Has been intermittent for several weeks. No clear triggers. Thinks it has decreased in frequency since leaving the hospital. Never painful in her chest. Not occurring every day, but she notes it more on days when she pushes herself. No syncope, LE edema, PND, orthopnea, or weight changes.   Past Medical History:  Diagnosis Date  . Acute pulmonary embolism ([place]) 03/23/2018  . Aortic atherosclerosis ([place]) 03/23/2018  . Arthritis   . CTS (carpal tunnel syndrome) 06/18/2015  . Depression 4/03  . Diverticulosis   .  Diverticulosis of colon without hemorrhage 10/07/2011  . Elevated blood pressure 07/24/2015  . GERD (gastroesophageal reflux disease)   . Granuloma of skin    RLL  . H/O measles   . H/O mumps   . Hearing loss   . History of chicken pox   . Hyperlipidemia   . Hypertension 07/24/2015  . Hypothyroidism   . Incontinence in female 02/06/2015  . Leg DVT (deep venous thromboembolism), acute, left ([place]) 03/23/2018  . Lumbar herniated disc   . Mediastinal adenopathy 03/23/2018  . Medicare annual wellness visit, subsequent 05/30/2016  . Nocturia 06/18/2015  . Osteopenia   . Tinnitus 07/24/2015  . Vitamin D deficiency     Past Surgical History:  Procedure Laterality Date  . ABDOMINAL HYSTERECTOMY  1980  . APPENDECTOMY    . BREAST SURGERY     L breast cyst  . CATARACT EXTRACTION  2010  . [place]  . KNEE SURGERY  2004   left knee  . TONSILLECTOMY AND ADENOIDECTOMY  1939    Current Medications: Current Outpatient Medications on File Prior to Visit  Medication Sig  . amLODipine (NORVASC) 5 MG tablet TAKE ONE (1) TABLET BY MOUTH EACH DAY  . amLODipine (NORVASC) 5 MG tablet Take 1 tablet (5 mg total) by mouth daily.  Marland Kitchen apixaban (ELIQUIS) 5 MG TABS tablet Take 1 tablet (5 mg total) by mouth 2 (two) times daily.  . calcium-vitamin D (OSCAL WITH D) 500-200 MG-UNIT per tablet Take 1 tablet by mouth.  . levothyroxine (SYNTHROID,  LEVOTHROID) 88 MCG tablet Take 88 mcg by mouth daily before breakfast.  . polyethylene glycol (MIRALAX / GLYCOLAX) packet Take 17 g by mouth daily as needed for mild constipation.  . psyllium (METAMUCIL) 58.6 % packet Take 1 packet by mouth daily.  Marland Kitchen senna (SENOKOT) 8.6 MG tablet Take 2 tablets by mouth as needed for constipation.   No current facility-administered medications on file prior to visit.      Allergies:   Latex; Lipitor [atorvastatin calcium]; and Tape   Social History   Socioeconomic History  . Marital status:  Widowed    Spouse name: Not on file  . Number of children: 3  . Years of education: Not on file  . Highest education level: Not on file  Occupational History  . Not on file  Social Needs  . Financial resource strain: Not on file  . Food insecurity:    Worry: Not on file    Inability: Not on file  . Transportation needs:    Medical: Not on file    Non-medical: Not on file  Tobacco Use  . Smoking status: Former Smoker    Packs/day: 0.50    Years: 40.00    Pack years: 20.00    Types: Cigarettes    Start date: 06/09/1940    Last attempt to quit: 04/23/1979    Years since quitting: 39.0  . Smokeless tobacco: Never Used  . Tobacco comment: quit smoking 40 years ago  Substance and Sexual Activity  . Alcohol use: Yes    Alcohol/week: 0.0 standard drinks    Comment: socially  . Drug use: No  . Sexual activity: Not Currently    Birth control/protection: Post-menopausal    Comment: moved to [place], widowed. no dietary restrictions  Lifestyle  . Physical activity:    Days per week: Not on file    Minutes per session: Not on file  . Stress: Not on file  Relationships  . Social connections:    Talks on phone: Not on file    Gets together: Not on file    Attends religious service: Not on file    Active member of club or organization: Not on file    Attends meetings of clubs or organizations: Not on file    Relationship status: Not on file  Other Topics Concern  . Not on file  Social History Narrative  . Not on file     Family History: The patient's family history includes Breast cancer in her mother; Cancer in her maternal grandfather; Heart disease in her mother; Hyperlipidemia in her son; Kidney disease in her father; Obesity in her daughter; Thyroid disease in her son.  ROS:   Please see the history of present illness.  Additional pertinent ROS:  Constitutional: Negative for chills, fever, night sweats, unintentional weight loss  HENT: Negative for ear pain and  hearing loss.   Eyes: Negative for loss of vision and eye pain.  Respiratory: Positive for dyspnea on exertio. Negative for cough, sputum, shortness of breath at rest, wheezing.   Cardiovascular: Positive for palpitations. Negative for chest pain, PND, orthopnea, lower extremity edema and claudication.  Gastrointestinal: Negative for abdominal pain, melena, and hematochezia.  Genitourinary: Negative for dysuria and hematuria.  Musculoskeletal: Negative for falls and myalgias.  Skin: Negative for itching and rash.  Neurological: Negative for focal weakness, focal sensory changes and loss of consciousness.  Endo/Heme/Allergies: Does bruise/bleed easily.    EKGs/Labs/Other Studies Reviewed:    The following studies were reviewed  today: Echo 03/23/18 - Left ventricle: The cavity size was normal. Systolic function was   normal. The estimated ejection fraction was in the range of 55%   to 60%. Wall motion was normal; there were no regional wall   motion abnormalities. Doppler parameters are consistent with   abnormal left ventricular relaxation (grade 1 diastolic   dysfunction). - Aortic valve: There was trivial regurgitation. - Mitral valve: There was mild regurgitation. - Right ventricle: Systolic function was moderately reduced.   McConnell&'s sign is seen, consistent with at least a moderate to   large pulmonary embolism. - Right atrium: The atrium was mildly dilated. - Pulmonary arteries: PA peak pressure: 31 mm Hg (S).  EKG:  EKG is personally reviewed.  The ekg ordered today demonstrates NSR, poor R wave progression  Recent Labs: 07/11/2017: ALT 16 03/22/2018: B Natriuretic Peptide 403.2 03/24/2018: BUN 13; Creatinine, Ser 0.80; Magnesium 2.0; Potassium 3.9; Sodium 136 03/25/2018: Hemoglobin 14.1; Platelets 273 04/30/2018: TSH 4.06  Recent Lipid Panel    Component Value Date/Time   CHOL 175 07/11/2017 1026   TRIG 92.0 07/11/2017 1026   HDL 47.50 07/11/2017 1026   CHOLHDL 4  07/11/2017 1026   VLDL 18.4 07/11/2017 1026   LDLCALC 109 (H) 07/11/2017 1026    Physical Exam:    VS:  BP 124/78 (BP Location: Right Arm, Patient Position: Supine, Cuff Size: Normal)   Ht 5\' 5"  (1.651 m)   Wt 149 lb 0.6 oz (67.6 kg)   BMI 24.80 kg/m     Wt Readings from Last 3 Encounters:  05/12/18 149 lb 0.6 oz (67.6 kg)  04/30/18 148 lb 12.8 oz (67.5 kg)  03/22/18 150 lb (68 kg)     GEN: Well nourished, well developed in no acute distress HEENT: Normal NECK: No JVD; No carotid bruits LYMPHATICS: No lymphadenopathy CARDIAC: regular rhythm with frequent early beats, normal S1 and S2, no murmurs, rubs, gallops. Radial and DP pulses 2+ bilaterally. RESPIRATORY:  Clear to auscultation without rales, wheezing or rhonchi  ABDOMEN: Soft, non-tender, non-distended MUSCULOSKELETAL:  No edema; No deformity  SKIN: Warm and dry NEUROLOGIC:  Alert and oriented x 3 PSYCHIATRIC:  Normal affect   ASSESSMENT:    1. Palpitation   2. Bradycardia   3. Dyspnea on exertion    PLAN:    1. Palpitations: prior ECG with ventricular trigeminy, and while her ECG was NSR, she did have frequent early beats on exam -7 day Zio to evaluate rhythm -has already had recent echo -no high risk features noted such as syncope -possible that prior bradycardia on vitals readings was actually due to ectopy -overall rhythm/burden to determine further treatment needs  2. Dyspnea on exertion: recent PE. Echo without clear etiology from a cardiac standpoint. Euvolemic on exam.   -suspect 2/2 recent PE. Continue recovery  Plan for follow up: 1 year or sooner PRN  TIME SPENT WITH PATIENT: >25 minutes of direct patient care. More than 50% of that time was spent on coordination of care and counseling regarding evaluation and management of palpitations.  Buford Dresser, MD, PhD [place]  [facility]   Medication Adjustments/Labs and Tests Ordered: Current medicines are reviewed at length with  the patient today.  Concerns regarding medicines are outlined above.  Orders Placed This Encounter  Procedures  . LONG TERM MONITOR (3-14 DAYS)  . EKG 12-Lead   No orders of the defined types were placed in this encounter.   Patient Instructions  Medication Instructions:  Your Physician  recommend you continue on your current medication as directed.    If you need a refill on your cardiac medications before your next appointment, please call your pharmacy.   Lab work: None  Testing/Procedures: Our physician has recommended that you wear an 7  DAY ZIO-PATCH monitor. The Zio patch cardiac monitor continuously records heart rhythm data for up to 14 days, this is for patients being evaluated for multiple types heart rhythms. For the first 24 hours post application, please avoid getting the Zio monitor wet in the shower or by excessive sweating during exercise. After that, feel free to carry on with regular activities. Keep soaps and lotions away from the ZIO XT Patch.        Follow-Up: At [facility], you and your health needs are our priority.  As part of our continuing mission to provide you with exceptional heart care, we have created designated Provider Care Teams.  These Care Teams include your primary Cardiologist (physician) and Advanced Practice Providers (APPs -  Physician Assistants and Nurse Practitioners) who all work together to provide you with the care you need, when you need it. You will need a follow up appointment in 1 years.  Please call our office 2 months in advance to schedule this appointment.  You may see Dr. Harrell Gave or one of the following Advanced Practice Providers on your designated Care Team:   Rosaria Ferries, PA-C . Jory Sims, DNP, ANP      Signed, Buford Dresser, MD PhD 05/12/2018 3:13 PM    [place]

## 2018-05-19 DIAGNOSIS — R002 Palpitations: Secondary | ICD-10-CM

## 2018-05-26 DIAGNOSIS — R002 Palpitations: Secondary | ICD-10-CM | POA: Diagnosis not present

## 2018-06-16 DIAGNOSIS — H43393 Other vitreous opacities, bilateral: Secondary | ICD-10-CM | POA: Diagnosis not present

## 2018-06-16 DIAGNOSIS — H04123 Dry eye syndrome of bilateral lacrimal glands: Secondary | ICD-10-CM | POA: Diagnosis not present

## 2018-06-16 DIAGNOSIS — H52203 Unspecified astigmatism, bilateral: Secondary | ICD-10-CM | POA: Diagnosis not present

## 2018-06-16 DIAGNOSIS — H5203 Hypermetropia, bilateral: Secondary | ICD-10-CM | POA: Diagnosis not present

## 2018-06-16 DIAGNOSIS — H524 Presbyopia: Secondary | ICD-10-CM | POA: Diagnosis not present

## 2018-06-16 DIAGNOSIS — Z961 Presence of intraocular lens: Secondary | ICD-10-CM | POA: Diagnosis not present

## 2018-06-16 DIAGNOSIS — H18413 Arcus senilis, bilateral: Secondary | ICD-10-CM | POA: Diagnosis not present

## 2018-06-23 DIAGNOSIS — R0981 Nasal congestion: Secondary | ICD-10-CM | POA: Diagnosis not present

## 2018-06-23 DIAGNOSIS — J029 Acute pharyngitis, unspecified: Secondary | ICD-10-CM | POA: Diagnosis not present

## 2018-07-03 DIAGNOSIS — M79675 Pain in left toe(s): Secondary | ICD-10-CM | POA: Diagnosis not present

## 2018-07-03 DIAGNOSIS — M79674 Pain in right toe(s): Secondary | ICD-10-CM | POA: Diagnosis not present

## 2018-07-03 DIAGNOSIS — B351 Tinea unguium: Secondary | ICD-10-CM | POA: Diagnosis not present

## 2018-07-06 ENCOUNTER — Ambulatory Visit: Payer: Self-pay

## 2018-07-06 NOTE — Telephone Encounter (Signed)
Returned call to patient who states that she has had laryngitis for several months.  Her voice sounds hoarse.  She states that she went to the cliniclast week and was told it was sinus drainage. She denies sore throat.  Sinuses are full but she is able to blow and clear. She has mild ear pressure and pain. She states the phlegm is yellow. She has taken Claritin as prescribed at the clinic. She is also taking tylenol.  She has no fever.  States it was 43 today.Appointment scheduled per protocol.  Care advice read to patient. Pt verbalized understanding of all instructions.  Reason for Disposition . [1] Nasal discharge AND [2] present > 10 days  Answer Assessment - Initial Assessment Questions 1. LOCATION: "Where does it hurt?"      Just drainage 2. ONSET: "When did the sinus pain start?"  (e.g., hours, days)      Several months ago 3. SEVERITY: "How bad is the pain?"   (Scale 1-10; mild, moderate or severe)   - MILD (1-3): doesn't interfere with normal activities    - MODERATE (4-7): interferes with normal activities (e.g., work or school) or awakens from sleep   - SEVERE (8-10): excruciating pain and patient unable to do any normal activities        Mild back of throat 4. RECURRENT SYMPTOM: "Have you ever had sinus problems before?" If so, ask: "When was the last time?" and "What happened that time?"      Maybe years ago 5. NASAL CONGESTION: "Is the nose blocked?" If so, ask, "Can you open it or must you breathe through the mouth?"     clear 6. NASAL DISCHARGE: "Do you have discharge from your nose?" If so ask, "What color?"     Yellow discharge 7. FEVER: "Do you have a fever?" If so, ask: "What is it, how was it measured, and when did it start?"      No 98 8. OTHER SYMPTOMS: "Do you have any other symptoms?" (e.g., sore throat, cough, earache, difficulty breathing)     Earache was present not now 9. PREGNANCY: "Is there any chance you are pregnant?" "When was your last menstrual period?"    N/A  Protocols used: SINUS PAIN OR CONGESTION-A-AH

## 2018-07-07 ENCOUNTER — Ambulatory Visit: Payer: Medicare Other | Admitting: Family Medicine

## 2018-07-13 ENCOUNTER — Ambulatory Visit: Payer: Medicare Other | Admitting: Family Medicine

## 2018-07-13 ENCOUNTER — Ambulatory Visit: Payer: Medicare Other | Admitting: *Deleted

## 2018-07-17 ENCOUNTER — Ambulatory Visit: Payer: Medicare Other | Admitting: Family Medicine

## 2018-08-03 ENCOUNTER — Encounter (HOSPITAL_BASED_OUTPATIENT_CLINIC_OR_DEPARTMENT_OTHER): Payer: Self-pay

## 2018-08-03 ENCOUNTER — Ambulatory Visit (INDEPENDENT_AMBULATORY_CARE_PROVIDER_SITE_OTHER): Payer: Medicare Other | Admitting: Internal Medicine

## 2018-08-03 ENCOUNTER — Ambulatory Visit: Payer: Self-pay | Admitting: *Deleted

## 2018-08-03 ENCOUNTER — Emergency Department (HOSPITAL_BASED_OUTPATIENT_CLINIC_OR_DEPARTMENT_OTHER): Payer: Medicare Other

## 2018-08-03 ENCOUNTER — Other Ambulatory Visit: Payer: Self-pay

## 2018-08-03 ENCOUNTER — Emergency Department (HOSPITAL_BASED_OUTPATIENT_CLINIC_OR_DEPARTMENT_OTHER)
Admission: EM | Admit: 2018-08-03 | Discharge: 2018-08-03 | Disposition: A | Discharge status: Home / Self Care | Payer: Medicare Other | Attending: Emergency Medicine | Admitting: Emergency Medicine

## 2018-08-03 DIAGNOSIS — Z79899 Other long term (current) drug therapy: Secondary | ICD-10-CM | POA: Diagnosis not present

## 2018-08-03 DIAGNOSIS — Z7901 Long term (current) use of anticoagulants: Secondary | ICD-10-CM | POA: Insufficient documentation

## 2018-08-03 DIAGNOSIS — Z87891 Personal history of nicotine dependence: Secondary | ICD-10-CM | POA: Insufficient documentation

## 2018-08-03 DIAGNOSIS — R002 Palpitations: Secondary | ICD-10-CM | POA: Diagnosis not present

## 2018-08-03 DIAGNOSIS — R0602 Shortness of breath: Secondary | ICD-10-CM

## 2018-08-03 DIAGNOSIS — R918 Other nonspecific abnormal finding of lung field: Secondary | ICD-10-CM | POA: Insufficient documentation

## 2018-08-03 DIAGNOSIS — R05 Cough: Secondary | ICD-10-CM | POA: Diagnosis not present

## 2018-08-03 DIAGNOSIS — E039 Hypothyroidism, unspecified: Secondary | ICD-10-CM | POA: Diagnosis not present

## 2018-08-03 DIAGNOSIS — Z91040 Latex allergy status: Secondary | ICD-10-CM | POA: Insufficient documentation

## 2018-08-03 DIAGNOSIS — R490 Dysphonia: Secondary | ICD-10-CM | POA: Diagnosis not present

## 2018-08-03 DIAGNOSIS — I10 Essential (primary) hypertension: Secondary | ICD-10-CM | POA: Insufficient documentation

## 2018-08-03 LAB — CBC WITH DIFFERENTIAL/PLATELET
Abs Immature Granulocytes: 0.03 10*3/uL (ref 0.00–0.07)
Basophils Absolute: 0 10*3/uL (ref 0.0–0.1)
Basophils Relative: 0 %
Eosinophils Absolute: 0.1 10*3/uL (ref 0.0–0.5)
Eosinophils Relative: 1 %
HCT: 42.2 % (ref 36.0–46.0)
Hemoglobin: 12.9 g/dL (ref 12.0–15.0)
Immature Granulocytes: 0 %
Lymphocytes Relative: 24 %
Lymphs Abs: 1.8 10*3/uL (ref 0.7–4.0)
MCH: 27.9 pg (ref 26.0–34.0)
MCHC: 30.6 g/dL (ref 30.0–36.0)
MCV: 91.1 fL (ref 80.0–100.0)
Monocytes Absolute: 1 10*3/uL (ref 0.1–1.0)
Monocytes Relative: 13 %
Neutro Abs: 4.6 10*3/uL (ref 1.7–7.7)
Neutrophils Relative %: 62 %
Platelets: 293 10*3/uL (ref 150–400)
RBC: 4.63 MIL/uL (ref 3.87–5.11)
RDW: 14.4 % (ref 11.5–15.5)
WBC: 7.5 10*3/uL (ref 4.0–10.5)
nRBC: 0 % (ref 0.0–0.2)

## 2018-08-03 LAB — BASIC METABOLIC PANEL
Anion gap: 8 (ref 5–15)
BUN: 11 mg/dL (ref 8–23)
CO2: 25 mmol/L (ref 22–32)
Calcium: 9.3 mg/dL (ref 8.9–10.3)
Chloride: 105 mmol/L (ref 98–111)
Creatinine, Ser: 0.56 mg/dL (ref 0.44–1.00)
GFR calc Af Amer: >60 mL/min (ref >60)
GFR calc non Af Amer: >60 mL/min (ref >60)
Glucose, Bld: 118 mg/dL — ABNORMAL HIGH (ref 70–99)
Potassium: 3.2 mmol/L — ABNORMAL LOW (ref 3.5–5.1)
Sodium: 138 mmol/L (ref 135–145)

## 2018-08-03 MED ORDER — DOXYCYCLINE HYCLATE 100 MG PO CAPS: 100.0000 mg | ORAL_CAPSULE | Freq: Two times a day (BID) | ORAL | 0 refills | Status: AC

## 2018-08-03 MED ORDER — LORATADINE 10 MG PO TABS: 10.0000 mg | ORAL_TABLET | Freq: Every day | ORAL | 1 refills | Status: AC

## 2018-08-03 NOTE — Telephone Encounter (Signed)
Hospitalized in December for blood clots. Was placed on Eliquis 5 mg tabs twice daily.Began feeling short of breath on exertion only and has a productive cough with her temperature at 98.8 oral today with tylenol but says this is higher than her normal. Has been up to 99.9 at hs without tylenol. Phlegm is stringy and tinged colored.  She has notable SOB with our conversation. Also feels her heart  Is beating irregular and her home b/p  Monitor reads her rhythm is irregular. This she noticed has been intermittent since being out of the hospital in December. Stated occasionally her chest feels tight, it does today between her breast even while resting. B/p 132/84 hr 82. Tried to reach Dr. Judeth Cornfield office but no luck. Explained the Dr. Lynnda Child want to have a virtual visit with her, possibly today. No travels/no known contacts. Routing note to PCP. RN in residential  Clinic no name but # is [phone].  Reason for Disposition . [1] MILD difficulty breathing (e.g., minimal/no SOB at rest, SOB with walking, pulse <100) AND [2] NEW-onset or WORSE than normal . [1] MODERATE difficulty breathing (e.g., speaks in phrases, SOB even at rest, pulse 100-120) AND [2] NEW-onset or WORSE than normal  Answer Assessment - Initial Assessment Questions 1. RESPIRATORY STATUS: "Describe your breathing?" (e.g., wheezing, shortness of breath, unable to speak, severe coughing)      Feels SOB with exertion 2. ONSET: "When did this breathing problem begin?"      Two weeks ago.  3. PATTERN "Does the difficult breathing come and go, or has it been constant since it started?"      Constantly while she is exerting herself. 4. SEVERITY: "How bad is your breathing?" (e.g., mild, moderate, severe)    - MILD: No SOB at rest, mild SOB with walking, speaks normally in sentences, can lay down, no retractions, pulse < 100.    - MODERATE: SOB at rest, SOB with minimal exertion and prefers to sit, cannot lie down flat, speaks in phrases,  mild retractions, audible wheezing, pulse 100-120.    - SEVERE: Very SOB at rest, speaks in single words, struggling to breathe, sitting hunched forward, retractions, pulse > 120      Moderate 5. RECURRENT SYMPTOM: "Have you had difficulty breathing before?" If so, ask: "When was the last time?" and "What happened that time?"      no 6. CARDIAC HISTORY: "Do you have any history of heart disease?" (e.g., heart attack, angina, bypass surgery, angioplasty)      no 7. LUNG HISTORY: "Do you have any history of lung disease?"  (e.g., pulmonary embolus, asthma, emphysema)     Blood clots recently in lungs and leg 8. CAUSE: "What do you think is causing the breathing problem?"      unsure 9. OTHER SYMPTOMS: "Do you have any other symptoms? (e.g., dizziness, runny nose, cough, chest pain, fever)     Post nasal drip pharmacist ordered her coricidin. 10. PREGNANCY: "Is there any chance you are pregnant?" "When was your last menstrual period?"       no 11. TRAVEL: "Have you traveled out of the country in the last month?" (e.g., travel history, exposures)      no  Protocols used: BREATHING DIFFICULTY-A-AH

## 2018-08-03 NOTE — ED Provider Notes (Signed)
[facility] EMERGENCY DEPARTMENT Provider Note   CSN: [id number] Arrival date & time: 08/03/18  1729    History   Chief Complaint Chief Complaint  Patient presents with  . Cough    HPI Kylie Walters is a 85 y.o. female.     Patient is a 85 year old female with history of hypertension, GERD.  She presents today with complaints of hoarse voice, cough, and shortness of breath.  According to the patient and daughter at bedside, this has progressed over the past 8 weeks.  This week, the patient has noticed that when she ambulates she feels like her heart races.  She has a history of chronic A. fib.  She denies any chest pain or fever at home.  Her temperature today is 98, however the patient tells me her temperature is normally 97, so this implies a fever.  The history is provided by the patient.  Cough  Cough characteristics:  Non-productive Severity:  Mild Onset quality:  Gradual Duration:  8 weeks Timing:  Constant Progression:  Worsening Chronicity:  New Relieved by:  Nothing Worsened by:  Nothing Ineffective treatments:  None tried   Past Medical History:  Diagnosis Date  . Acute pulmonary embolism ([place]) 03/23/2018  . Aortic atherosclerosis ([place]) 03/23/2018  . Arthritis   . CTS (carpal tunnel syndrome) 06/18/2015  . Depression 4/03  . Diverticulosis   . Diverticulosis of colon without hemorrhage 10/07/2011  . Elevated blood pressure 07/24/2015  . GERD (gastroesophageal reflux disease)   . Granuloma of skin    RLL  . H/O measles   . H/O mumps   . Hearing loss   . History of chicken pox   . Hyperlipidemia   . Hypertension 07/24/2015  . Hypothyroidism   . Incontinence in female 02/06/2015  . Leg DVT (deep venous thromboembolism), acute, left ([place]) 03/23/2018  . Lumbar herniated disc   . Mediastinal adenopathy 03/23/2018  . Medicare annual wellness visit, subsequent 05/30/2016  . Nocturia 06/18/2015  . Osteopenia   . Tinnitus 07/24/2015  . Vitamin D deficiency      Patient Active Problem List   Diagnosis Date Noted  . Acute pulmonary embolism ([place]) 03/23/2018  . Leg DVT (deep venous thromboembolism), acute, left ([place]) 03/23/2018  . Mediastinal adenopathy 03/23/2018  . Aortic atherosclerosis ([place]) 03/23/2018  . Essential hypertension 03/22/2018  . Anxiety 07/13/2017  . Bradycardia 12/01/2016  . Medicare annual wellness visit, subsequent 05/30/2016  . Recurrent falls 03/24/2016  . Tinnitus 07/24/2015  . CTS (carpal tunnel syndrome) 06/18/2015  . Nocturia 06/18/2015  . Dyspnea 06/14/2015  . Incontinence in female 02/06/2015  . History of chicken pox   . Hyperlipidemia, mixed 05/24/2014  . Osteoarthritis 05/29/2013  . 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 Tobacco use disorder  Quit smoking 35 years ago  11/16/2012  . Calcified granuloma of lung ([place]) 11/16/2012  . Sinus bradycardia on ECG 11/16/2012  . TMJ arthralgia 03/10/2012  . Hypothyroidism 10/07/2011  . Osteopenia  Dexa done 05/27/2014 normal  10/07/2011  . Diverticulosis of colon without hemorrhage 10/07/2011  . Vitamin D deficiency 10/07/2011  . GERD (gastroesophageal reflux disease) 10/07/2011  . Hearing loss 10/07/2011  . DJD (degenerative joint disease) 10/07/2011  . History of depression 10/07/2011  . Disc herniation 10/07/2011  . H/O hysterectomy with oophorectomy 10/07/2011  . Family history of breast cancer in first degree relative 10/07/2011  . Thyroid cyst 10/07/2011    Past Surgical History:  Procedure Laterality Date  . ABDOMINAL HYSTERECTOMY  1980  . APPENDECTOMY    . BREAST SURGERY     L breast cyst  . CATARACT EXTRACTION  2010  . [place]  . KNEE SURGERY  2004   left knee  . TONSILLECTOMY AND ADENOIDECTOMY  1939     OB History    Gravida  7   Para  3   Term      Preterm      AB  4   Living        SAB  4   TAB      Ectopic      Multiple      Live Births               Home  Medications    Prior to Admission medications   Medication Sig Start Date End Date Taking? Authorizing Provider  amLODipine (NORVASC) 5 MG tablet Take 1 tablet (5 mg total) by mouth daily. 05/07/18   Mosie Lukes, MD  apixaban (ELIQUIS) 5 MG TABS tablet Take 1 tablet (5 mg total) by mouth 2 (two) times daily. 04/30/18   Mosie Lukes, MD  calcium-vitamin D (OSCAL WITH D) 500-200 MG-UNIT per tablet Take 1 tablet by mouth.    [provider]  levothyroxine (SYNTHROID, LEVOTHROID) 88 MCG tablet Take 88 mcg by mouth daily before breakfast.    [provider]  polyethylene glycol (MIRALAX / GLYCOLAX) packet Take 17 g by mouth daily as needed for mild constipation. 03/25/18   Lavina Hamman, MD  psyllium (METAMUCIL) 58.6 % packet Take 1 packet by mouth daily.    [provider]  senna (SENOKOT) 8.6 MG tablet Take 2 tablets by mouth as needed for constipation.    [provider]    Family History Family History  Problem Relation Age of Onset  . Breast cancer Mother        metastatic disease  . Heart disease Mother        Atrial fibrillation  . Kidney disease Father        Bright's disease  . Cancer Maternal Grandfather        prostate  . Obesity Daughter   . Thyroid disease Son   . Hyperlipidemia Son     Social History Social History   Tobacco Use  . Smoking status: Former Smoker    Packs/day: 0.50    Years: 40.00    Pack years: 20.00    Types: Cigarettes    Start date: 06/09/1940    Last attempt to quit: 04/23/1979    Years since quitting: 39.3  . Smokeless tobacco: Never Used  . Tobacco comment: quit smoking 40 years ago  Substance Use Topics  . Alcohol use: Yes    Alcohol/week: 0.0 standard drinks    Comment: socially  . Drug use: No     Allergies   Latex; Lipitor [atorvastatin calcium]; and Tape   Review of Systems Review of Systems  Respiratory: Positive for cough.   All other systems reviewed and are negative.    Physical  Exam Updated Vital Signs BP 124/81 (BP Location: Left Arm)   Pulse 89   Temp 98 F (36.7 C) (Oral)   Resp 18   Ht 5\' 3"  (1.6 m)   Wt 67.6 kg   SpO2 97%   BMI 26.39 kg/m   Physical Exam Vitals signs and nursing note reviewed.  Constitutional:      General: She is  not in acute distress.    Appearance: She is well-developed. She is not diaphoretic.  HENT:     Head: Normocephalic and atraumatic.  Neck:     Musculoskeletal: Normal range of motion and neck supple. No neck rigidity or muscular tenderness.  Cardiovascular:     Rate and Rhythm: Normal rate. Rhythm irregular.     Heart sounds: No murmur. No friction rub. No gallop.   Pulmonary:     Effort: Pulmonary effort is normal. No respiratory distress.     Breath sounds: Normal breath sounds. No wheezing.  Abdominal:     General: Bowel sounds are normal. There is no distension.     Palpations: Abdomen is soft.     Tenderness: There is no abdominal tenderness.  Musculoskeletal: Normal range of motion.     Right lower leg: No edema.     Left lower leg: No edema.  Lymphadenopathy:     Cervical: No cervical adenopathy.  Skin:    General: Skin is warm and dry.  Neurological:     Mental Status: She is alert and oriented to person, place, and time.      ED Treatments / Results  Labs (all labs ordered are listed, but only abnormal results are displayed) Labs Reviewed  BASIC METABOLIC PANEL  CBC WITH DIFFERENTIAL/PLATELET    EKG EKG Interpretation  Date/Time:  Monday August 03 2018 18:15:25 EDT Ventricular Rate:  84 PR Interval:    QRS Duration: 104 QT Interval:  380 QTC Calculation: 450 R Axis:   34 Text Interpretation:  Sinus rhythm Multiple premature complexes, vent & supraven Minimal ST depression Baseline wander in lead(s) V1 Confirmed by Veryl Speak [phone]) on 08/03/2018 7:11:14 PM   Radiology No results found.  Procedures Procedures (including critical care time)  Medications Ordered in ED  Medications - No data to display   Initial Impression / Assessment and Plan / ED Course  I have reviewed the triage vital signs and the nursing notes.  Pertinent labs & imaging results that were available during my care of the patient were reviewed by me and considered in my medical decision making (see chart for details).  Patient is a 85 year old female presenting with a 44-month history of cough and hoarse voice.  She is afebrile here in the ER.  Her chest x-ray shows the possibility of a mass in the right upper lobe and a CT scan was recommended.  This was obtained and confirms a 5 cm apparent mass in the right upper lobe.  Patient was informed of these results and it sounds as though she does not want any heroic measures taken.  I will give the number for the oncology office with whom she can follow-up to discuss her options.  Patient also complaining of cough.  She will be given antibiotics and allergy medicine.  She has been on Claritin in the past.  Final Clinical Impressions(s) / ED Diagnoses   Final diagnoses:  None    ED Discharge Orders    None       Veryl Speak, MD 08/03/18 2037

## 2018-08-03 NOTE — Progress Notes (Signed)
Subjective:    Patient ID: Sinclair Ship, female    DOB: 1925-01-04, 85 y.o.   MRN: [id number]  DOS:  08/03/2018 Type of visit - description: Virtual Visit via Telephone Note  I connected with@ on 08/03/18 at  3:40 PM EDT by telephone and verified that I am speaking with the correct person using two identifiers.  THIS ENCOUNTER IS A VIRTUAL VISIT DUE TO COVID-19 - PATIENT WAS NOT SEEN IN THE OFFICE. PATIENT HAS CONSENTED TO VIRTUAL VISIT / TELEMEDICINE VISIT   Location of patient: home  Location of provider: office  I discussed the limitations, risks, security and privacy concerns of performing an evaluation and management service by telephone and the availability of in person appointments. I also discussed with the patient that there may be a patient responsible charge related to this service. The patient expressed understanding and agreed to proceed.   History of Present Illness: Acute evaluation The patient was discharged from the hospital after PE/DVT, diagnosed December 2019. Since then she was doing okay but she noted that in the last few weeks she is going "downhill". She reports that she is not very active but when she is she gets short of breath. Also has on and off palpitations associated with difficulty breathing.  In the last few days has noted slightly increased temperature as high as 99.9.  Review of Systems She admits to mild cough for the last couple of days. Denies chest pain Denies unusual aches or pains.  No chills. No edema.   Past Medical History:  Diagnosis Date  . Acute pulmonary embolism ([place]) 03/23/2018  . Aortic atherosclerosis ([place]) 03/23/2018  . Arthritis   . CTS (carpal tunnel syndrome) 06/18/2015  . Depression 4/03  . Diverticulosis   . Diverticulosis of colon without hemorrhage 10/07/2011  . Elevated blood pressure 07/24/2015  . GERD (gastroesophageal reflux disease)   . Granuloma of skin    RLL  . H/O measles   . H/O mumps   . Hearing loss    . History of chicken pox   . Hyperlipidemia   . Hypertension 07/24/2015  . Hypothyroidism   . Incontinence in female 02/06/2015  . Leg DVT (deep venous thromboembolism), acute, left ([place]) 03/23/2018  . Lumbar herniated disc   . Mediastinal adenopathy 03/23/2018  . Medicare annual wellness visit, subsequent 05/30/2016  . Nocturia 06/18/2015  . Osteopenia   . Tinnitus 07/24/2015  . Vitamin D deficiency     Past Surgical History:  Procedure Laterality Date  . ABDOMINAL HYSTERECTOMY  1980  . APPENDECTOMY    . BREAST SURGERY     L breast cyst  . CATARACT EXTRACTION  2010  . [place]  . KNEE SURGERY  2004   left knee  . TONSILLECTOMY AND ADENOIDECTOMY  1939    Social History   Socioeconomic History  . Marital status: Widowed    Spouse name: Not on file  . Number of children: 3  . Years of education: Not on file  . Highest education level: Not on file  Occupational History  . Not on file  Social Needs  . Financial resource strain: Not on file  . Food insecurity:    Worry: Not on file    Inability: Not on file  . Transportation needs:    Medical: Not on file    Non-medical: Not on file  Tobacco Use  . Smoking status: Former Smoker    Packs/day: 0.50  Years: 40.00    Pack years: 20.00    Types: Cigarettes    Start date: 06/09/1940    Last attempt to quit: 04/23/1979    Years since quitting: 39.3  . Smokeless tobacco: Never Used  . Tobacco comment: quit smoking 40 years ago  Substance and Sexual Activity  . Alcohol use: Yes    Alcohol/week: 0.0 standard drinks    Comment: socially  . Drug use: No  . Sexual activity: Not Currently    Birth control/protection: Post-menopausal    Comment: moved to [place], widowed. no dietary restrictions  Lifestyle  . Physical activity:    Days per week: Not on file    Minutes per session: Not on file  . Stress: Not on file  Relationships  . Social connections:    Talks on phone: Not on  file    Gets together: Not on file    Attends religious service: Not on file    Active member of club or organization: Not on file    Attends meetings of clubs or organizations: Not on file    Relationship status: Not on file  . Intimate partner violence:    Fear of current or ex partner: Not on file    Emotionally abused: Not on file    Physically abused: Not on file    Forced sexual activity: Not on file  Other Topics Concern  . Not on file  Social History Narrative  . Not on file      Allergies as of 08/03/2018      Reactions   Latex Itching   Lipitor [atorvastatin Calcium] Other (See Comments)   Myalgia   Tape Itching      Medication List       Accurate as of August 03, 2018  3:40 PM. Always use your most recent med list.        amLODipine 5 MG tablet Commonly known as:  NORVASC TAKE ONE (1) TABLET BY MOUTH EACH DAY   amLODipine 5 MG tablet Commonly known as:  NORVASC Take 1 tablet (5 mg total) by mouth daily.   apixaban 5 MG Tabs tablet Commonly known as:  Eliquis Take 1 tablet (5 mg total) by mouth 2 (two) times daily.   calcium-vitamin D 500-200 MG-UNIT tablet Commonly known as:  OSCAL WITH D Take 1 tablet by mouth.   levothyroxine 88 MCG tablet Commonly known as:  SYNTHROID, LEVOTHROID Take 88 mcg by mouth daily before breakfast.   polyethylene glycol 17 g packet Commonly known as:  MIRALAX / GLYCOLAX Take 17 g by mouth daily as needed for mild constipation.   psyllium 58.6 % packet Commonly known as:  METAMUCIL Take 1 packet by mouth daily.   senna 8.6 MG tablet Commonly known as:  SENOKOT Take 2 tablets by mouth as needed for constipation.           Objective:   Physical Exam There were no vitals taken for this visit. This was a telephone evaluation, I did notice some difficulty breathing, unable to speak in complete sentences    Assessment     85 year old female, PMH includes acute pulmonary emboli and DVT 03/2018, hypothyroidism,  incidental mediastinal adenopathy on CT, aortic sclerosis, HTN, evaluated today with  Shortness of breath, palpitations: The patient is 85 years old, she is not doing well lately, having shortness of breath and palpitations.  I explained her that she needs to be further evaluated today,  is hard to say what is  going on with out  a clinic exam, x-rays and blood work. She is understandably reluctant to go to the ER due to the coronavirus pandemia but that is what I am recommending. With pt's permission I call her daughter and left a message explaining her my advice. I also talked with Aldona Bar at the [facility] clinic, explained the situation stated that she will send somebody to see the patient and take care of her.   I will let PCP know     I discussed the assessment and treatment plan with the patient. The patient was provided an opportunity to ask questions and all were answered. The patient agreed with the plan and demonstrated an understanding of the instructions.   The patient was advised to call back or seek an in-person evaluation if the symptoms worsen or if the condition fails to improve as anticipated.  I provided 25 minutes of non-face-to-face time during this encounter.  Most of the time was with the patient but I also make a couple phone calls to coordinate her care.  Kathlene November, MD

## 2018-08-03 NOTE — ED Notes (Signed)
Pt and daughter verbalize understanding of dc instructions and deny any further needs at this time

## 2018-08-03 NOTE — Telephone Encounter (Signed)
FYI. Telephone visit at 3:40pm.

## 2018-08-03 NOTE — ED Triage Notes (Signed)
Pt with c/o cough, congestion, fever, hoarse voice-sx started Feb pt also c/o irregular HR which is her baseline-pt NAD-to triage with own cane-DOE

## 2018-08-03 NOTE — Discharge Instructions (Addendum)
Begin taking Claritin and doxycycline as prescribed.  You have been given the number for the oncology clinic at [facility].  Call this number to schedule an appointment to discuss the findings on your CT scan today.

## 2018-08-03 NOTE — Telephone Encounter (Signed)
See telephone evaluation.

## 2018-08-03 NOTE — Telephone Encounter (Signed)
She was seen in ER and it appears discharged. They found a mass in her lungs. See if she is willing to do a virtual visit with me later in the week to discuss her wishes regarding care and get her the meds and services she needs.

## 2018-08-03 NOTE — Telephone Encounter (Signed)
Thanks they found a lung mass, I will talk with her in next two days an arrange any services she needs

## 2018-08-04 ENCOUNTER — Telehealth: Payer: Self-pay | Admitting: Oncology

## 2018-08-04 ENCOUNTER — Telehealth: Payer: Self-pay | Admitting: Internal Medicine

## 2018-08-04 ENCOUNTER — Encounter: Payer: Self-pay | Admitting: Internal Medicine

## 2018-08-04 NOTE — Telephone Encounter (Signed)
A new patient appt has been scheduled for Kylie Walters to see Dr. Alen Blew on 4/16 at 2pm. Ms. Tamargo has been made aware to arrive 15 minutes early.

## 2018-08-04 NOTE — Telephone Encounter (Signed)
A new patient appt has been scheduled for the pt to see Dr. Julien Nordmann on 4/20 at 2:15pm w/labs at 1:45pm. Lft the appt date and time on the pt's vm. Letter mailed.

## 2018-08-05 ENCOUNTER — Telehealth: Payer: Self-pay | Admitting: Internal Medicine

## 2018-08-05 NOTE — Telephone Encounter (Signed)
Received a msg from [place], thoracic navigator, to schedule an appt for Kylie Walters to see Dr. Julien Nordmann on 4/20 at 2:15pm w/labs at 1:45pm. I attempted to call on 4/14. But was only able to lv a vm. I mailed the pt a letter w/the appt info and updated Hinton Dyer.

## 2018-08-06 ENCOUNTER — Encounter: Payer: Self-pay | Admitting: *Deleted

## 2018-08-06 ENCOUNTER — Telehealth: Payer: Self-pay

## 2018-08-06 NOTE — Progress Notes (Signed)
Oncology Nurse Navigator Documentation  Oncology Nurse Navigator Flowsheets 08/06/2018  Navigator Location [facility]  Barriers/Navigation Needs Coordination of Care/patient was referred here but does not want to be seen at this time.   Interventions Coordination of Care  Acuity Level 2  Time Spent with Patient 15

## 2018-08-06 NOTE — Telephone Encounter (Signed)
Patient called and stated she was in the ED 3 days ago and wanted you to take a look at her chart and let her know what you think. She states she feels better knowing what you would do.  Please advise

## 2018-08-06 NOTE — Telephone Encounter (Signed)
I saw that this patient was in Ed and I know I sent note to see if she wanted a visit with me. I reviewed her chart and the greatest concern is the mass in her chest. I can see she has chosen no heroic measures but is going to see oncology soon. I think a visit with oncology is probably a good idea. Just so she knows her options. I suspect the mass is contributing to her symptoms including the hoarseness. I am happy to do a phone or video visit if she wants to talk more.

## 2018-08-07 ENCOUNTER — Telehealth: Payer: Self-pay | Admitting: *Deleted

## 2018-08-07 ENCOUNTER — Telehealth: Payer: Self-pay | Admitting: Internal Medicine

## 2018-08-07 NOTE — Telephone Encounter (Signed)
Pt has cld wanting to schedule her appt with Dr. Julien Nordmann. She wanted to know if 4/20 was available and I told her it was. She's now scheduled to see Dr. Julien Nordmann on 4/20 at 2:15pm w/labs at 1:45pm.

## 2018-08-07 NOTE — Telephone Encounter (Signed)
Spoke with patient and let her know about the message noted below. She stated she had canceled her appt w/ oncology however after hearing what Dr. Charlett Blake has to say she states she will get in contact with them and reschedule her appt with them.

## 2018-08-07 NOTE — Telephone Encounter (Signed)
Received call from patient. She wishes to now schedule an appointment with Dr. Julien Nordmann. She had cancelled earlier , wishing to discuss with her PCP.  Please call her @ [phone] or her daughter, Sheryn Bison @ [phone]  Thank you

## 2018-08-10 ENCOUNTER — Inpatient Hospital Stay: Payer: Medicare Other | Attending: Internal Medicine | Admitting: Internal Medicine

## 2018-08-10 ENCOUNTER — Other Ambulatory Visit: Payer: Medicare Other

## 2018-08-10 ENCOUNTER — Other Ambulatory Visit: Payer: Self-pay

## 2018-08-10 ENCOUNTER — Inpatient Hospital Stay: Payer: Medicare Other

## 2018-08-10 ENCOUNTER — Ambulatory Visit: Payer: Medicare Other | Admitting: Internal Medicine

## 2018-08-10 ENCOUNTER — Encounter: Payer: Self-pay | Admitting: Internal Medicine

## 2018-08-10 VITALS — BP 126/59 | HR 92 | Temp 98.3°F | Resp 18 | Ht 63.0 in | Wt 132.4 lb

## 2018-08-10 DIAGNOSIS — Z86711 Personal history of pulmonary embolism: Secondary | ICD-10-CM

## 2018-08-10 DIAGNOSIS — R490 Dysphonia: Secondary | ICD-10-CM | POA: Diagnosis not present

## 2018-08-10 DIAGNOSIS — R634 Abnormal weight loss: Secondary | ICD-10-CM

## 2018-08-10 DIAGNOSIS — Z803 Family history of malignant neoplasm of breast: Secondary | ICD-10-CM

## 2018-08-10 DIAGNOSIS — Z8719 Personal history of other diseases of the digestive system: Secondary | ICD-10-CM

## 2018-08-10 DIAGNOSIS — F419 Anxiety disorder, unspecified: Secondary | ICD-10-CM

## 2018-08-10 DIAGNOSIS — C7801 Secondary malignant neoplasm of right lung: Secondary | ICD-10-CM | POA: Diagnosis not present

## 2018-08-10 DIAGNOSIS — Z8249 Family history of ischemic heart disease and other diseases of the circulatory system: Secondary | ICD-10-CM

## 2018-08-10 DIAGNOSIS — Z87891 Personal history of nicotine dependence: Secondary | ICD-10-CM

## 2018-08-10 DIAGNOSIS — Z8679 Personal history of other diseases of the circulatory system: Secondary | ICD-10-CM

## 2018-08-10 DIAGNOSIS — R918 Other nonspecific abnormal finding of lung field: Secondary | ICD-10-CM

## 2018-08-10 DIAGNOSIS — Z86718 Personal history of other venous thrombosis and embolism: Secondary | ICD-10-CM

## 2018-08-10 DIAGNOSIS — R630 Anorexia: Secondary | ICD-10-CM | POA: Diagnosis not present

## 2018-08-10 DIAGNOSIS — R5383 Other fatigue: Secondary | ICD-10-CM | POA: Diagnosis not present

## 2018-08-10 DIAGNOSIS — I700 Atherosclerosis of aorta: Secondary | ICD-10-CM

## 2018-08-10 DIAGNOSIS — Z841 Family history of disorders of kidney and ureter: Secondary | ICD-10-CM

## 2018-08-10 DIAGNOSIS — M79601 Pain in right arm: Secondary | ICD-10-CM | POA: Diagnosis not present

## 2018-08-10 DIAGNOSIS — Z7289 Other problems related to lifestyle: Secondary | ICD-10-CM

## 2018-08-10 DIAGNOSIS — Z72820 Sleep deprivation: Secondary | ICD-10-CM | POA: Diagnosis not present

## 2018-08-10 DIAGNOSIS — Z8659 Personal history of other mental and behavioral disorders: Secondary | ICD-10-CM

## 2018-08-10 DIAGNOSIS — Z8739 Personal history of other diseases of the musculoskeletal system and connective tissue: Secondary | ICD-10-CM

## 2018-08-10 DIAGNOSIS — Z8639 Personal history of other endocrine, nutritional and metabolic disease: Secondary | ICD-10-CM

## 2018-08-10 NOTE — Progress Notes (Signed)
[place] [phone]   [phone]  CONSULT NOTE  REFERRING PHYSICIAN: Dr. Willette Alma  REASON FOR CONSULTATION:  85 years old white female with suspicious lung cancer.  HPI ADEN YOUNGMAN is a 85 y.o. female with past medical history significant for multiple medical problems including history of pulmonary embolism, depression, hypertension, dyslipidemia, hypothyroidism, GERD, osteoarthritis and osteopenia.  The patient also has remote history for smoking but quit in 1980.  The patient mentions that in November 2019 she was complaining of shortness of breath and cough.  She presented to the hospital for evaluation and she had CT angiogram of the chest performed on March 22, 2018 and that showed acute pulmonary embolism with CT evidence of right heart strain consistent with at least submassive pulmonary embolism.  There was also new mediastinal adenopathy identified.  There was also subpleural nodular densities within the right upper lobe.  The patient was restarted on Eliquis for the pulmonary embolism.  In early February 2020 she started complaining of increasing shortness of breath.  As well as hoarseness of her voice.  That was initially thought to represent seasonal allergy.  Her symptoms were getting worse and the patient presented to the emergency department at the [facility].  She underwent chest x-ray followed by CT scan of the chest which showed small right pleural effusion.  There was also 4.9 x 4.5 cm area of masslike airspace disease peripherally in the right upper lobe with adjacent reticulonodular interstitial disease.  There was also multiple bilateral smaller pulmonary nodules.  The scan also showed mediastinal lymphadenopathy with the largest right upper paratracheal lymph node measuring 2.8 cm, partially calcified subcarinal lymph node measuring 2.2 cm, prevascular lymphadenopathy measuring approximately 2.2 cm, right  hilar adenopathy measuring 2.3 cm, left hilar adenopathy measuring up to 1.9 cm, infraclavicular lymphadenopathy 1.5 cm, right axillary lymph node 1.3 cm and subpectoral enlarged lymph node measuring 1.3 cm.  The scan also showed right adrenal mass measuring 3.9 x 2.1 cm and left adrenal mass measuring 1.8 x 2.8 cm. She was referred to me today for evaluation and recommendation regarding her condition. When seen today she is a little bit anxious.  She has lack of sleep and appetite with dry heaves and right arm aching pain.  She has shortness of breath with exertion.  She lost around 15 pounds in the last few months.  She denied having any headache or visual changes. Family history significant for mother with breast cancer, father had kidney disease and some died from cardiac condition at age 29. The patient is a widow.  Husband died from lung cancer more than 20 years ago.  She has 2 living children a daughter Baker Janus and son Jenny Reichmann. She is currently a resident of independent living facility [facility].  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  She has a history for smoking less than 1 pack/day for around 35 years and quit in 1980.  She has no history of alcohol or drug abuse.  HPI  Past Medical History:  Diagnosis Date   Acute pulmonary embolism ([place]) 03/23/2018   Aortic atherosclerosis ([place]) 03/23/2018   Arthritis    CTS (carpal tunnel syndrome) 06/18/2015   Depression 4/03   Diverticulosis    Diverticulosis of colon without hemorrhage 10/07/2011   Elevated blood pressure 07/24/2015   GERD (gastroesophageal reflux disease)    Granuloma of skin  RLL   H/O measles    H/O mumps    Hearing loss    History of chicken pox    Hyperlipidemia    Hypertension 07/24/2015   Hypothyroidism    Incontinence in female 02/06/2015   Leg DVT (deep venous thromboembolism), acute, left ([place]) 03/23/2018   Lumbar herniated disc    Mediastinal adenopathy  03/23/2018   Medicare annual wellness visit, subsequent 05/30/2016   Nocturia 06/18/2015   Osteopenia    Tinnitus 07/24/2015   Vitamin D deficiency     Past Surgical History:  Procedure Laterality Date   ABDOMINAL HYSTERECTOMY  1980   APPENDECTOMY     BREAST SURGERY     L breast cyst   CATARACT EXTRACTION  2010   DILATION AND CURETTAGE OF UTERUS  1954, 1978, [place] SURGERY  2004   left knee   TONSILLECTOMY AND ADENOIDECTOMY  1939    Family History  Problem Relation Age of Onset   Breast cancer Mother        metastatic disease   Heart disease Mother        Atrial fibrillation   Kidney disease Father        Bright's disease   Cancer Maternal Grandfather        prostate   Obesity Daughter    Thyroid disease Son    Hyperlipidemia Son     Social History Social History   Tobacco Use   Smoking status: Former Smoker    Packs/day: 0.50    Years: 40.00    Pack years: 20.00    Types: Cigarettes    Start date: 06/09/1940    Last attempt to quit: 04/23/1979    Years since quitting: 39.3   Smokeless tobacco: Never Used   Tobacco comment: quit smoking 40 years ago  Substance Use Topics   Alcohol use: Yes    Alcohol/week: 0.0 standard drinks    Comment: socially   Drug use: No    Allergies  Allergen Reactions   Latex Itching   Lipitor [Atorvastatin Calcium] Other (See Comments)    Myalgia    Tape Itching    Current Outpatient Medications  Medication Sig Dispense Refill   acetaminophen (TYLENOL) 500 MG tablet Take 500 mg by mouth every 6 (six) hours as needed.     amLODipine (NORVASC) 5 MG tablet Take 1 tablet (5 mg total) by mouth daily. 90 tablet 1   apixaban (ELIQUIS) 5 MG TABS tablet Take 1 tablet (5 mg total) by mouth 2 (two) times daily. 60 tablet 3   calcium-vitamin D (OSCAL WITH D) 500-200 MG-UNIT per tablet Take 1 tablet by mouth.     doxycycline (VIBRAMYCIN) 100 MG capsule Take 1 capsule (100 mg total) by mouth 2 (two) times  daily. One po bid x 7 days 14 capsule 0   levothyroxine (SYNTHROID, LEVOTHROID) 88 MCG tablet Take 88 mcg by mouth daily before breakfast.     loratadine (CLARITIN) 10 MG tablet Take 1 tablet (10 mg total) by mouth daily. 30 tablet 1   polyethylene glycol (MIRALAX / GLYCOLAX) packet Take 17 g by mouth daily as needed for mild constipation. 14 each 0   psyllium (METAMUCIL) 58.6 % packet Take 1 packet by mouth daily.     senna (SENOKOT) 8.6 MG tablet Take 2 tablets by mouth as needed for constipation.     No current facility-administered medications for this visit.     Review of Systems  Constitutional: positive for anorexia, fatigue  and weight loss Eyes: negative Ears, nose, mouth, throat, and face: positive for hoarseness Respiratory: positive for dyspnea on exertion Cardiovascular: negative Gastrointestinal: negative Genitourinary:negative Integument/breast: negative Hematologic/lymphatic: negative Musculoskeletal:negative Neurological: negative Behavioral/Psych: positive for anxiety and sleep disturbance Endocrine: negative Allergic/Immunologic: negative  Physical Exam  NGE:XBMWU, healthy, no distress, well nourished, well developed and anxious SKIN: skin color, texture, turgor are normal, no rashes or significant lesions HEAD: Normocephalic, No masses, lesions, tenderness or abnormalities EYES: normal, PERRLA, Conjunctiva are pink and non-injected EARS: External ears normal, Canals clear OROPHARYNX:no exudate, no erythema and lips, buccal mucosa, and tongue normal  NECK: supple, no adenopathy, no JVD LYMPH:  no palpable lymphadenopathy, no hepatosplenomegaly BREAST:not examined LUNGS: clear to auscultation , and palpation HEART: regular rate & rhythm, no murmurs and no gallops ABDOMEN:abdomen soft, non-tender, normal bowel sounds and no masses or organomegaly BACK: Back symmetric, no curvature., No CVA tenderness EXTREMITIES:no joint deformities, effusion, or  inflammation, no edema  NEURO: alert & oriented x 3 with fluent speech, no focal motor/sensory deficits  PERFORMANCE STATUS: ECOG 1  LABORATORY DATA: Lab Results  Component Value Date   WBC 7.5 08/03/2018   HGB 12.9 08/03/2018   HCT 42.2 08/03/2018   MCV 91.1 08/03/2018   PLT 293 08/03/2018      Chemistry      Component Value Date/Time   NA 138 08/03/2018 1854   K 3.2 (L) 08/03/2018 1854   CL 105 08/03/2018 1854   CO2 25 08/03/2018 1854   BUN 11 08/03/2018 1854   CREATININE 0.56 08/03/2018 1854   CREATININE 0.59 05/11/2014 1124      Component Value Date/Time   CALCIUM 9.3 08/03/2018 1854   ALKPHOS 55 07/11/2017 1026   AST 18 07/11/2017 1026   ALT 16 07/11/2017 1026   BILITOT 0.4 07/11/2017 1026       RADIOGRAPHIC STUDIES: Ct Chest Wo Contrast  Result Date: 08/03/2018 CLINICAL DATA:  Abnormal chest x-ray earlier today. EXAM: CT CHEST WITHOUT CONTRAST TECHNIQUE: Multidetector CT imaging of the chest was performed following the standard protocol without IV contrast. COMPARISON:  CT chest 03/22/2018 FINDINGS: Cardiovascular: No significant vascular findings. Normal heart size. No pericardial effusion. Thoracic aortic atherosclerosis. Coronary artery atherosclerosis. Mediastinum/Nodes: Mediastinal lymphadenopathy with the largest right upper paratracheal lymph node measuring 2.8 cm. Partially calcified subcarinal lymph node measuring 2.2 cm in short axis. Prevascular lymphadenopathy measuring approximately 2.2 cm in short axis. Right hilar lymphadenopathy measuring up to 2.3 cm in short axis. Left hilar lymphadenopathy measuring up to 1.9 cm in short axis. Infraclavicular lymphadenopathy measuring up to 15 mm in short axis. Right axillary lymphadenopathy measuring up to 13 mm in short axis. Subpectoral enlarged lymph node measuring 13 mm in short axis. Trachea, esophagus and thyroid gland are normal. Lungs/Pleura: Small right pleural effusion. 4.9 x 4.5 cm area of masslike area of  airspace disease peripherally in the right upper lobe with adjacent reticulonodular interstitial disease. 10 mm right upper lobe pulmonary nodule. Multiple other smaller bilateral upper lobe pulmonary nodules with the largest in the left upper lobe measuring 10 mm. Calcified right lower lobe pulmonary nodule unchanged consistent with prior granulomatous disease. Multiple bilateral lower lobe pulmonary nodules largest right lower lobe pulmonary nodule measuring 8 mm in short axis and largest left lower lobe pulmonary nodule measuring 8 mm in short axis. No pneumothorax. Upper Abdomen: Right adrenal mass measuring 3.9 x 2.1 cm. Left adrenal mass measures 1.8 x 2.8 cm. Musculoskeletal: No acute osseous abnormality. No aggressive osseous lesion. IMPRESSION:  1. Masslike area in the right upper lobe measuring up to 4.9 x 4.5 cm with ill-defined margins. Adjacent reticulonodular interstitial disease. Numerous bilateral upper and lower lung pulmonary nodules. Extensive mediastinal, bilateral hilar and to lesser extent right infraclavicular and right axillary lymphadenopathy. Bilateral adrenal masses. Overall appearance is most concerning for primary lung carcinoma with metastasis versus metastatic disease. Tissue diagnosis recommended. 2.  Aortic Atherosclerosis (ICD10-I70.0). Electronically Signed   By: Kathreen Devoid   On: 08/03/2018 19:59   Dg Chest Port 1 View  Result Date: 08/03/2018 CLINICAL DATA:  Cough and congestion with fevers, history of pulmonary emboli EXAM: PORTABLE CHEST 1 VIEW COMPARISON:  03/22/2018 FINDINGS: Cardiac shadow is at the upper limits of normal in size. Aortic calcifications are again seen. Globular calcifications are noted in the right lung base and stable. Lungs are well aerated bilaterally. A somewhat nodular density measuring approximately 4.5 cm is noted in the right upper lobe. This is increased in size significantly when compared with the prior exam. Given the recent history of  pulmonary embolus this could represent a pulmonary infarct without significant resolution. Possibility of enlarging mass however would deserve consideration as well. Old rib fractures are noted on the right. No other focal abnormality is seen. IMPRESSION: Enlarging masslike density in the right upper lobe when compared with the prior exam. Neoplasm must be a primary consideration although the possibility of unresolved pulmonary infarct deserves consideration as well. CT may be helpful for further evaluation. Electronically Signed   By: Inez Catalina M.D.   On: 08/03/2018 19:09    ASSESSMENT: This is a very pleasant 85 years old white female with likely metastatic lung cancer probably non-small cell carcinoma presented with right upper lobe lung mass in addition to mediastinal, hilar, supraclavicular and axillary lymphadenopathy as well as suspicious metastatic disease to the adrenal glands bilaterally, pending tissue diagnosis and further staging work-up.   PLAN: I had a lengthy discussion with the patient as well as her daughter Baker Janus by telephone after the visit today. I personally and independently reviewed the scan images and discussed the results with the patient and her daughter today. I explained to the patient that she is likely have stage IV lung cancer which is a terminal condition and all the treatment will be of palliative nature. I discussed with the patient the further investigation needed to confirm her diagnosis and treatment options. I gave the patient option of palliative care and hospice referral versus proceeding with a PET scan followed by biopsy of the right upper lobe lung mass or 1 of the hypermetabolic lymph nodes for confirmation of tissue diagnosis. If the patient has non-small cell lung cancer, adenocarcinoma, we may be able to send her biopsy for molecular studies and search for a targetable mutation that can be treated with oral targeted therapy. The patient indicated that she  would not be interested in systemic chemotherapy if needed so her only option would be treatment with targeted therapy if she has the molecular mutations or palliative care and hospice. The patient and her daughter will think about their options and call the office with their final decision. If she decided to go with palliative care and hospice she will contact her primary care physician Dr. Charlett Blake for referral to the hospice service. The patient was advised to call if she has any concerning symptoms in the interval. The patient voices understanding of current disease status and treatment options and is in agreement with the current care plan.  All questions  were answered. The patient knows to call the clinic with any problems, questions or concerns. We can certainly see the patient much sooner if necessary.  Thank you so much for allowing me to participate in the care of Sinclair Ship. I will continue to follow up the patient with you and assist in her care.  I spent 55 minutes counseling the patient face to face. The total time spent in the appointment was 80 minutes.  Disclaimer: This note was dictated with voice recognition software. Similar sounding words can inadvertently be transcribed and may not be corrected upon review.   Eilleen Kempf August 10, 2018, 2:22 PM

## 2018-08-11 ENCOUNTER — Telehealth: Payer: Self-pay | Admitting: Medical Oncology

## 2018-08-11 NOTE — Telephone Encounter (Signed)
Per Baker Janus , Pt wants to proceed with biopsy.

## 2018-08-17 ENCOUNTER — Telehealth: Payer: Self-pay | Admitting: *Deleted

## 2018-08-17 DIAGNOSIS — C3490 Malignant neoplasm of unspecified part of unspecified bronchus or lung: Secondary | ICD-10-CM

## 2018-08-17 NOTE — Telephone Encounter (Signed)
Oncology Nurse Navigator Documentation  Oncology Nurse Navigator Flowsheets 08/17/2018  Navigator Location [facility]  Navigator Encounter Type Telephone/I called patient to follow up on her wishes for treatment.  She would like to go ahead with biopsy.  Dr. Julien Nordmann is aware and he would like PET scan and then biopsy.  I explained this to patient.  She was thankful for the call.  Order for PET completed.   Telephone Outgoing Call  Treatment Phase Abnormal Scans  Barriers/Navigation Needs Education;Coordination of Care  Education Other  Interventions Coordination of Care;Education  Coordination of Care Other  Education Method Verbal  Acuity Level 2  Time Spent with Patient 30

## 2018-08-19 ENCOUNTER — Telehealth: Payer: Self-pay | Admitting: Medical Oncology

## 2018-08-19 ENCOUNTER — Other Ambulatory Visit: Payer: Self-pay | Admitting: Medical Oncology

## 2018-08-19 ENCOUNTER — Other Ambulatory Visit: Payer: Self-pay | Admitting: Internal Medicine

## 2018-08-19 DIAGNOSIS — C3490 Malignant neoplasm of unspecified part of unspecified bronchus or lung: Secondary | ICD-10-CM

## 2018-08-19 MED ORDER — OXYCODONE-ACETAMINOPHEN 5-325 MG PO TABS: 1.0000 | ORAL_TABLET | Freq: Three times a day (TID) | ORAL | 0 refills | Status: AC | PRN

## 2018-08-19 NOTE — Telephone Encounter (Signed)
err

## 2018-08-19 NOTE — Telephone Encounter (Signed)
R shoulder pain increasing in strength and frequency.  She is taking ES  tylenol 500 mg , 2 tabs every 6-8 hours .SHe is concerned about hte tylenol dose and it is not managing her pain.She states she has SOB with exertion and feels like her heart races. Dr Julien Nordmann ordered Percocet and ddtr,Gail. Marland Kitchen Jannifer Rodney and pt notified.  I asked Jannifer Rodney nurse to check on pt today.

## 2018-08-19 NOTE — Telephone Encounter (Signed)
Per Dr Julien Nordmann , He will order percocet for pt.

## 2018-08-19 NOTE — Telephone Encounter (Signed)
R shoulder pain increasing in strength and frequency. Taking Tylenol ES 500 mg- 2 tabs every 6-8 hours . Voice is hoarse. She said she  is taking too much tylenol.

## 2018-08-24 ENCOUNTER — Other Ambulatory Visit: Payer: Self-pay | Admitting: Family Medicine

## 2018-08-24 ENCOUNTER — Telehealth: Payer: Self-pay | Admitting: Family Medicine

## 2018-08-24 ENCOUNTER — Telehealth: Payer: Self-pay | Admitting: *Deleted

## 2018-08-24 DIAGNOSIS — R627 Adult failure to thrive: Secondary | ICD-10-CM

## 2018-08-24 NOTE — Telephone Encounter (Signed)
I have placed a referral to Hospice

## 2018-08-24 NOTE — Telephone Encounter (Signed)
Oncology Nurse Navigator Documentation  Oncology Nurse Navigator Flowsheets 08/24/2018  Navigator Location [facility]  Navigator Encounter Type Telephone/I updated Dr. Julien Nordmann on PET scan appt and he would like to see her after that to talk with her about her biopsy.  I called and spoke with patient.  She would like Hospice now and said her daughter was working on this.  I called the daughter to make sure she had called PCP with an update on request for Hospice.  I was unable to reach but did leave a vm message with my name and phone number to call.   Telephone Outgoing Call  Treatment Phase Abnormal Scans  Barriers/Navigation Needs Education;Coordination of Care  Education Other  Interventions Coordination of Care;Education  Coordination of Care Other  Education Method Verbal  Acuity Level 3  Time Spent with Patient 59

## 2018-08-24 NOTE — Telephone Encounter (Signed)
Patients daughter called to say patient has stopped eating and taking her medications. States they would like orders to place her in Hospice.

## 2018-08-24 NOTE — Telephone Encounter (Signed)
Per patients request I called her daughter but was unable to reach her. I did leave a vm message for her to call me.  I received a call back from Moore her daughter.  She updated me that patient would like to go to Hospice.  I updated her to call PCP today to get referral.  I looked up phone number for her.  I offered my condolences and asked if she had more questions to call me.  I then called radiology to cancel PET scan. I will update Dr. Julien Nordmann

## 2018-08-26 ENCOUNTER — Telehealth: Payer: Self-pay | Admitting: Licensed Clinical Social Worker

## 2018-08-26 ENCOUNTER — Telehealth: Payer: Self-pay | Admitting: *Deleted

## 2018-08-26 NOTE — Telephone Encounter (Signed)
Received Long Term Care FL2 Form for patient to be completed for placement into Nursing Home/Facility with [facility], completed as much as possible; forwarded to provider with attached med list, demographics Face Sheet, Insurance cards/SLS 05/06

## 2018-08-26 NOTE — Telephone Encounter (Signed)
Palliative Care SW spoke with patient and scheduled a virtual check-in visit for 11am on Thursday, 5/7.

## 2018-08-27 ENCOUNTER — Telehealth: Payer: Self-pay | Admitting: Family Medicine

## 2018-08-27 ENCOUNTER — Other Ambulatory Visit: Payer: Medicare Other | Admitting: *Deleted

## 2018-08-27 ENCOUNTER — Other Ambulatory Visit: Payer: Self-pay

## 2018-08-27 ENCOUNTER — Other Ambulatory Visit: Payer: Medicare Other | Admitting: Licensed Clinical Social Worker

## 2018-08-27 DIAGNOSIS — Z515 Encounter for palliative care: Secondary | ICD-10-CM

## 2018-08-27 NOTE — Telephone Encounter (Signed)
Copied from [place] [id number]. Topic: Quick Communication - See Telephone Encounter >> Aug 27, 2018  2:52 PM Rutherford Nail, NT wrote: CRM for notification. See Telephone encounter for: 08/27/18. Lisabeth Pick with [facility] calling and would like to know if Dr Charlett Blake would be the attending/signing physician for any orders they need? Please advise.  CB#: [phone]

## 2018-08-27 NOTE — Progress Notes (Signed)
COMMUNITY PALLIATIVE CARE SW NOTE  PATIENT NAME: MARAJADE Walters DOB: 06-29-1924 MRN: [id number]  PRIMARY CARE PROVIDER: Mosie Lukes, MD  RESPONSIBLE PARTY:  Acct ID - Guarantor Home Phone Work Phone Relationship Acct Type  [id number] Alecia [phone]  Self P/F     [address], [place],  [id number]   Due to the COVID-19 crisis, this virtual check-in visit was done via telephone from my office and it was initiated and consent given by thispatient.  PLAN OF CARE and INTERVENTIONS:             1. GOALS OF CARE/ ADVANCE CARE PLANNING:  Patient's goal is to have a natural death with no pain.  She has a DNR. 2. SOCIAL/EMOTIONAL/SPIRITUAL ASSESSMENT/ INTERVENTIONS:  SW and the Palliative Care RN, Daryl Eastern, conducted a virtual check-in visit with patient in her home.  She stated she was moving to  [facility].  She currently lives in an apartment on the campus.  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  She would like to spend her last days of life in [facility].  SW provided active listening and supportive counseling. 3. PATIENT/CAREGIVER EDUCATION/ COPING:  Provided education regarding the Palliative Care program. 4. PERSONAL EMERGENCY PLAN:  Facility will contact EMS. 5. COMMUNITY RESOURCES COORDINATION/ HEALTH CARE NAVIGATION:  None. 6. FINANCIAL/LEGAL CONCERNS/INTERVENTIONS:  None.     SOCIAL HX:  Social History   Tobacco Use  . Smoking status: Former Smoker    Packs/day: 0.50    Years: 40.00    Pack years: 20.00    Types: Cigarettes    Start date: 06/09/1940    Last attempt to quit: 04/23/1979    Years since quitting: 39.3  . Smokeless tobacco: Never Used  . Tobacco comment: quit smoking 40 years ago  Substance Use Topics  . Alcohol use: Yes    Alcohol/week: 0.0 standard drinks    Comment: socially    CODE STATUS:  DNR  ADVANCED DIRECTIVES: N MOST FORM COMPLETE:  N HOSPICE EDUCATION PROVIDED: Yes Duration of visit and  documentation:  45 minutes.      Creola Corn Rontavious Albright, LCSW

## 2018-08-27 NOTE — Telephone Encounter (Signed)
[facility] facility called again checking in on status of FL2 form.

## 2018-08-28 NOTE — Telephone Encounter (Signed)
Debra from [facility] calling requesting FL2.

## 2018-08-28 NOTE — Telephone Encounter (Signed)
Pts daughter called stating they are still waiting on the [facility] form. She states pt cannot receive care from hospice without this for because she cant be moved to nursing unit. Please advise.

## 2018-08-29 NOTE — Telephone Encounter (Signed)
Kylie Walters called back to the 24hr nurse line. The nurse called me at [facility] stating that Kylie Walters would like a call back regarding this situation. When I called back it went to voicemail so will route this to PCP to handle on Monday

## 2018-08-30 NOTE — Telephone Encounter (Signed)
Yes happy to be the attending for the patient in [place].

## 2018-08-31 DIAGNOSIS — I2699 Other pulmonary embolism without acute cor pulmonale: Secondary | ICD-10-CM | POA: Diagnosis not present

## 2018-08-31 DIAGNOSIS — Z9181 History of falling: Secondary | ICD-10-CM | POA: Diagnosis not present

## 2018-08-31 DIAGNOSIS — E039 Hypothyroidism, unspecified: Secondary | ICD-10-CM | POA: Diagnosis not present

## 2018-08-31 DIAGNOSIS — C3491 Malignant neoplasm of unspecified part of right bronchus or lung: Secondary | ICD-10-CM | POA: Diagnosis not present

## 2018-08-31 DIAGNOSIS — I10 Essential (primary) hypertension: Secondary | ICD-10-CM | POA: Diagnosis not present

## 2018-08-31 DIAGNOSIS — R627 Adult failure to thrive: Secondary | ICD-10-CM | POA: Diagnosis not present

## 2018-08-31 DIAGNOSIS — R2689 Other abnormalities of gait and mobility: Secondary | ICD-10-CM | POA: Diagnosis not present

## 2018-08-31 DIAGNOSIS — M6281 Muscle weakness (generalized): Secondary | ICD-10-CM | POA: Diagnosis not present

## 2018-08-31 DIAGNOSIS — C3490 Malignant neoplasm of unspecified part of unspecified bronchus or lung: Secondary | ICD-10-CM | POA: Diagnosis not present

## 2018-08-31 NOTE — Telephone Encounter (Signed)
Received [facility] Telephone Record. Kylie Walters with [facility] regarding Dr. Rhae Lerner' ok to be attending for patient.

## 2018-09-02 ENCOUNTER — Ambulatory Visit (HOSPITAL_COMMUNITY): Payer: Medicare Other

## 2018-09-04 NOTE — Progress Notes (Signed)
COMMUNITY PALLIATIVE CARE RN NOTE  PATIENT NAME: Kylie Walters DOB: [birth year] MRN: [id number]  PRIMARY CARE PROVIDER: Mosie Lukes, MD  RESPONSIBLE PARTY:  Acct ID - Guarantor Home Phone Work Phone Relationship Acct Type  [id number] Alecia [phone]  Self P/F     [address], [place], [place] [address]   Due to the COVID-19 crisis, this virtual check-in visit was done via telephone from my office and it was initiated and consent by this patient and or family.  PLAN OF CARE and INTERVENTION:  1. ADVANCE CARE PLANNING/GOALS OF CARE: Goal is for patient to have a natural death without pain. She is a DNR. 2. PATIENT/CAREGIVER EDUCATION: Explained Palliative Care vs Hospice 3. DISEASE STATUS: Joint virtual check in visit completed with Palliative Care SW, Lynn Duffy. Spoke with patient via telephone. She currently resides at [facility] in an [place] apartment. 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 She takes Tylenol 500 mg TID and Percocet 1-2 tablets per day. She always takes a Percocet at bedtime and states that she is able to sleep throughout the night w/o pain. She reports that her breathing is worsening. She says she was on Oxygen in the hospital and feels she needs this in the home as well. Her Oxygen saturation is 96-97% on RA, but she says it feels much lower. She ambulates using her walker. She is able to dress herself, however states she is no longer taking showers because she does not trust herself. She states that she is moving to the Skilled unit this evening she "thinks" because she needs more assistance but is not sure. Answered several questions regarding hospice care and patient states that at end of life she would like to be transferred to [facility]. Found out from our Referral Center today that a hospice referral has been sent and they are currently working on getting a hospice Admission nurse visit scheduled.   HISTORY OF  PRESENT ILLNESS:  This is a 85 yo female who resides at [facility] in an [place] apartment. Palliative Care Team was asked to follow patient to assist with symptom management and goals of care.   CODE STATUS: DNR ADVANCED DIRECTIVES: N MOST FORM: no PPS: 40%   (Duration of visit and documentation 60 minutes)   Daryl Eastern, RN BSN

## 2018-09-23 ENCOUNTER — Telehealth: Payer: Self-pay | Admitting: Licensed Clinical Social Worker

## 2018-09-23 NOTE — Telephone Encounter (Signed)
Palliative Care SW left a vm for Dorene Sorrow, SW at [facility].  Patient stated previously that she was moving from her apartment at [facility] to the SNF facility, and SW wished to verify.

## 2018-10-21 DEATH — deceased

## 2018-11-19 ENCOUNTER — Other Ambulatory Visit: Payer: Self-pay

## 2019-04-14 IMAGING — CT CT ANGIO CHEST
2 of 7 series · 16 of 36 positions shown · IV contrast (iopamidol)
Comparison: CT chest 05/11/2014

CLINICAL DATA: Short of breath for several days.  Elevated D-dimer.

EXAM:
CT ANGIOGRAPHY CHEST WITH CONTRAST
TECHNIQUE: Multidetector CT imaging of the chest was performed using the
standard protocol during bolus administration of intravenous
contrast. Multiplanar CT image reconstructions and MIPs were
obtained to evaluate the vascular anatomy.
CONTRAST:  75mL ZTU16Y-D8S IOPAMIDOL (ZTU16Y-D8S) INJECTION 76%

[Series 7: pe thins · axial · 0.80mm/px · z∈[-202,+45]mm · 15 of 405 slices shown]
[im 26/405  lung]
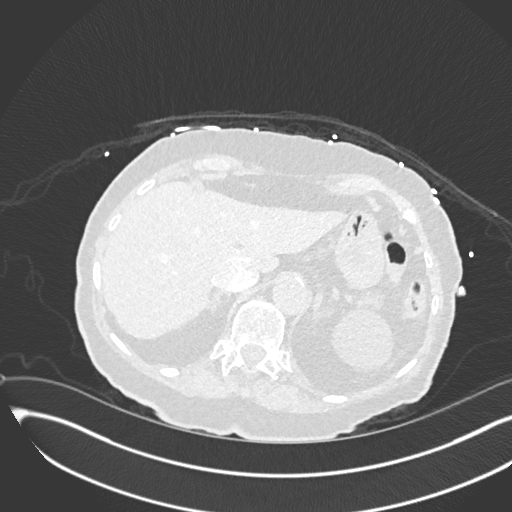
[im 51/405  mediastinal]
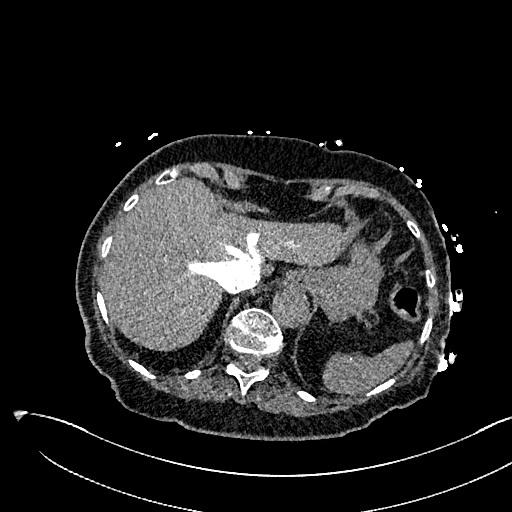
[im 76/405  lung]
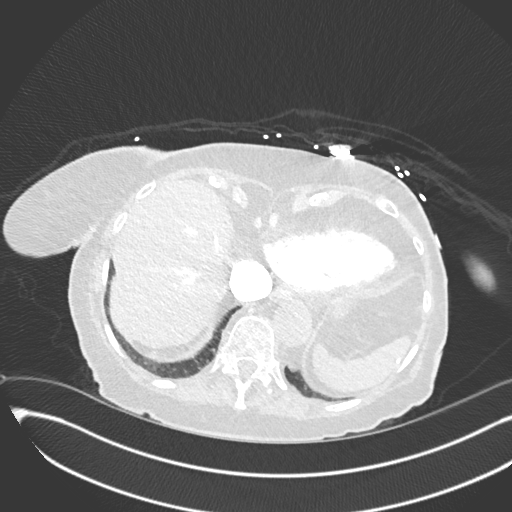
[im 102/405  mediastinal]
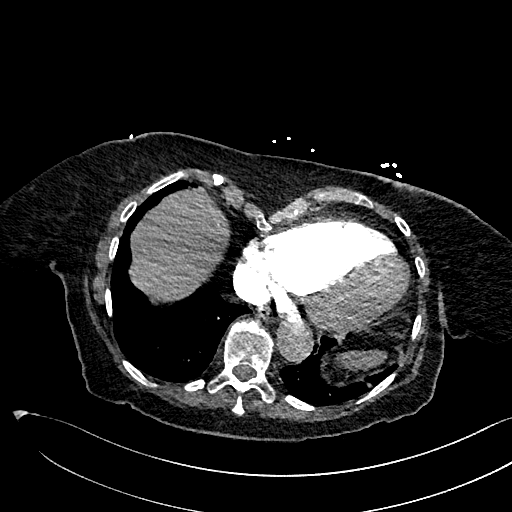
[im 127/405  lung]
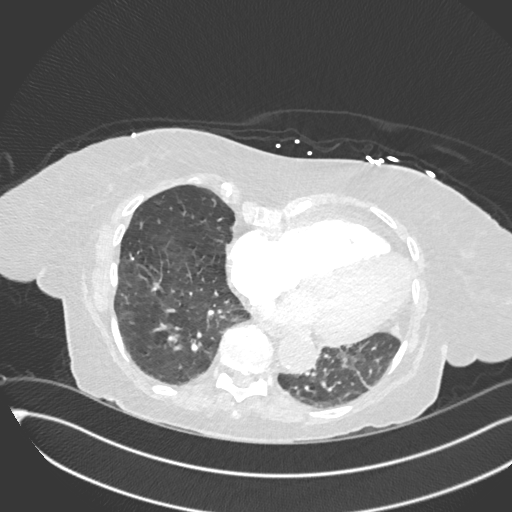
[im 152/405  mediastinal]
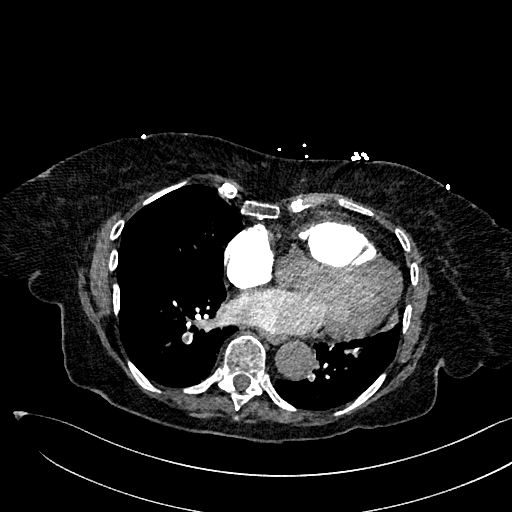
[im 177/405  lung]
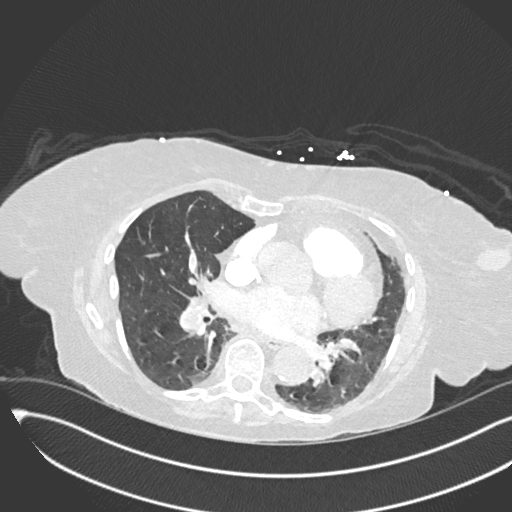
[im 203/405  mediastinal]
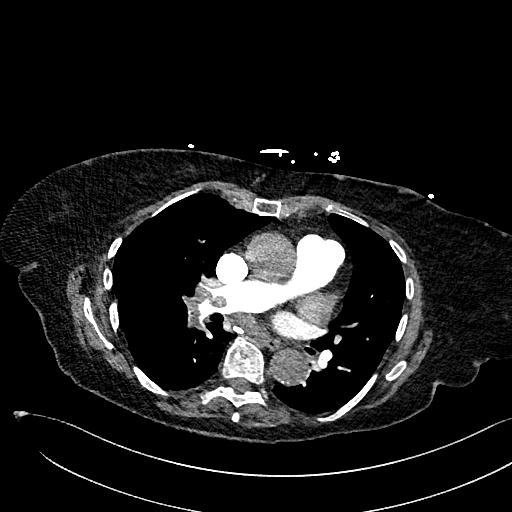
[im 228/405  lung]
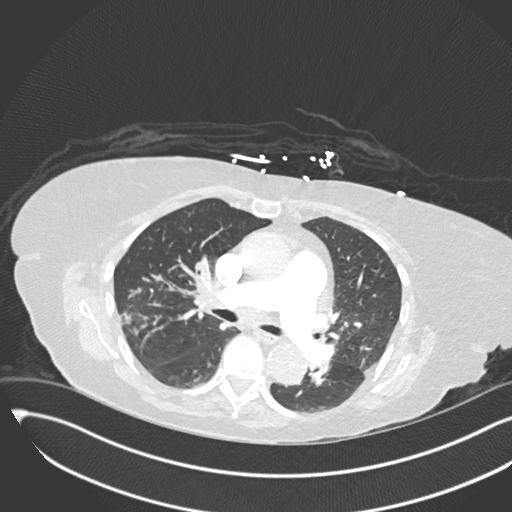
[im 253/405  mediastinal]
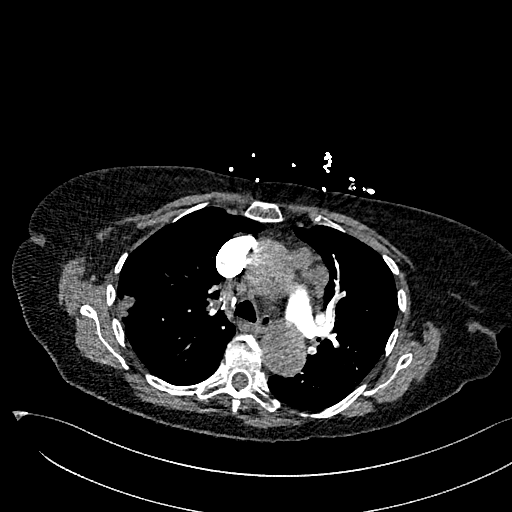
[im 278/405  lung]
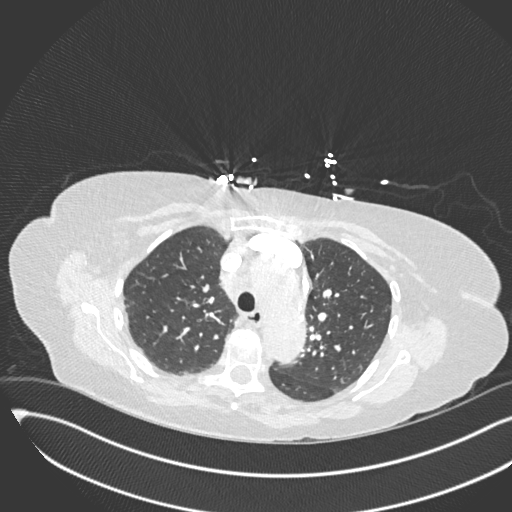
[im 304/405  mediastinal]
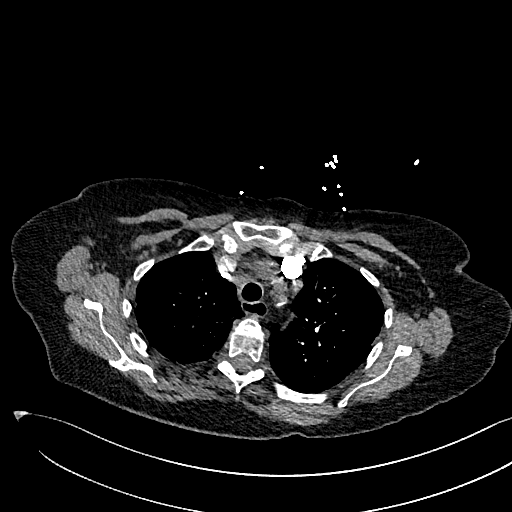
[im 329/405  lung]
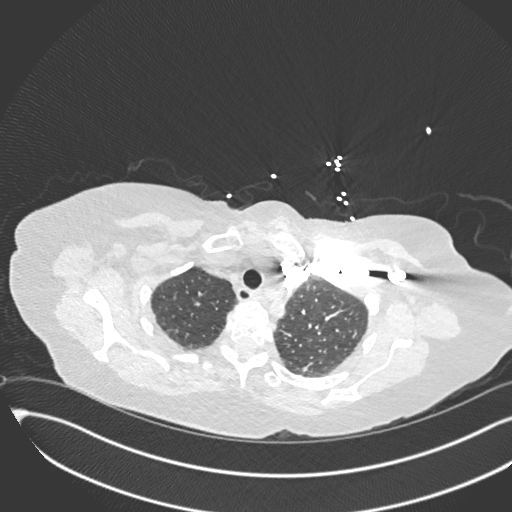
[im 354/405  mediastinal]
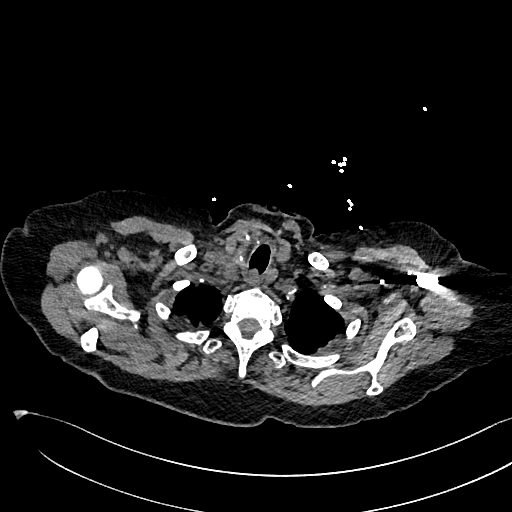
[im 379/405  lung]
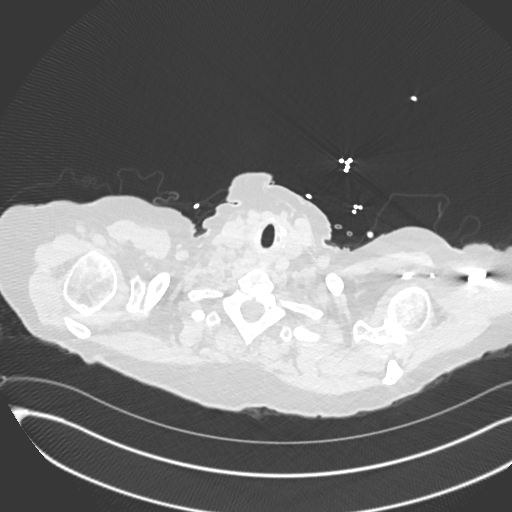

[Series 8: pe 2mm cor · coronal · 0.59mm/px · 1 of 144 slices shown]
[im 72/144  mediastinal]
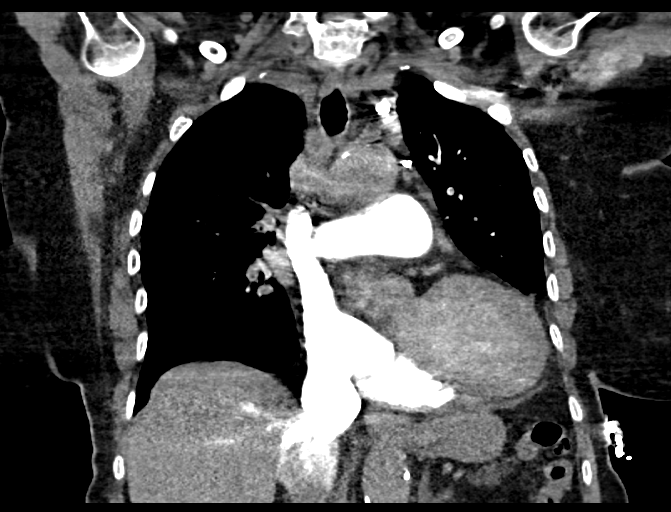

[16 of 36 positions shown; findings below may reference images not displayed]

FINDINGS: Cardiovascular: Normal heart size. Aortic atherosclerosis.
Calcifications in the LAD, left circumflex and RCA coronary arteries
noted. Large bilateral central obstructing pulmonary emboli are
identified. Pulmonary emboli are identified within the right upper
lobe, right middle lobe and right lower lobe lobar and segmental
pulmonary arteries. Similarly there are upper and lower and lingular
lobar pulmonary artery filling defects on the left. The RV to LV
ratio is equal to 5.3/3.8 = 1.39 consistent with right heart strain.
Reflux of contrast material into the IVC and hepatic veins
reflecting increased right heart pressures.

Mediastinum/Nodes: Mediastinal adenopathy is identified, new from
previous exam. Left pre-vascular lymph node measures 1.5 cm, image
53/5. Sub-carinal lymph node measures 1.9 cm, image 69/5. Right
paratracheal lymph node measures 1.7 cm, image 48/5. Prominent right
supraclavicular lymph nodes identified measuring up to 1.1 cm. No
axillary adenopathy.

Lungs/Pleura: Calcified granuloma identified in the right lower
lobe. Subpleural nodular consolidation within the lateral right
upper lobe is identified, image 56/6. Stable solid round nodule in
the right middle lobe measuring 5 mm, image 85/6.

Upper Abdomen: No acute abnormality.

Musculoskeletal: No chest wall abnormality. No acute or significant
osseous findings.

Review of the MIP images confirms the above findings.
IMPRESSION: 1. Positive for acute PE with CT evidence of right heart strain
(RV/LV Ratio = 1.39) consistent with at least submassive
(intermediate risk) PE. The presence of right heart strain has been
associated with an increased risk of morbidity and mortality. Please
activate Code PE by paging [phone].
2. New mediastinal adenopathy identified compared with 05/11/2014.
Etiology indeterminate. Findings may reflect lymphoproliferative
disorder, metastatic disease, granulomatous inflammation or
infection. Followup imaging in 3 months is recommended. This
recommendation follows ACR consensus guidelines: Managing Incidental
Findings on Thoracic CT: Mediastinal and Cardiovascular Findings. A
White Paper of the ACR Incidental Findings Committee. [HOSPITAL]. 0886; 15: 6366-6317.
3. Subpleural nodular densities within the right upper lobe are
identified. These may represent areas of subpleural infarct
secondary to pulmonary emboli. Attention on follow-up imaging is
recommended.
4.  Aortic Atherosclerosis ([id number].S).
5. Coronary artery atherosclerotic calcifications.

Critical Value/emergent results were called by telephone at the time
of interpretation on 03/22/2018 at [DATE] to Dr. RAMON JING ,
who verbally acknowledged these results.

## 2019-05-11 ENCOUNTER — Telehealth: Payer: Self-pay | Admitting: *Deleted

## 2019-05-11 NOTE — Telephone Encounter (Signed)
Unable to reach by phone

## 2019-05-20 ENCOUNTER — Telehealth: Payer: Self-pay | Admitting: *Deleted

## 2019-05-20 NOTE — Telephone Encounter (Signed)
A message was left on the daughter voicemail,re: a follow up visit with Dr.Christopher.
